# Patient Record
Sex: Male | Born: 1960 | Race: White | Hispanic: No | Marital: Married | Smoking: Former smoker
Health system: Southern US, Community
[De-identification: ages and names within clinical notes are randomized; demographics above are authoritative.]

## PROBLEM LIST (undated history)

## (undated) DIAGNOSIS — I10 Essential (primary) hypertension: Secondary | ICD-10-CM

## (undated) DIAGNOSIS — E785 Hyperlipidemia, unspecified: Secondary | ICD-10-CM

## (undated) DIAGNOSIS — E781 Pure hyperglyceridemia: Secondary | ICD-10-CM

## (undated) DIAGNOSIS — I2510 Atherosclerotic heart disease of native coronary artery without angina pectoris: Secondary | ICD-10-CM

## (undated) DIAGNOSIS — Z87442 Personal history of urinary calculi: Secondary | ICD-10-CM

## (undated) DIAGNOSIS — Z720 Tobacco use: Secondary | ICD-10-CM

## (undated) DIAGNOSIS — E119 Type 2 diabetes mellitus without complications: Secondary | ICD-10-CM

## (undated) DIAGNOSIS — F1210 Cannabis abuse, uncomplicated: Secondary | ICD-10-CM

## (undated) HISTORY — DX: Hyperlipidemia, unspecified: E78.5

## (undated) HISTORY — PX: ANGIOPLASTY: SHX39

## (undated) HISTORY — DX: Pure hyperglyceridemia: E78.1

## (undated) HISTORY — DX: Tobacco use: Z72.0

## (undated) HISTORY — DX: Atherosclerotic heart disease of native coronary artery without angina pectoris: I25.10

## (undated) HISTORY — PX: CORONARY ANGIOPLASTY: SHX604

---

## 2002-08-17 ENCOUNTER — Encounter: Payer: Self-pay | Admitting: Family Medicine

## 2002-08-17 ENCOUNTER — Ambulatory Visit (HOSPITAL_COMMUNITY): Admission: RE | Admit: 2002-08-17 | Discharge: 2002-08-17 | Payer: Self-pay | Admitting: Family Medicine

## 2003-02-03 ENCOUNTER — Encounter: Payer: Self-pay | Admitting: *Deleted

## 2003-02-03 ENCOUNTER — Emergency Department (HOSPITAL_COMMUNITY): Admission: EM | Admit: 2003-02-03 | Discharge: 2003-02-03 | Payer: Self-pay | Admitting: *Deleted

## 2003-02-05 ENCOUNTER — Ambulatory Visit (HOSPITAL_COMMUNITY): Admission: RE | Admit: 2003-02-05 | Discharge: 2003-02-05 | Payer: Self-pay | Admitting: Family Medicine

## 2003-02-05 ENCOUNTER — Encounter: Payer: Self-pay | Admitting: Family Medicine

## 2003-02-18 ENCOUNTER — Encounter (HOSPITAL_COMMUNITY): Admission: RE | Admit: 2003-02-18 | Discharge: 2003-03-20 | Payer: Self-pay | Admitting: Family Medicine

## 2003-03-22 ENCOUNTER — Encounter (HOSPITAL_COMMUNITY): Admission: RE | Admit: 2003-03-22 | Discharge: 2003-04-21 | Payer: Self-pay | Admitting: Family Medicine

## 2003-07-15 ENCOUNTER — Encounter
Admission: RE | Admit: 2003-07-15 | Discharge: 2003-10-13 | Payer: Self-pay | Admitting: Physical Medicine & Rehabilitation

## 2003-11-13 HISTORY — PX: LUMBAR SPINE SURGERY: SHX701

## 2003-11-13 HISTORY — PX: CORONARY STENT PLACEMENT: SHX1402

## 2004-03-15 ENCOUNTER — Inpatient Hospital Stay (HOSPITAL_COMMUNITY): Admission: RE | Admit: 2004-03-15 | Discharge: 2004-03-18 | Payer: Self-pay | Admitting: Neurosurgery

## 2004-04-05 ENCOUNTER — Encounter (HOSPITAL_COMMUNITY): Admission: RE | Admit: 2004-04-05 | Discharge: 2004-05-05 | Payer: Self-pay | Admitting: Neurosurgery

## 2004-05-02 ENCOUNTER — Encounter: Admission: RE | Admit: 2004-05-02 | Discharge: 2004-05-02 | Payer: Self-pay | Admitting: Neurosurgery

## 2004-05-08 ENCOUNTER — Encounter (HOSPITAL_COMMUNITY): Admission: RE | Admit: 2004-05-08 | Discharge: 2004-06-07 | Payer: Self-pay | Admitting: Neurosurgery

## 2004-06-06 ENCOUNTER — Emergency Department (HOSPITAL_COMMUNITY): Admission: EM | Admit: 2004-06-06 | Discharge: 2004-06-06 | Payer: Self-pay | Admitting: Emergency Medicine

## 2004-06-08 ENCOUNTER — Encounter (HOSPITAL_COMMUNITY): Admission: RE | Admit: 2004-06-08 | Discharge: 2004-07-08 | Payer: Self-pay | Admitting: Neurosurgery

## 2004-06-12 ENCOUNTER — Encounter: Payer: Self-pay | Admitting: Emergency Medicine

## 2004-06-12 ENCOUNTER — Inpatient Hospital Stay (HOSPITAL_COMMUNITY): Admission: AD | Admit: 2004-06-12 | Discharge: 2004-06-14 | Payer: Self-pay | Admitting: Cardiology

## 2004-06-29 ENCOUNTER — Encounter (HOSPITAL_COMMUNITY): Admission: RE | Admit: 2004-06-29 | Discharge: 2004-07-29 | Payer: Self-pay | Admitting: *Deleted

## 2004-07-18 ENCOUNTER — Ambulatory Visit (HOSPITAL_COMMUNITY): Admission: RE | Admit: 2004-07-18 | Discharge: 2004-07-18 | Payer: Self-pay | Admitting: *Deleted

## 2004-07-31 ENCOUNTER — Encounter (HOSPITAL_COMMUNITY): Admission: RE | Admit: 2004-07-31 | Discharge: 2004-08-11 | Payer: Self-pay | Admitting: *Deleted

## 2004-08-14 ENCOUNTER — Encounter (HOSPITAL_COMMUNITY): Admission: RE | Admit: 2004-08-14 | Discharge: 2004-09-13 | Payer: Self-pay | Admitting: *Deleted

## 2004-09-15 ENCOUNTER — Encounter (HOSPITAL_COMMUNITY): Admission: RE | Admit: 2004-09-15 | Discharge: 2004-10-15 | Payer: Self-pay | Admitting: *Deleted

## 2004-11-16 ENCOUNTER — Encounter: Admission: RE | Admit: 2004-11-16 | Discharge: 2004-11-16 | Payer: Self-pay | Admitting: Neurosurgery

## 2004-12-12 ENCOUNTER — Ambulatory Visit: Payer: Self-pay

## 2005-02-02 ENCOUNTER — Encounter
Admission: RE | Admit: 2005-02-02 | Discharge: 2005-05-03 | Payer: Self-pay | Admitting: Physical Medicine & Rehabilitation

## 2005-02-06 ENCOUNTER — Ambulatory Visit: Payer: Self-pay | Admitting: Physical Medicine & Rehabilitation

## 2005-02-27 ENCOUNTER — Encounter (HOSPITAL_COMMUNITY): Admission: RE | Admit: 2005-02-27 | Discharge: 2005-03-29 | Payer: Self-pay | Admitting: Neurosurgery

## 2005-03-08 ENCOUNTER — Ambulatory Visit: Payer: Self-pay | Admitting: Cardiology

## 2005-03-13 ENCOUNTER — Ambulatory Visit: Payer: Self-pay | Admitting: *Deleted

## 2005-03-30 ENCOUNTER — Encounter (HOSPITAL_COMMUNITY)
Admission: RE | Admit: 2005-03-30 | Discharge: 2005-04-29 | Payer: Self-pay | Admitting: Physical Medicine & Rehabilitation

## 2005-04-12 ENCOUNTER — Ambulatory Visit: Payer: Self-pay | Admitting: Physical Medicine & Rehabilitation

## 2005-05-03 ENCOUNTER — Ambulatory Visit (HOSPITAL_COMMUNITY): Admission: RE | Admit: 2005-05-03 | Discharge: 2005-05-03 | Payer: Self-pay | Admitting: Family Medicine

## 2005-05-21 ENCOUNTER — Ambulatory Visit: Payer: Self-pay | Admitting: Physical Medicine & Rehabilitation

## 2005-05-21 ENCOUNTER — Encounter
Admission: RE | Admit: 2005-05-21 | Discharge: 2005-08-19 | Payer: Self-pay | Admitting: Physical Medicine & Rehabilitation

## 2005-05-29 ENCOUNTER — Ambulatory Visit: Payer: Self-pay | Admitting: Cardiology

## 2005-08-31 ENCOUNTER — Ambulatory Visit: Payer: Self-pay | Admitting: *Deleted

## 2006-09-13 ENCOUNTER — Ambulatory Visit: Payer: Self-pay | Admitting: Cardiovascular Disease

## 2007-12-17 ENCOUNTER — Ambulatory Visit: Payer: Self-pay | Admitting: Cardiovascular Disease

## 2010-01-12 ENCOUNTER — Telehealth (INDEPENDENT_AMBULATORY_CARE_PROVIDER_SITE_OTHER): Payer: Self-pay | Admitting: *Deleted

## 2010-02-13 DIAGNOSIS — E781 Pure hyperglyceridemia: Secondary | ICD-10-CM | POA: Insufficient documentation

## 2010-02-13 DIAGNOSIS — F17200 Nicotine dependence, unspecified, uncomplicated: Secondary | ICD-10-CM | POA: Insufficient documentation

## 2010-02-13 DIAGNOSIS — E785 Hyperlipidemia, unspecified: Secondary | ICD-10-CM

## 2010-02-13 DIAGNOSIS — I219 Acute myocardial infarction, unspecified: Secondary | ICD-10-CM | POA: Insufficient documentation

## 2010-02-13 DIAGNOSIS — I2510 Atherosclerotic heart disease of native coronary artery without angina pectoris: Secondary | ICD-10-CM

## 2010-02-17 ENCOUNTER — Ambulatory Visit: Payer: Self-pay | Admitting: Cardiovascular Disease

## 2010-02-17 DIAGNOSIS — I10 Essential (primary) hypertension: Secondary | ICD-10-CM | POA: Insufficient documentation

## 2010-06-19 ENCOUNTER — Telehealth (INDEPENDENT_AMBULATORY_CARE_PROVIDER_SITE_OTHER): Payer: Self-pay

## 2010-10-19 ENCOUNTER — Encounter (INDEPENDENT_AMBULATORY_CARE_PROVIDER_SITE_OTHER): Payer: Self-pay | Admitting: *Deleted

## 2010-12-12 NOTE — Progress Notes (Signed)
Summary: refill  Medications Added SIMVASTATIN 40 MG TABS (SIMVASTATIN) Take one tablet by mouth daily at bedtime       Phone Note Call from Patient   Caller: Patient Reason for Call: Refill Medication Summary of Call: pt wants refill for Metoprolol 25mg  and Simvastatin 40mg  called to [facility] in [place]/pt past due for f/u/given appt. for 02/17/10/tg Initial call taken by: Raechel Ache [facility],  January 12, 2010 2:56 PM    New/Updated Medications: SIMVASTATIN 40 MG TABS (SIMVASTATIN) Take one tablet by mouth daily at bedtime Prescriptions: METOPROLOL TARTRATE 25 MG TABS (METOPROLOL TARTRATE) Take one tablet by mouth twice a day  #60 x 1   Entered by:   Teressa Lower RN   Authorized by:   Colon Branch, MD, [facility]   Signed by:   Teressa Lower RN on 01/12/2010   Method used:   Electronically to        [facility]  [address] (retail)       [address]       [place], [place]  [address]       Ph: [phone]       Fax: [phone]   RxID:   [id number] SIMVASTATIN 40 MG TABS (SIMVASTATIN) Take one tablet by mouth daily at bedtime  #30 x 1   Entered by:   Teressa Lower RN   Authorized by:   Colon Branch, MD, [facility]   Signed by:   Teressa Lower RN on 01/12/2010   Method used:   Electronically to        [facility]  [address] (retail)       [address]       [place], [place]  [address]       Ph: [phone]       Fax: [phone]   RxID:   [id number] METOPROLOL TARTRATE 25 MG TABS (METOPROLOL TARTRATE) Take one tablet by mouth twice a day  #60 x 1   Entered by:   Teressa Lower RN   Authorized by:   Colon Branch, MD, [facility]   Signed by:   Teressa Lower RN on 01/12/2010   Method used:   Print then Give to Patient   RxID:   [id number]

## 2010-12-12 NOTE — Letter (Signed)
Summary: Appointment - Reminder 2  [facility] [place]. [address] [place] [address]   Phone: [phone]  Fax: [phone]     October 19, 2010 MRN: [id number]   DURWOOD DITTUS [address] [place], [place]  [address]   Dear Mr. STROTHMAN,  Our records indicate that it is time to schedule a follow-up appointment.  Dr.   Eden Emms       recommended that you follow up with [place] in   08/2010 PAST DUE         . It is very important that we reach you to schedule this appointment. We look forward to participating in your health care needs. Please contact us at the number listed above at your earliest convenience to schedule your appointment.  If you are unable to make an appointment at this time, give [place] a call so we can update our records.     Sincerely,   Glass blower/designer

## 2010-12-12 NOTE — Progress Notes (Signed)
**Note De-Identified George [name] Obfuscation** Summary: refills-Simvastatin and Metoprolol   Phone Note Call from Patient   Reason for Call: Refill Medication Summary of Call: NEEDS REFILL ON MEDICATIONS: SIMVASTAIN 40MG ; METOPROLOT 50 MG X2 DAILY. HE FELLS LIKE [facility] IS NOT RESPONDING TO HIS REQUEST FOR THE REFILLS. HE IS OUT OF THE MEDICATION. YOU CAN CALL HIM BACK AT [phone] Initial call taken by: Alexis Goodell    Prescriptions: METOPROLOL TARTRATE 25 MG TABS (METOPROLOL TARTRATE) Take one tablet by mouth twice a day  #60 x 2   Entered by:   Larita Fife Jalayne Ganesh LPN   Authorized by:   Colon Branch, MD, [facility]   Signed by:   Larita Fife Lulla Linville LPN on 09/81/1914   Method used:   Electronically to        [facility]  [address] (retail)       [address]       [place], [place]  [address]       Ph: [phone]       Fax: [phone]   RxID:   [id number] SIMVASTATIN 40 MG TABS (SIMVASTATIN) Take one tablet by mouth daily at bedtime  #30 x 2   Entered by:   Larita Fife Lanika Colgate LPN   Authorized by:   Colon Branch, MD, [facility]   Signed by:   Larita Fife Tyrea Froberg LPN on 40/34/7425   Method used:   Electronically to        [facility]  [address] (retail)       [address]       [place], [place]  [address]       Ph: [phone]       Fax: [phone]   RxID:   [id number]

## 2010-12-12 NOTE — Assessment & Plan Note (Signed)
Summary: PAST DUE FOR F/U PER PT PHONE CALL/TG  Medications Added FISH OIL 500 MG CAPS (OMEGA-3 FATTY ACIDS) take 1 cap three times a day      Allergies Added: NKDA  Visit Type:  Follow-up Primary Provider:  Dr.Mcgough  CC:  sob with excertion.  History of Present Illness: He has had a history of coronary artery disease.  He has had stenting ofthe distal right coronary by Dr. Juanda Chance 2005.  He is disabled unemployed.  His back is still a problem and he has gained a lot of weight.  He has had to take nitro twice in 8 months.  He is paying for his meds at [facility].  He is caring for his 75 yo step son which fills up his day.  His wife works as a Child psychotherapist at [employer] and that is his sole income  Current Problems (verified): 1)  Cad  (ICD-414.00) 2)  Hypertriglyceridemia  (ICD-272.1) 3)  Dyslipidemia  (ICD-272.4) 4)  Tobacco Abuse  (ICD-305.1) 5)  AMI  (ICD-410.90) 6)  Hyperlipidemia  (ICD-272.4)  Current Medications (verified): 1)  Metoprolol Tartrate 25 Mg Tabs (Metoprolol Tartrate) .... Take One Tablet By Mouth Twice A Day 2)  Simvastatin 40 Mg Tabs (Simvastatin) .... Take One Tablet By Mouth Daily At Bedtime 3)  Aspir-Trin 325 Mg Tbec (Aspirin) .... Take 1 Tab Daily 4)  Prilosec 40 Mg Cpdr (Omeprazole) .... Take 1 Tab Daily 5)  Vicodin 5-500 Mg Tabs (Hydrocodone-Acetaminophen) .... Take As Directed For Pain 6)  Nitrostat 0.4 Mg Subl (Nitroglycerin) .... Take 1 Tab Daily 7)  Fish Oil 500 Mg Caps (Omega-3 Fatty Acids) .... Take 1 Cap Three Times A Day  Allergies (verified): No Known Drug Allergies  Past History:  Past Medical History: Last updated: 02/13/2010 Current Problems:  CAD (ICD-414.00) HYPERTRIGLYCERIDEMIA (ICD-272.1) DYSLIPIDEMIA (ICD-272.4) TOBACCO ABUSE (ICD-305.1) AMI (ICD-410.90) HYPERLIPIDEMIA (ICD-272.4)  Past Surgical History: Last updated: 02/13/2010 LUMBAR INJURY REQUIRING SURGERY MYOCARDIAL INFARCTION WITH TAXUS STENT TO THE RIGHT CORONARY  ARTERY CARDIAC CATH ANGIOPLASTY  Social History: Last updated: 02/17/2010 Tobacco Use - Yes.  Married  Disabled Sedentary Nonsmoker  Social History: Tobacco Use - Yes.  Married  Disabled Sedentary Nonsmoker  Review of Systems       Denies fever, malais, weight loss, blurry vision, decreased visual acuity, cough, sputum, SOB, hemoptysis, pleuritic pain, palpitaitons, heartburn, abdominal pain, melena, lower extremity edema, claudication, or rash.   Vital Signs:  Patient profile:   50 year old male Height:      71 inches Weight:      256 pounds BMI:     35.83 Pulse rate:   70 / minute BP sitting:   134 / 80  (right arm)  Vitals Entered By: Dreama Saa, CNA (February 17, 2010 10:25 AM)  Physical Exam  General:  Affect appropriate Healthy:  appears stated age HEENT: normal Neck supple with no adenopathy JVP normal no bruits no thyromegaly Lungs clear with no wheezing and good diaphragmatic motion Heart:  S1/S2 no murmur,rub, gallop or click PMI normal Abdomen: benighn, BS positve, no tenderness, no AAA no bruit.  No HSM or HJR Distal pulses intact with no bruits No edema Neuro non-focal Skin warm and dry    Impression & Recommendations:  Problem # 1:  CAD (ICD-414.00) Stable continue medical Rx His updated medication list for this problem includes:    Metoprolol Tartrate 25 Mg Tabs (Metoprolol tartrate) .[name]... Take one tablet by mouth twice a day    Aspir-trin 325  Mg Tbec (Aspirin) .[name]... Take 1 tab daily    Nitrostat 0.4 Mg Subl (Nitroglycerin) .[name]... Take 1 tab daily  Problem # 2:  DYSLIPIDEMIA (ICD-272.4) Lipid and liver per primary His updated medication list for this problem includes:    Simvastatin 40 Mg Tabs (Simvastatin) .[name]... Take one tablet by mouth daily at bedtime  Problem # 3:  ESSENTIAL HYPERTENSION, BENIGN (ICD-401.1) Well controlled His updated medication list for this problem includes:    Metoprolol Tartrate 25 Mg Tabs (Metoprolol  tartrate) .[name]... Take one tablet by mouth twice a day    Aspir-trin 325 Mg Tbec (Aspirin) .[name]... Take 1 tab daily  Patient Instructions: 1)  Your physician recommends that you schedule a follow-up appointment in: 6 months 2)  Your physician recommends that you continue on your current medications as directed. Please refer to the Current Medication list given to you today.   EKG Report  Procedure date:  02/17/2010

## 2011-01-25 ENCOUNTER — Telehealth: Payer: Self-pay

## 2011-01-25 ENCOUNTER — Encounter: Payer: Self-pay | Admitting: Cardiovascular Disease

## 2011-01-30 NOTE — Letter (Signed)
Summary: Generic Letter  [facility] [place]  [address], [place] [address]   Phone: [phone]  Fax: [phone]        January 25, 2011 MRN: [id number]    George [name] [address] [place], [place]  [address]    Dear Mr. GRANIER,    We received a telephone call from you concerning a refill on   Metoprolol. We have attempted to reach you multiple times, at both   phone numbers you provided with this office, without success. When you   receive this letter please contact us at [phone].         Sincerely,  Larita Fife Via LPN  This letter has been electronically signed by your physician.

## 2011-01-30 NOTE — Progress Notes (Signed)
Summary: RX REFILL   Phone Note Call from Patient Call back at [phone]   Caller: PT Reason for Call: Refill Medication Summary of Call: PT NEEDS METROPROLOL CALLED IN TO [facility] LAST RX HE HAD FILLED WAS 2010. Initial call taken by: Faythe Ghee,  January 25, 2011 8:47 AM  Follow-up for Phone Call        Attempted to reach pt. at both phone numbers provided but both have been disconnected. Pt. is overdue for f/u and needs to schedule. Also, it is unclear if pt. has been taking Metoprolol or if he has been out for a while and wants to resume. Front office advised that if pt. calls back to put call through to nurse. Will mail letter to pt's address asking him to contact this office. Follow-up by: Larita Fife Via LPN,  January 25, 2011 10:00 AM

## 2011-03-27 NOTE — Assessment & Plan Note (Signed)
[facility]                       [place] [facility] OFFICE NOTE   George [name], George [name]                         MRN:          [id number]  DATE:12/17/2007                            DOB:          06-27-1961    Mak returns today for follow-up.   I have seen him once before.  He is a previous patient of Dr. Dorethea Clan.   He has had a history of coronary artery disease.  He has had stenting of  the distal right coronary by Dr. Juanda Chance 2005.  He is disabled  unemployed.  He only takes his medicines when he can get free samples  primarily from Dr. Edison Simon office.  He is about to have been a change  in his insurance.  He may be able to get medicines through a free  clinic.  I told him we would switch over to generic Lopressor and  simvastatin.  He is currently on Toprol, Lipitor.   He is not having exertional chest pain, PND, orthopnea.  Activity  limited by chronic back pain.  Apparently he broke his back in a work  related incident and recovered a settlement from it.  The patient quit  smoking back in 2005.   Unfortunately again because of his financial situation, he does not want  to have a stress test.  He has also been fairly noncompliant with his  meds.   He has issues with erectile dysfunction with his beta blocker and really  does not want to take it.  In fact, I am not sure how, but he is taking  Cialis.  Apparently has a very young girlfriend and is more interested  in this than protecting his heart with a beta blocker.  We had a bit of  a discussion about this and I am not sure Jeremaine is going to change his  mind.  I told him I should not could certainly not prescribed in the  Cialis.   His review of systems otherwise is remarkable for chronic back pain.   His meds will be Lopressor 25 b.i.d. and pravastatin over simvastatin 40  a day an aspirin a day.   EXAM:  Is remarkable for a healthy-appearing middle-aged white male in  no distress.  He  has a cane.  Weight is 238, blood pressure 114/70, pulse 62 and regular, afebrile,  respiratory rate 14.  HEENT:  Unremarkable.  Carotids are without bruit.  No lymphadenopathy,  thyromegaly, JVP elevation.  LUNGS:  Clear diaphragmatic was no wheezing.  S1-S2 normal heart sounds.  PMI normal.  ABDOMEN:  Benign bowel sounds positive bruit.  No edema or nonfocal.  SKIN:  Warm and dry chronic back pain.   IMPRESSION:  1. Coronary disease, previous stenting the right coronary artery      continue aspirin and beta-blocker.  2. Hypercholesterolemia setting of coronary disease.  Continue      simvastatin lipid liver profile in 6 months.  3. Chronic back pain.  Follow-up with Dr. Regino Schultze.  I believe he takes      valium and Percocet.  4. History of reflux.  Continue Prilosec over-the-counter.  5. Erectile dysfunction.  I told him a better option would be to limit      beta-blocker use on the days he plans to have sexual intercourse.      Again, I am somewhat reluctant to believe that he will take his      beta blocker and I suspect he will continue to take his Cialis      despite his coronary disease.   We will see him back in 6 months.  I did congratulate him on continued  to abstain from smoking.     Noralyn Pick. Eden Emms, MD, [facility]  Electronically Signed    PCN/MedQ  DD: 12/17/2007  DT: 12/18/2007  Job #: [id number]

## 2011-03-30 NOTE — Assessment & Plan Note (Signed)
DATE OF SERVICE:  May 22, 2005.   MEDICAL RECORD NUMBER:  [id number].   [name] is a 50 year old male who I saw initially on July 16, 2003.  He had a work-related injury on March 10, 2003.  He had lumbar pain,  disk space narrowing and minimal bulging on MRI.  I set him up for bilateral  L4-5 facet injection x 2.  Then he followed up with Dr. Stevphen Rochester and had a  transforaminal epidural injection at S1.  He was then lost to followup after  he had a second surgical opinion and underwent L5-S1 fusion in May of 2005.  I did not see him back until February 06, 2005.  He also had an MI in August of  2005.  We restarted him in physical therapy.  Did not feel any further  injections would help.  He was taking some hydrocodone at the time.  We  changed him over the tramadol and Lyrica.  He had a positive UDS for  marijuana, but a second UDS was negative.  He had been getting physical  therapy at [facility], but he discontinued this after he states he  tore a back muscle about three weeks ago.  He brings in pictures which show  his torso and showed some ecchymosis in various stages.  He states he had a  CT scan ordered by his primary care physician.  In addition, his primary  care physician ordered Percocet and the patient understands that is a  violation of his controlled substance agreement.   He states his pain level is overall improving following that flare up.  His  pain diagram shows right lower extremity pain, as well as back pain  averaging 5-6/10.  The pain interference score is general activity 7,  relationships with other people 8 and enjoyment of life 8.  The pain  improves with rest.  He can ambulate for 15-30 minutes.  He is able to climb  steps and drive.   REVIEW OF SYSTEMS:  Positive for anxiety, depression, easy bleeding,  diarrhea and night sweats.   PHYSICAL EXAMINATION:  VITAL SIGNS:  Blood pressure 162/90.  He states that  his primary care physician is  taking care of that.  BACK:  No evidence of ecchymosis.  He has midline scar which is nontender.  He has pain with even very light palpation in his lower back, right side and  left side equal.  His range of motion is about 25% forward flexion,  extension, lateral rotation and bending.  EXTREMITIES:  His deep tendon reflexes are normal.  Strength is normal in  bilateral lower extremities.  Range of motion normal in bilateral  extremities.   He had a CT of the chest dated May 03, 2005, which was normal.   IMPRESSION:  1.  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  2.  Back strain.  He told me that his CT showed internal bleeding, but CT      dated May 03, 2005, ordered by Dr. Regino Schultze did not show any of this.      The patient showed me some ecchymosis which appeared to be above the      iliac crest.  It was a torso shot only.   RECOMMENDATIONS:  1.  I think he has met maximum medical improvement from a rehabilitation      standpoint.  I do not think that any further physical therapy would be  helpful for him.  Therefore, I think he is ready to be rated.  I have      previous conversation with Dr. Wynetta Emery and he has offered to do so and the      patient has an appointment in two days.  2.  Given violation of the narcotic substance agreement, I think he should      get his medication management from      his primary physician.  3.  I have recommended continued walking.  Would recommend FCE to further      define capabilities.       AEK/MedQ  D:  05/22/2005 10:01:31  T:  05/22/2005 11:32:25  Job #:  [id number]   cc:   Mickle Asper, Senior Case Manager  [employer]  [phone]

## 2011-03-30 NOTE — Assessment & Plan Note (Signed)
DATE OF SERVICE:  Mar 12, 2005.   MEDICAL RECORD NUMBER:  [id number].   A 50 year old male with a history of lumbar pain.  Had a work injury dated  January 26, 2003, moving a 1500 gallon tank.  He had undergone L5-S1 fusion in  May of 2005.  He has had neurosurgical followup and the fusion appears  solid.  In May of 2005, he had an MI.  Cardiac precautions are less than 120  beats per minute.   At last visit, checked urine drug screen, which was positive for marijuana.  He has submitted another sample today.  Discussed with the patient the need  for clean urine prior to initiating C2 or C3 substance.  Had had previous  moderate pain relief with Norco.  States that tramadol has been making him  sweat and has had some nausea.   He has started physical therapy.  His walking time is up to 15 minutes.  He  is able to climb steps and drive.  He uses a cane mainly on uneven surfaces.  He states his pain is about a 6/10 on average now.  Pain interference scores  are general activity 7, relationships with others 8 and enjoyment of life 9.  He complains of decreased libido on the Ultram.  He sleeps poorly.  His  other review of systems is trouble walking, spasms and depression, but no  suicidal ideation.   He is single.  He lives with his son and daughter-in-law.   PHYSICAL EXAMINATION:  VITAL SIGNS:  Blood pressure 142/76, pulse 62,  respirations 16 and O2 saturation 100% on room air.  GENERAL APPEARANCE:  No acute distress.  Mood and affect appropriate.  BACK:  No tenderness to palpation.  NEUROLOGIC:  He is able to ambulate with and without a cane.  He does favor  his right leg, but there is no evidence of toe drag or knee instability.   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  He  has had previous S1 transforaminals really without much effect and previous  medial branch blocks without much effect.  He is benefitting from PT and his  pain scores have come down  some.   PLAN:  1.  Would continue physical therapy x 1 more month.  2.  Initiate Lyrica 75 mg b.i.d. x 1 week and then increase to 150 mg b.i.d.      Looking over his medications, I do not see any contraindications.  3.  Discontinue tramadol.  4.  Consider reinitiation of hydrocodone versus a longer acting medication      should his urine drug screen be clean.  5.  If he is making progress in therapy at the next visit, would consider      initiating some light duty employment.      Would defer on rating to neurosurgery.  I think from a rehabilitation      medicine standpoint, anticipate MMI somewhere around eight weeks from      now.      AEK/MedQ  D:  03/12/2005 14:24:49  T:  03/12/2005 15:47:45  Job #:  [id number]   cc:   Donalee Citrin, M.D.  [address]  [place]  [place] [address]  Fax: [phone]   Ginger Akers  [address]  [place], [place] [address]

## 2011-03-30 NOTE — Discharge Summary (Signed)
NAME:  KONGMENG, SANTORO                            ACCOUNT NO.:  [id number]   MEDICAL RECORD NO.:  [id number]                   PATIENT TYPE:  INP   LOCATION:  2930                                 FACILITY:  [facility]   PHYSICIAN:  [place] Bing, M.D.               DATE OF BIRTH:  10/27/1961   DATE OF ADMISSION:  06/12/2004  DATE OF DISCHARGE:  06/14/2004                                 DISCHARGE SUMMARY   PROCEDURES:  1. Cardiac catheterization.  2. Coronary arteriogram.  3. Left ventriculogram.  4. Percutaneous transluminal coronary angioplasty and stent to 1 vessel.   HOSPITAL COURSE:  [name] is a 50 year old male with no known history of  coronary artery disease.  He went to [facility] [place] after having chest  pain on and off for 3 or 4 weeks.  His chest pain started on the day of  admission and went down both arms and up into his neck.  He had multiple  associated symptoms including nausea, vomiting and diaphoresis.  He went to  [facility], where his point-of-care markers were elevated and he  was transported to [facility] for further evaluation and treatment.   Mr. Flanigan ruled in for an MI and it was felt that cardiac catheterization  was indicated to further evaluate his coronary anatomy.  This was performed  on June 13, 2004.  The cardiac catheterization showed a 95% RCA as the  culprit vessel, which was treated with PTCA and a TAXUS stent, reducing the  stenosis to 0% with TIMI-3 flow.  He had other lesions of 40% in the RCA as  well as in a branch of the circumflex and a 50% and a 70% stenosis in the  LAD.  The circumflex also had a 90% distal stenosis.  He had inferior  hypokinesis with an EF of 60%.  He tolerated the procedure well.   The next day, his enzymes were continuing to trend down.  His cath site was  without hemorrhage or hematoma.  He had a lipid profile performed which  showed a total cholesterol of 282, triglycerides 692, HDL 26 and  the LDL was  not calculated.  He had been started on Lipitor 40 mg a day.  Mr. Reinhardt was  also enrolled in the TRITON Study and is receiving medication from the  [facility].  Pending evaluation by Dr. Arturo Morton. Stuckey, Mr.  Wale is to be seen by cardiac rehab.  He has chronic back problems and so  his exercise capacity is limited, but we will refer him to outpatient  cardiac rehab.  Pending evaluation by the physician, Mr. Wogan is  tentatively considered stable for discharge on June 14, 2004.   DISCHARGE CONDITION:  Improved.   DISCHARGE DIAGNOSES:  1. Acute inferior myocardial infarction, status post TAXUS stent to the     right coronary artery.  2. Hyperlipidemia  with hypertriglyceridemia and dyslipidemia with a low HDL.  3. History of tobacco use.  4. Family history of coronary artery disease.  5. History of lumbar injury with surgery and ongoing back pain.   DISCHARGE INSTRUCTIONS:  1. His activity level is to include no strenuous activity till cleared by     M.D.  2. He is to stick to a low-fat diet.  3. He is to call our office for problems with the cath site.  4. He is not to use tobacco.  5. He is to follow up with Jae Dire, P.A. for Dr. Vida Roller on     June 28, 2004 at 2 p.m. and he is to follow up with Dr. Kirk Ruths as needed.   DISCHARGE MEDICATIONS:  1. Aspirin 325 mg daily.  2. Toprol-XL 25 mg daily.  3. Lipitor 40 mg daily.  4. Prilosec OTC or Protonix 40 mg daily.  5. Vicodin and Valium, and nitroglycerin as needed.  6. TRITON Study medication as directed.      Theodore Demark, P.A. [facility]                  Globe Bing, M.D.    RB/MEDQ  D:  06/14/2004  T:  06/14/2004  Job:  [id number]   cc:   Kirk Ruths, M.D.  [address]  [place]  [place] [address]  Fax: [phone]   Vida Roller, M.D.  Fax: [phone]

## 2011-03-30 NOTE — Discharge Summary (Signed)
NAME:  George [name], SHOUGH NO.:  [id number]   MEDICAL RECORD NO.:  [id number]                   PATIENT TYPE:  INP   LOCATION:  3003                                 FACILITY:  [facility]   PHYSICIAN:  Donalee Citrin, M.D.                     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   PROCEDURE:  Decompressive laminectomy, posterior lumbar interbody fusion L5-  S1.   SURGEON:  Donalee Citrin, M.D.   Threasa HeadsYetta Barre.   HOSPITAL COURSE:  The patient was admitted __________ went to the operating  room and performed the above procedure.  Postoperatively the patient did  very well, went to the recovery room then on the floor.  On the floor, was  afebrile with stable vital signs.  Doing rather well, neurologically stable  with full strength in this lower extremities.  The patient progressively  mobilized over the next couple of days.  Pain was well controlled on p.o.  Percocet and Valium and OxyContin.  The patient ambulated with physical  therapy well and by day #3, was ambulating and voiding spontaneously,  tolerating a regular diet and able to be discharged home.   FOLLOW UP:  The patient is discharged with follow-up in two weeks.                                                Donalee Citrin, M.D.    GC/MEDQ  D:  04/21/2004  T:  04/21/2004  Job:  [id number]

## 2011-03-30 NOTE — Assessment & Plan Note (Signed)
[facility]                              CARDIOLOGY OFFICE NOTE   FENRIS, CAUBLE                         MRN:          [id number]  DATE:09/13/2006                            DOB:          [birth year]    Mr. Pella returns today for followup.  He has had previous stenting of his  distal right by Dr. Juanda Chance in 2005.  Unfortunately, he is disabled and  unemployed.  He does not take his medicines all the time.   He has been getting samples of Lipitor from Dr. Edison Simon office.   He has not been on Plavix due to the cost.  He is taking an aspirin a day.  He had an episode of chest pain October 11, which resolved spontaneously,  and was like his previous MI.   Unfortunately, since he does not have health insurance, he really does not  want to have a stress test.  Currently he looks good.   PHYSICAL EXAMINATION:  His blood pressure is 120/70, pulse is 70 and  regular.  LUNGS:  Clear.  Carotids are normal.  There is an S1, S2 with normal heart sounds.  ABDOMEN:  Benign.  Lower extremities with intact pulses, no edema.  NEUROLOGIC:  Nonfocal.  SKIN:  Warm and dry.  There is no lymphadenopathy.  HEENT:  Normal.   Of note, the patient has an uncanny likeness to Thayer Dallas.   MEDICATIONS:  Are supposed to be:  1. Lipitor 40 mg a day.  2. Toprol 25 mg a day.  3. Aspirin a day.   IMPRESSION:  Mr. Goldston has single-vessel coronary disease.  He had an  isolated episode of chest pain.  He is otherwise active.  I refilled his  prescriptions for sublingual nitroglycerin.  He knows to call us if he would  have to take more than one of these.   We will get him some samples of Lipitor and Crestor, or whatever we have in  the office.  He will also see Dr. Regino Schultze for this.  I gave him a  prescription for Plavix and told him even if he took it every third day,  this would help.  We will see him back in 6 months.   ______________________________  Noralyn Pick.  Eden Emms, MD, [facility]   PCN/MedQ  DD: 09/13/2006  DT: 09/13/2006  Job #: [id number]

## 2011-03-30 NOTE — Procedures (Signed)
NAME:  [name], [name] NO.:  [id number]   MEDICAL RECORD NO.:  [id number]                   PATIENT TYPE:  REC   LOCATION:  CREH                                 FACILITY:  [facility]   PHYSICIAN:  Vida Roller, M.D.                DATE OF BIRTH:  02/16/1961   DATE OF PROCEDURE:  DATE OF DISCHARGE:                                    STRESS TEST   HISTORY:  George [name] is a 50 year old gentleman with recent non-ST elevation  MI on June 13, 2004, treated with a Taxus stent to the RCA, residual  stenosis includes 30% proximal and 30% distal LAD, 90% distal posterolateral  branch of the circumflex, 40% proximal and 40% mid RCA.  He had an ejection  fraction of 60% at that time.  He continues to have chest discomfort.   BASELINE DATA:  EKG reveals sinus rhythm at 72 beats per minute with  nonspecific ST abnormalities.  Blood pressure is 138/80.   DESCRIPTION OF PROCEDURE:  Adenosine 56 mg was infused over four minute  protocol with Cardiolite injected at three minutes.  The patient reported  chest discomfort, headache and flushing which resolved in recovery. EKG  revealed no arrhythmias and no ischemic changes.  Test protocol was  complete.   Final images and results are pending M.D. review.     ________________________________________  ___________________________________________  Jae Dire, P.A. [facility]                      Vida Roller, M.D.   AB/MEDQ  D:  07/18/2004  T:  07/18/2004  Job:  [id number]

## 2011-03-30 NOTE — Assessment & Plan Note (Signed)
HISTORY OF PRESENT ILLNESS:  A 50 year old male with a work injury dated  back to January 26, 2003, moving 1500 gallon tanks which fell on top of him in  [place].  I initially saw him August 12, 2003.  The patient was lost  to my follow up.  Sometime he had medial branch blocks, and in October 2005  to February 06, 2005, with interval history of having undergone a lumbar  laminectomy, L5-S1, with posterior lumbar inner body fusion L5-S1, Allograft  wedges, pedicle screw, harvested Autograft.  He had an MI in August 2005,  approximately three months postop.  He was stented and placed in cardiac  rehabilitation.   Since I last saw him, he had another urine drug screen testing to follow up  on Mar 12, 2005, to follow up on a testing done February 07, 2005, and both of  these showed positive marijuana, also, positive benzodiazepine.   Reviewed PT notes making some moderate progress stands 30 minutes at a time  and walking a mile a day.  He states he has about three bad days a week and  3-4 good days.  His PT is at [facility].   Pain average is about 6/10.  Sleep is poor.  Medications given him about 50%  relief.  His pain diagram shows right lower extremity pain, as well as, low  back pain.   CURRENT PAIN MEDICATIONS:  1.  Ultram 50 mg two p.o. t.i.d.  He states that he only got mild relief      with this.  2.  Lyrica 150 p.o. b.i.d.   REVIEW OF SYSTEMS:  Neuropsych is positive for depression/anxiety, but no  suicidal thoughts.   PHYSICAL EXAMINATION:  VITAL SIGNS:  Blood pressure 130/92, pulse 69,  respirations 16, O2 saturation 100% on room air.  GENERAL APPEARANCE:  No acute distress.  Alert and oriented x3.  Gait is  antalgic.  Uses a cane, although, he can walk without a cane as well.  He  has good range of motion bilateral hips, knees and ankles.  Negative  straight leg raising.  Good deep tendon reflexes all four extremities.  Good  strength in bilateral lower extremities, as  well as, upper extremities.  BACK:  Range of motion extremely limited, about 25% forward flexion,  extension, lateral changes from bending, guarding and superior avoidance.   IMPRESSION:  1.  Lumbar pain, lumbar post laminectomy syndrome approximately one year      post operative.  2.  History of CAD status post stenting.   PLAN:  I think one more month of physical therapy at 2-3 times a week is  appropriate, and at that point, he should be at MMI barring no unforeseen  complications.  At that point, I would recommend FCE, and he will probably  require some vocational rehab.  I will defer an MI rating to Dr. Donalee Citrin  in this case.  Would Continue current medications of Ultram 100 mg t.i.d., Lyrica 150  b.i.d., Flexeril 5 t.i.d.  If UDS negative for  marijuana metabolites, consider a nighttime dosage of a narcotic analgesic  to help with sleep.  I will see him back in one month.      AEK/MedQ  D:  04/12/2005 16:57:56  T:  04/13/2005 06:18:25  Job #:  [id number]   cc:   Donalee Citrin, M.D.  [address]  [place]  [place] [address]  Fax: [phone]   Allayne Butcher, Case Manager  Fax [phone]   Kirk Ruths, M.D.  [address]  [place]  [place] [address]  Fax: [phone]

## 2011-03-30 NOTE — Op Note (Signed)
NAMEDUILIO, HERITAGE NO.:  [id number]   MEDICAL RECORD NO.:  [id number]                   PATIENT TYPE:  INP   LOCATION:  2869                                 FACILITY:  [facility]   PHYSICIAN:  Donalee Citrin, M.D.                     DATE OF BIRTH:  [birth year]   DATE OF PROCEDURE:  03/15/2004  DATE OF DISCHARGE:                                 OPERATIVE REPORT   PREOPERATIVE DIAGNOSIS:  Severe degenerative disk disease, L5-S1 with  bilateral S1 radiculopathies, spondylolysis L5-S1 on the right and  diskogenic mechanical instability.   POSTOPERATIVE DIAGNOSIS:  Severe degenerative disk disease, L5-S1 with  bilateral S1 radiculopathies, spondylolysis L5-S1 on the right and  diskogenic mechanical instability.   OPERATION PERFORMED:  Decompressive laminectomy, L5-S1; posterior lumbar  interbody fusion, L5-S1 with  10 x 26 mm tangent allograft wedges; pedicle  screw fixation, L5-S1 using Legacy M10 pedicle screw system; posterior  lumbar interbody fusion L5-S1 using locally harvested autograft.  Placement  of medium Hemovac drain.   SURGEON:  Donalee Citrin, M.D.   ASSISTANT:  Tia Alert, MD   ANESTHESIA:  General endotracheal.   INDICATIONS FOR PROCEDURE:  Patient is a very pleasant 50 year old gentleman  who had sustained a work related injury and suffered severe back and  bilateral leg pain.  It has gotten progressively worse over the last several  months, refractory to all forms of conservative treatment.  Patient reports  predominantly right greater than left leg pain but pain was much worse in  his back, worse when going from lying to sitting and sitting to standing  position.  Preoperative imaging showed annular tears and deterioration of  the disk space and central disk bulge irritating both S1 nerve roots as well  as spondylolysis.  The patient had undergone preoperative MRIs as well as  diskography which did show a concordant response at the L5-S1  disk space.  Due to patient's failure of conservative treatment and his preoperative  imaging and clinical history and exam, patient was recommended decompressive  stabilization procedure.  I extensively went over the risks and benefits of  surgery with him and he understands and agrees to proceed forward.   DESCRIPTION OF PROCEDURE:  The patient was brought to the operating room  where he was induced under general anesthesia, he was positioned prone on  the Wilson frame.  Back was prepped and draped in the usual sterile fashion.  After infiltrating with 10 mL lidocaine with epinephrine, Bovie  electrocautery was used to __________  subperiosteal dissection carried out  to lamina of L5 and S1.  The transverse processes of L5 and S1 were exposed,  confirmed by fluoroscopy and then using high speed drill, the medial aspect  of facet complexes were drilled down.  Then using Leksell rongeur the entire  spinous process and lamina and medial facet  complexes of L5 and S1 were  removed.  There was noted to be a pars defect on the right at L5-S1 with a  lot of granulation and inflammatory tissue around it and the facets were  noted to be markedly diastased at this site as well.  After the lamina was  removed and the L5 and S1 nerve roots were identified on each side, each  nerve root was rapidly decompressed out its neural foramen.  Then attention  was taken to pedicle screw placement.  Using high speed drill pilot holes  drilled at L5 on the left, cannulated with the awl, probed from within the  pedicle as well as within the canal.  Tapped with 5.5 tap and 6 x 40 pedicle  screw inserted at L5 on the left and 6 x 35 inserted at S1 on the left in  similar fashion.  Same procedure repeated on the right at L5 and S1.  All  pedicle screws had excellent purchase.  They were all probed from within the  pedicle and noted to be competent without lateral breech and from within the  canal confirming no  medial breech.  After all four pedicles were placed,  D'Errico nerve root retractor was used to reflect the right S1 nerve root  medially, epidural vein was coagulated, annulotomy was made, pituitary  rongeurs were used to clean out the disk space.  Then size 10 distractor was  inserted.  This had good apposition of the end plates and felt to be the  proper size for the graft.  Then distractor left in place.  The left S1  nerve root was inspected medially.  An annulotomy was made.  Disk space was  adequately cleaned out.  Several large fragments of disk were removed from  the __________  compartment initially on the left side as well as they had  been on the right.  Then, using a size 10 cutter and chisel, the end plates  were scraped and prepared to receive bone graft, fluoroscopy confirming each  step and trajectory each step along the way.  Then a 10 x 26 mm tangent  allograft was inserted on the left side 2 to 3 mm deep to the posterior  vertebral body line.  Then on the right side again, downgoing Epstein curet  was used to scrape the central disk away from the thecal sac decompressing  the central canal.  Then pituitary rongeurs were used to clean up the disk  space.  A size 10 cutter and chisel were used to prepare the end plates.  Locally harvested allograft was packed against the left-sided allograft and  the right-sided allograft was inserted in a similar fashion.  Again  fluoroscopy confirmed good position of the screws and bone graft.  Then  aggressive irrigation and decortication was carried out in the lateral  transverse processes and lateral gutters.  At L5-S1 the remainder of the  locally harvested autograft was packed in the lateral gutters and along the  TPs. Then a 40 mm rod was sized, selected and inserted.  Top-tightening nuts  were tightened down at S1.  L5 pedicle screw was compressed against S1.  One  of the new cross links was inserted at this level as well.  Gelfoam  was overlaid atop the dura.  The neural foramina were re-explored with a hockey  stick and coronary dilator and noted to have no further stenosis and the  canal was noted to be widely patent.  Then a  medium Hemovac drain was  placed.  The muscle and fascia were reapproximated with 0 interrupted  Vicryl, subcutaneous tissue closed with 2-0 interrupted Vicryl, skin closed  with a running 4-0 subcuticular.  Benzoin and Steri-Strips applied.  The  patient was then transferred to the recovery room in stable condition.  At  the end of the case, sponge and needle counts were correct.                                               Donalee Citrin, M.D.    GC/MEDQ  D:  03/15/2004  T:  03/16/2004  Job:  [id number]

## 2011-03-30 NOTE — Assessment & Plan Note (Signed)
MEDICAL RECORD NUMBER:  [id number].   HISTORY:  A 50 year old male with history of lumbar pain. He had a work  injury date January 26, 2003 moving a 1,500 gallon tank which fell on top of  him in [place]. Please refer to my initial evaluation July 16, 2003.  His interval history is significant for a surgical second opinion and  subsequent L5-S1 fusion in May of 2005. He has per his report not received  any physical therapy since the fusion and has been told that his fusion  appears solid. He has had EMG per Dr. Murray Hodgkins to further evaluate right lower  extremity pain and numbness which was reportedly normal. His surgery date  was Mar 15, 2004 with decompression laminectomy L5-S1, posterior lumbar  interbody fusion L5-S1, allograft wedges, pedicle screw, harvested  autograft.   Other subsequent medical history August of 2005 had a MI. He was stented,  placed in cardiac rehab.   His current pain is 8/10, averaging 6/10. Pain interference score:  General  activity 8, relationships with others 9, general life 9. Relief from  medications 5/10.   He can walk 5 minutes at a time, intermittently uses a cane, able to climb  steps and continues to drive. He drives 25 miles to here. He states he needs  assistance for shopping and household duties and some weakness and numbness  in the lower extremities, depression anxiety but no suicidal thoughts. No  bowel or bladder incontinence.   SOCIAL HISTORY:  He has been divorced from his wife since I last saw him in  October of 2004.   PHYSICAL EXAMINATION:  Blood pressure 162/89, pulse 60, respirations 16, O2  saturation 98% on room air.   GENERAL:  No acute distress. Mood and affect appropriate. He does have some  exagerrated pain behaviors with seated quad testing. Also with range of  motion testing of lumbar spine which is approximately 25% of normal range.  His range of motion of lower extremities is normal in the hips, knees, and  ankles. He  has full strength bilateral upper and lower extremities, very  guarded in his motions. His sensation is reduced right S1 dermatome. He had  normal deep tendon reflexes, bilateral lower extremity. Remainder of  neurovascular exam is normal except for guarded gait, but no evidence of toe  drag or knee instability.   IMPRESSION:  Lumbar pain, post laminectomy syndrome.   PLAN:  1.  I feel that he can undergo some physical therapy at this point but would      like to get some cardiac precautions from his cardiologist.  2.  I do not think any further injection therapy would be particularly      beneficial in this case.  3.  The patient is a fairly modest dose of hydrocodone three to four tablets      a day. He has been taking off his Valium and placed on Lexapro which I      think is a good development as well.  4.  I do not think any further significant major medical adjustments need to      be done. If he does not tolerate the Lexapro, could trial Cymbalta but      will leave this up to Dr. Regino Schultze. Other medications that might be      helpful would be Lyrica.  5.  I will see him back in one month to see how he progresses.  6.  Once PT progress has maximized  then will likely need FCE and Voc rehab      referral.      AEK/MedQ  D:  02/06/2005 11:58:35  T:  02/06/2005 13:08:45  Job #:  [id number]   cc:   Kirk Ruths, M.D.  [address]  [place]  [place] [address]  Fax: [phone]   Vida Roller, M.D.  Fax: [phone]

## 2011-03-30 NOTE — Op Note (Signed)
NAME:  [name], George NO.:  [id number]   MEDICAL RECORD NO.:  [id number]                   PATIENT TYPE:  REC   LOCATION:  [facility]PC                                  FACILITY:  [facility]   PHYSICIAN:  Celene Kras, MD                     DATE OF BIRTH:  01-May-1961   DATE OF PROCEDURE:  DATE OF DISCHARGE:                                 OPERATIVE REPORT   George [name] is a very pleasant patient, referred to [place] by Dr. Wynn Banker.  He  is a pleasant individual complaining of right leg pain, that resulted from  moving a 1500 gallon tank, that fell back on him.  This is not a Workman's  Comp injury at this time, but he plans on filing.  He has had decline in  functional indices and quality of life indices.  He has seen Dr. Newell Coral,  who does not believe that he is a surgical candidate at this time.  MRI  reveals posterior protrusion at 5-1.  He is trialed on multiple conservative  management techniques, and has broken through.  He was trialed at medial  branch injection which afforded two days of relief.  He is describing  symptoms consistent with 5-1 and S1 distribution.  He relates his pain is an  8/10 on a subjective scale, dull, achy, and throbbing, with no temporal  relation to the day.  He is improved with rest and some medications, but  frequently breaks through.   MEDICATIONS:  Paxil, Ultram, Xanax, Flexeril.   ALLERGIES:  No known drug allergies.   He states he does not wish to harm himself or others.   REVIEW OF SYSTEMS:  14-point review of systems and health and history form  reviewed.   PAST MEDICAL HISTORY:  Remarkable for hypertension.  He is not followed  regularly by primary care. He is currently not working.   SOCIAL HISTORY:  He is a smoker, I cautioned.  He is married.  He has small  children.   FAMILY HISTORY:  Remarkable for hypertension and heart disease.   REVIEW OF SYSTEMS:   PAST SURGICAL HISTORY:  Otherwise noncontributory to pain  problem.   PHYSICAL EXAMINATION:  GENERAL:  He is a pleasant male sitting comfortably  in bed.  Gait, affect, and appearance is normal.  Oriented x3.  HEENT:  Unremarkable.  CHEST:  Clear to auscultation and percussion.  HEART:  Regular rate and rhythm without murmurs, rubs, or gallops.  ABDOMEN:  Soft and nontender, benign.  No hepatosplenomegaly.  Diffuse  paralumbar myofascial discomfort.  Pain over PSIS.  Notable pain on  extension, with Gaenslen's and Patrick's equivocal.  NEUROLOGY:  No overt neurologic deficit or motor sensory reflex.   IMPRESSION:  Degenerative joint disease of the lumbar spine.    PLAN:  1. He has demonstrated a positive provocative block to  facet medial branch     injection, consider radiofrequency neuroablation.  He did not notice     complete resolution of pain and describes an S1 and L5 nerve root     irritability.  2. I think it is reasonable to go ahead and inject the fifth nerve, and the     S1 nerve root as diagnostic and therapeutic.  3. Appropriate discharge instructions include risk of fall and LSF within     the context of activities of daily living.  Further course of care will     be determined by his presentation on return.  4. Lifestyle enhancements were reviewed, cigarette cessation for best     outcome.  Instructed to maintain contact with Erick Colace, M.D.   He is consented for today's procedure.   PROCEDURE:  Transforaminal epidural injection, S1 nerve root, 5 nerve root,  right side, independent needle access points.   DESCRIPTION OF PROCEDURE:  The patient is taken to the fluoroscopy suite and  placed in prone position.  The back was prepped and draped in the usual  fashion.  Using a 22 gauge blunted spinal needle, I advanced to the  transforaminal epidural space of S1, and 5, and confirmed placement in  multiple fluoroscopic positions.  I used Isovue 200 and withdraw needle  technique.  I injected 2 mL of lidocaine 1% and  PF at each level and 20 mg  of Aristocort at each level.   He tolerated his procedure well, no complications from our procedure.  Discharge instructions given.  Appropriate recovery.  See him in follow-up.  Improved at discharge.                                               Celene Kras, MD    HH/MEDQ  D:  09/14/2003  T:  09/14/2003  Job:  [id number]

## 2011-03-30 NOTE — Cardiovascular Report (Signed)
NAME:  George [name], George [name]                            ACCOUNT NO.:  [id number]   MEDICAL RECORD NO.:  [id number]                   PATIENT TYPE:  INP   LOCATION:  2930                                 FACILITY:  [facility]   PHYSICIAN:  Charlies Constable, M.D. [facility]              DATE OF BIRTH:  1960-12-25   DATE OF PROCEDURE:  06/13/2004  DATE OF DISCHARGE:                              CARDIAC CATHETERIZATION   CLINICAL HISTORY:  George [name] is 50 years old and has no prior history of  known heart disease. He has been having chest pain for two to three weeks  and had severe pain yesterday and went to [facility] emergency room and was  transferred here when his markers returned positive. He was seen by Dr. Daleen Squibb  and scheduled for catheterization this morning.   PROCEDURE:  The procedure was performed via right femoral artery with  arterial sheath and 6-French sheath coronary catheters. A femoral arterial  punch was performed and Omnipaque contrast was used. After completion of  diagnostic study, made decision to proceed with intervention on the right  coronary artery.   The patient was enrolled in the Triton trial which randomized either Plavix  or Triton study drug. He was on an Integrilin drip and was given additional  heparin upon ACT greater than 200 seconds. We used a JR4 6-French Guidant  catheter with side holes and an [employer] soft wire. We crossed the lesion in  the distal right coronary artery without difficulty. We predilated with a  2.5 x 12 mm Quantum Maverick performing one inflation to 10 atmospheres for  30 seconds. We then deployed a 2.5 x 60 mm Taxus stent, positioning the  distal edge just before the bifurcation into the posterior descending and  distal AV right coronary artery. We deployed this with one inflation of 10  atmospheres for 30 seconds. We only went to 10 atmospheres because we felt  the stent was borderline over size for the distal reference lumen. We then  post dilated with  a 2.5 x 12 mm Quantum Maverick performing one inflation up  to 18 atmospheres for 30 seconds avoiding the distal edge. We then performed  a second post dilatation with a 3.0 x 8 mm Quantum Maverick, performing two  inflations up to 14 atmospheres for 30 seconds. Repeat diagnostics were then  performed through the Guidant catheter. The patient tolerated the procedure  well and left the laboratory in satisfactory condition.   RESULTS:  The aortic pressure was 122/78 with a mean of 96. Left ventricular  pressure was 122/16.   The left main coronary artery, __________ free of significant disease.   The left anterior descending artery gave rise to two diagonal branches and a  large septal perforator. There was 50% proximal stenosis and 70% stenosis in  a distal vessel.   The circumflex artery gave rise to a small marginal branch, an  atrial  branch, second marginal branch, and two posterolateral branches. There was  90 and 40% stenoses in the two small posterolateral branches.   The right coronary artery is a moderately large vessel that gave rise to a  conus branch, two right ventricular branches, a posterior descending, and  two posterolateral branches. There was 40% proximal and 40% mid stenosis in  the right coronary artery. There was 95% distal stenosis just before the  posterior descending branch. There was 40% narrowing distal to the posterior  descending branch.   The left ventriculogram performed in the RAO projection showed hyperkinesis  in the inferobasal wall. The overall wall motion was good with an estimated  ejection fraction of 60%.   Following stenting of the lesion in the distal right coronary, stenosis  improved from 95% to 0%.   CONCLUSION:  1. Coronary artery disease status post recent non-ST elevation myocardial     infarction with 50% proximal and 70% distal stenosis in the right     coronary artery, 90% stenosis in a distal posterolateral branch of the      circumflex artery, 40% proximal, 40% mid, and 95% distal stenosis in the     right coronary artery with mild inferior wall inferobasal wall     hypokinesis.  2. Successful deployment of a Taxus drug-eluting stent in the distal right     coronary artery with improvement of __________ narrowing from 95% to 0%.   DISPOSITION:  The patient __________ for further observation. We will target  discharge tomorrow. The patient will need to remain on Triton study drug for  at least one year.                                               Charlies Constable, M.D. [facility]    BB/MEDQ  D:  06/13/2004  T:  06/13/2004  Job:  [id number]   cc:   Kirk Ruths, M.D.  [address]  [place]  [place] [address]  Fax: [phone]   [facility]  [place]   Vida Roller, M.D.  Fax: [phone]

## 2011-04-23 ENCOUNTER — Other Ambulatory Visit: Payer: Self-pay | Admitting: Cardiovascular Disease

## 2011-11-12 ENCOUNTER — Encounter: Payer: Self-pay | Admitting: Cardiovascular Disease

## 2012-12-29 ENCOUNTER — Encounter (HOSPITAL_COMMUNITY): Payer: Self-pay | Admitting: *Deleted

## 2012-12-29 ENCOUNTER — Emergency Department (HOSPITAL_COMMUNITY): Payer: Medicaid Other

## 2012-12-29 ENCOUNTER — Inpatient Hospital Stay (HOSPITAL_COMMUNITY)
Admission: EM | Admit: 2012-12-29 | Discharge: 2013-01-05 | DRG: 234 | Disposition: A | Discharge status: Home / Self Care | Payer: Medicaid Other | Attending: Thoracic Surgery (Cardiothoracic Vascular Surgery) | Admitting: Thoracic Surgery (Cardiothoracic Vascular Surgery)

## 2012-12-29 DIAGNOSIS — Z79899 Other long term (current) drug therapy: Secondary | ICD-10-CM

## 2012-12-29 DIAGNOSIS — I10 Essential (primary) hypertension: Secondary | ICD-10-CM | POA: Diagnosis present

## 2012-12-29 DIAGNOSIS — Z87891 Personal history of nicotine dependence: Secondary | ICD-10-CM

## 2012-12-29 DIAGNOSIS — E785 Hyperlipidemia, unspecified: Secondary | ICD-10-CM

## 2012-12-29 DIAGNOSIS — Z91199 Patient's noncompliance with other medical treatment and regimen due to unspecified reason: Secondary | ICD-10-CM

## 2012-12-29 DIAGNOSIS — I2510 Atherosclerotic heart disease of native coronary artery without angina pectoris: Principal | ICD-10-CM | POA: Diagnosis present

## 2012-12-29 DIAGNOSIS — Z7982 Long term (current) use of aspirin: Secondary | ICD-10-CM

## 2012-12-29 DIAGNOSIS — Z9861 Coronary angioplasty status: Secondary | ICD-10-CM

## 2012-12-29 DIAGNOSIS — D696 Thrombocytopenia, unspecified: Secondary | ICD-10-CM | POA: Diagnosis not present

## 2012-12-29 DIAGNOSIS — K5900 Constipation, unspecified: Secondary | ICD-10-CM | POA: Diagnosis not present

## 2012-12-29 DIAGNOSIS — R079 Chest pain, unspecified: Secondary | ICD-10-CM

## 2012-12-29 DIAGNOSIS — D62 Acute posthemorrhagic anemia: Secondary | ICD-10-CM | POA: Diagnosis not present

## 2012-12-29 DIAGNOSIS — Z8249 Family history of ischemic heart disease and other diseases of the circulatory system: Secondary | ICD-10-CM

## 2012-12-29 DIAGNOSIS — I480 Paroxysmal atrial fibrillation: Secondary | ICD-10-CM

## 2012-12-29 DIAGNOSIS — I4891 Unspecified atrial fibrillation: Secondary | ICD-10-CM | POA: Diagnosis not present

## 2012-12-29 DIAGNOSIS — E119 Type 2 diabetes mellitus without complications: Secondary | ICD-10-CM

## 2012-12-29 DIAGNOSIS — R635 Abnormal weight gain: Secondary | ICD-10-CM | POA: Diagnosis present

## 2012-12-29 DIAGNOSIS — Z9119 Patient's noncompliance with other medical treatment and regimen: Secondary | ICD-10-CM

## 2012-12-29 DIAGNOSIS — I219 Acute myocardial infarction, unspecified: Secondary | ICD-10-CM | POA: Diagnosis present

## 2012-12-29 DIAGNOSIS — E8779 Other fluid overload: Secondary | ICD-10-CM | POA: Diagnosis not present

## 2012-12-29 DIAGNOSIS — I200 Unstable angina: Secondary | ICD-10-CM | POA: Diagnosis present

## 2012-12-29 DIAGNOSIS — I252 Old myocardial infarction: Secondary | ICD-10-CM

## 2012-12-29 DIAGNOSIS — Z951 Presence of aortocoronary bypass graft: Secondary | ICD-10-CM

## 2012-12-29 DIAGNOSIS — F1210 Cannabis abuse, uncomplicated: Secondary | ICD-10-CM | POA: Diagnosis present

## 2012-12-29 LAB — COMPREHENSIVE METABOLIC PANEL
Alkaline Phosphatase: 101 U/L (ref 39–117)
BUN: 12 mg/dL (ref 6–23)
CO2: 22 mEq/L (ref 19–32)
Calcium: 9.4 mg/dL (ref 8.4–10.5)
Chloride: 97 mEq/L (ref 96–112)
Glucose, Bld: 498 mg/dL — ABNORMAL HIGH (ref 70–99)
Potassium: 4.4 mEq/L (ref 3.5–5.1)
Total Protein: 7.6 g/dL (ref 6.0–8.3)

## 2012-12-29 LAB — CBC WITH DIFFERENTIAL/PLATELET
Eosinophils Relative: 4 % (ref 0–5)
Hemoglobin: 16.2 g/dL (ref 13.0–17.0)
Lymphocytes Relative: 42 % (ref 12–46)
MCHC: 35.5 g/dL (ref 30.0–36.0)
Monocytes Absolute: 0.5 10*3/uL (ref 0.1–1.0)
Monocytes Relative: 6 % (ref 3–12)
Neutro Abs: 4.1 10*3/uL (ref 1.7–7.7)
RBC: 5.08 MIL/uL (ref 4.22–5.81)

## 2012-12-29 LAB — GLUCOSE, CAPILLARY: Glucose-Capillary: 288 mg/dL — ABNORMAL HIGH (ref 70–99)

## 2012-12-29 LAB — PROTIME-INR
INR: 1.14 (ref 0.00–1.49)
Prothrombin Time: 14.4 seconds (ref 11.6–15.2)

## 2012-12-29 LAB — TROPONIN I: Troponin I: <0.3 ng/mL (ref <0.30)

## 2012-12-29 MED ORDER — ONDANSETRON HCL 4 MG/2ML IJ SOLN: 4.0000 mg | Freq: Four times a day (QID) | INTRAMUSCULAR | Status: DC | PRN

## 2012-12-29 MED ORDER — INSULIN ASPART 100 UNIT/ML ~~LOC~~ SOLN
0.0000 [IU] | Freq: Three times a day (TID) | SUBCUTANEOUS | Status: DC
  Administered 2012-12-30: 5 [IU] via SUBCUTANEOUS
  Administered 2012-12-30: 11 [IU] via SUBCUTANEOUS

## 2012-12-29 MED ORDER — ENOXAPARIN SODIUM 40 MG/0.4ML ~~LOC~~ SOLN
40.0000 mg | SUBCUTANEOUS | Status: DC
  Filled 2012-12-29: qty 0.4

## 2012-12-29 MED ORDER — ASPIRIN 325 MG PO TABS
325.0000 mg | ORAL_TABLET | Freq: Every day | ORAL | Status: DC
  Filled 2012-12-29: qty 1

## 2012-12-29 MED ORDER — HEPARIN BOLUS VIA INFUSION
4000.0000 [IU] | Freq: Once | INTRAVENOUS | Status: AC
  Administered 2012-12-29: 4000 [IU] via INTRAVENOUS
  Filled 2012-12-29: qty 4000

## 2012-12-29 MED ORDER — MORPHINE SULFATE 4 MG/ML IJ SOLN
INTRAMUSCULAR | Status: AC
  Administered 2012-12-29: 4 mg via INTRAVENOUS
  Filled 2012-12-29: qty 1

## 2012-12-29 MED ORDER — INSULIN ASPART 100 UNIT/ML ~~LOC~~ SOLN
0.0000 [IU] | Freq: Every day | SUBCUTANEOUS | Status: DC
  Administered 2012-12-29: 4 [IU] via SUBCUTANEOUS
  Administered 2012-12-30: 2 [IU] via SUBCUTANEOUS

## 2012-12-29 MED ORDER — MORPHINE SULFATE 4 MG/ML IJ SOLN
4.0000 mg | Freq: Once | INTRAMUSCULAR | Status: AC
  Administered 2012-12-29: 4 mg via INTRAVENOUS
  Filled 2012-12-29: qty 1

## 2012-12-29 MED ORDER — SODIUM CHLORIDE 0.9 % IJ SOLN: 3.0000 mL | INTRAMUSCULAR | Status: DC | PRN

## 2012-12-29 MED ORDER — NITROGLYCERIN IN D5W 200-5 MCG/ML-% IV SOLN
2.0000 ug/min | INTRAVENOUS | Status: DC
  Administered 2012-12-29: 5 ug/min via INTRAVENOUS
  Filled 2012-12-29: qty 250

## 2012-12-29 MED ORDER — LISINOPRIL 20 MG PO TABS
20.0000 mg | ORAL_TABLET | Freq: Every day | ORAL | Status: DC
  Administered 2012-12-30: 20 mg via ORAL
  Filled 2012-12-29 (×2): qty 1

## 2012-12-29 MED ORDER — SODIUM CHLORIDE 0.9 % IJ SOLN
3.0000 mL | Freq: Two times a day (BID) | INTRAMUSCULAR | Status: DC
  Administered 2012-12-30: 3 mL via INTRAVENOUS

## 2012-12-29 MED ORDER — INSULIN ASPART 100 UNIT/ML ~~LOC~~ SOLN
4.0000 [IU] | Freq: Three times a day (TID) | SUBCUTANEOUS | Status: DC
  Administered 2012-12-30 (×2): 4 [IU] via SUBCUTANEOUS

## 2012-12-29 MED ORDER — DIAZEPAM 5 MG PO TABS
10.0000 mg | ORAL_TABLET | Freq: Four times a day (QID) | ORAL | Status: DC | PRN
  Administered 2012-12-31: 10 mg via ORAL
  Filled 2012-12-29: qty 2

## 2012-12-29 MED ORDER — ACETAMINOPHEN 325 MG PO TABS: 650.0000 mg | ORAL_TABLET | ORAL | Status: DC | PRN

## 2012-12-29 MED ORDER — OXYCODONE-ACETAMINOPHEN 5-325 MG PO TABS
1.0000 | ORAL_TABLET | ORAL | Status: DC | PRN
  Administered 2012-12-29: 1 via ORAL
  Filled 2012-12-29: qty 1

## 2012-12-29 MED ORDER — MORPHINE SULFATE 4 MG/ML IJ SOLN
4.0000 mg | Freq: Once | INTRAMUSCULAR | Status: AC
  Administered 2012-12-29: 4 mg via INTRAVENOUS

## 2012-12-29 MED ORDER — OMEGA-3-ACID ETHYL ESTERS 1 G PO CAPS
1.0000 g | ORAL_CAPSULE | Freq: Two times a day (BID) | ORAL | Status: DC
  Administered 2012-12-29 – 2013-01-04 (×11): 1 g via ORAL
  Filled 2012-12-29 (×15): qty 1

## 2012-12-29 MED ORDER — ASPIRIN 325 MG PO TABS: 325.0000 mg | ORAL_TABLET | Freq: Every day | ORAL | Status: DC

## 2012-12-29 MED ORDER — SODIUM CHLORIDE 0.9 % IV SOLN
250.0000 mL | INTRAVENOUS | Status: DC | PRN
  Administered 2012-12-31: 08:00:00 via INTRAVENOUS

## 2012-12-29 MED ORDER — INSULIN ASPART 100 UNIT/ML ~~LOC~~ SOLN
15.0000 [IU] | Freq: Once | SUBCUTANEOUS | Status: AC
  Administered 2012-12-29: 15 [IU] via SUBCUTANEOUS
  Filled 2012-12-29: qty 1

## 2012-12-29 MED ORDER — NITROGLYCERIN 0.4 MG SL SUBL: 0.4000 mg | SUBLINGUAL_TABLET | SUBLINGUAL | Status: DC | PRN

## 2012-12-29 MED ORDER — OXYCODONE-ACETAMINOPHEN 10-325 MG PO TABS: 1.0000 | ORAL_TABLET | ORAL | Status: DC | PRN

## 2012-12-29 MED ORDER — MORPHINE SULFATE 4 MG/ML IJ SOLN
2.0000 mg | INTRAMUSCULAR | Status: DC | PRN
  Administered 2012-12-30: 2 mg via INTRAVENOUS

## 2012-12-29 MED ORDER — OXYCODONE HCL 5 MG PO TABS: 5.0000 mg | ORAL_TABLET | ORAL | Status: DC | PRN

## 2012-12-29 MED ORDER — PANTOPRAZOLE SODIUM 40 MG PO TBEC
40.0000 mg | DELAYED_RELEASE_TABLET | Freq: Every day | ORAL | Status: DC
  Administered 2012-12-30: 40 mg via ORAL
  Filled 2012-12-29: qty 1

## 2012-12-29 MED ORDER — HEPARIN (PORCINE) IN NACL 100-0.45 UNIT/ML-% IJ SOLN
1600.0000 [IU]/h | INTRAMUSCULAR | Status: DC
  Administered 2012-12-29: 1400 [IU]/h via INTRAVENOUS
  Administered 2012-12-30: 1600 [IU]/h via INTRAVENOUS
  Filled 2012-12-29 (×2): qty 250

## 2012-12-29 MED ORDER — NITROGLYCERIN 2 % TD OINT
1.0000 [in_us] | TOPICAL_OINTMENT | Freq: Once | TRANSDERMAL | Status: AC
  Administered 2012-12-29: 1 [in_us] via TOPICAL
  Filled 2012-12-29: qty 1

## 2012-12-29 MED ORDER — ASPIRIN 81 MG PO CHEW
324.0000 mg | CHEWABLE_TABLET | ORAL | Status: AC
  Administered 2012-12-30: 324 mg via ORAL
  Filled 2012-12-29: qty 4

## 2012-12-29 MED ORDER — METOPROLOL TARTRATE 25 MG PO TABS
25.0000 mg | ORAL_TABLET | Freq: Two times a day (BID) | ORAL | Status: DC
  Administered 2012-12-29 – 2012-12-30 (×3): 25 mg via ORAL
  Filled 2012-12-29 (×5): qty 1

## 2012-12-29 MED ORDER — SIMVASTATIN 40 MG PO TABS
40.0000 mg | ORAL_TABLET | Freq: Every day | ORAL | Status: DC
  Administered 2012-12-29 – 2013-01-02 (×4): 40 mg via ORAL
  Filled 2012-12-29 (×6): qty 1

## 2012-12-29 NOTE — ED Notes (Signed)
Report called to Tammy, Rn on unit 3W.

## 2012-12-29 NOTE — ED Notes (Signed)
Dr. Estell Harpin notified of completion of CMET

## 2012-12-29 NOTE — ED Notes (Signed)
Lab called and advised that pt's [facility]met would have [facility] to [facility] "run" due to high lipid content. Dr. Estell Harpin notified,

## 2012-12-29 NOTE — ED Notes (Signed)
Pt reports taking nitro SL x 4 PTA with some relief.

## 2012-12-29 NOTE — Progress Notes (Signed)
ANTICOAGULATION CONSULT NOTE - Initial Consult  Pharmacy Consult for Heparin Indication: chest pain/ACS  No Known Allergies  Patient Measurements: Height: 5\' 10"  (177.8 cm) Weight: 250 lb (113.399 kg) IBW/kg (Calculated) : 73 Heparin Dosing Weight: 97.9 kg  Vital Signs: Temp: 98.5 F (36.9 C) (02/17 1651) Temp src: Oral (02/17 1651) BP: 136/75 mmHg (02/17 1651) Pulse Rate: 71 (02/17 1458)  Labs:  Recent Labs  12/29/12 1030 12/29/12 1445  HGB 16.2  --   HCT 45.6  --   PLT 198  --   CREATININE 0.89  --   TROPONINI <0.30 <0.30    Estimated Creatinine Clearance: 125.3 ml/min (by C-G formula based on Cr of 0.89).   Medical History: Past Medical History  Diagnosis Date  . Hyperlipidemia   . Coronary artery disease   . Hypertriglyceridemia   . Dyslipidemia   . Tobacco abuse   . AMI (acute myocardial infarction) 2005    Medications:  Prescriptions prior to admission  Medication Sig Dispense Refill  . aspirin 325 MG tablet Take 325 mg by mouth daily.        . diazepam (VALIUM) 10 MG tablet Take 10 mg by mouth every 6 (six) hours as needed for anxiety.      [name] lisinopril (PRINIVIL,ZESTRIL) 20 MG tablet Take 20 mg by mouth daily.      . metoprolol tartrate (LOPRESSOR) 25 MG tablet Take 25 mg by mouth 2 (two) times daily.      . nitroGLYCERIN (NITROSTAT) 0.4 MG SL tablet Place 0.4 mg under the tongue daily.        . Omega-3 Fatty Acids (FISH OIL) 500 MG CAPS Take 1 capsule by mouth 3 (three) times daily.        [name] omeprazole (PRILOSEC) 40 MG capsule Take 40 mg by mouth daily.        [name] oxyCODONE-acetaminophen (PERCOCET) 10-325 MG per tablet Take 1 tablet by mouth every 4 (four) hours as needed for pain.      . simvastatin (ZOCOR) 40 MG tablet Take 40 mg by mouth at bedtime.          Assessment: 52 y/o male who [facility] to [facility] with chest pain associated with SOB, lightheadedness and diaphoresis with some relief from nitroglycerin. Pharmacy consulted to begin  heparin drip. Heparin was not started at [facility] and Lovenox that was ordered is not charted as given. CBC is normal at baseline, trop neg x2.  Goal of Therapy:  Heparin level 0.3-0.7 units/ml Monitor platelets by anticoagulation protocol: Yes   Plan:  -Heparin 4000 units IV bolus then infuse at 1400 units/hr -Heparin level 6 hours after started -Daily heparin level and CBC while on heparin -Monitor for signs/symptoms of bleeding  [facility], [place].D., BCPS Clinical Pharmacist Pager: [phone] 12/29/2012 5:35 PM

## 2012-12-29 NOTE — Progress Notes (Signed)
Pt arrived from [facility] c/o 3/10 chest pain, starting in the middle and radiating to his L neck; Solon Palm paged and made aware; VVS, EKG obtained, pt on 2L-[place], 1 SL NTG given with some relief, pt refusing anymore NTG, will continue to monitor

## 2012-12-29 NOTE — H&P (Signed)
History and Physical   Patient ID: George [name] MRN: [id number], DOB/AGE: August 09, 1961 52 y.o. Date of Encounter: 12/29/2012  Primary Physician: Kirk Ruths, MD Primary Cardiologist: PN  Chief Complaint: Chest pain  HPI:  Mr. Mentzer is a 52 year old male with a history of coronary artery disease. He is followed by Dr. Eden Emms, last seen in 2011. No recent stress testing or other cardiac evaluation. He had chest pain and went to the [facility] emergency room. He was transferred to [facility], and admitted for further evaluation and treatment. He has not taken good care of himself.  Gained about 50 lbs.  Recently prescribed DM meds by Dr Regino Schultze but has not taken them because they hurt his stomach. SSCP like his previous angina.  Left sided and worse with exertion the last few weeks.  No dyspnea Some polyuria.      Past Medical History  Diagnosis Date  . Hyperlipidemia   . Coronary artery disease   . Hypertriglyceridemia   . Dyslipidemia   . Tobacco abuse   . AMI (acute myocardial infarction) 2005     Surgical History:  Past Surgical History  Procedure Laterality Date  . Lumbar spine surgery  2005  . Coronary stent placement  2005    LAD50/70, CFX OK, PL1 90, PL2 40, RCA 95->0 with 2.5 x 60 mm Taxus stent, EF 60  . Angioplasty    . Coronary angioplasty       I have reviewed the patient's current medications. Prior to Admission medications   Medication Sig Start Date End Date Taking? Authorizing Provider  aspirin 325 MG tablet Take 325 mg by mouth daily.     Yes Historical Provider, MD  diazepam (VALIUM) 10 MG tablet Take 10 mg by mouth every 6 (six) hours as needed for anxiety.   Yes Historical Provider, MD  lisinopril (PRINIVIL,ZESTRIL) 20 MG tablet Take 20 mg by mouth daily.   Yes Historical Provider, MD  metoprolol tartrate (LOPRESSOR) 25 MG tablet TAKE ONE TABLET BY MOUTH TWICE DAILY 04/23/11  Yes Wendall Stade, MD  nitroGLYCERIN (NITROSTAT) 0.4 MG SL tablet Place 0.4 mg  under the tongue daily.     Yes Historical Provider, MD  Omega-3 Fatty Acids (FISH OIL) 500 MG CAPS Take 1 capsule by mouth 3 (three) times daily.     Yes Historical Provider, MD  omeprazole (PRILOSEC) 40 MG capsule Take 40 mg by mouth daily.     Yes Historical Provider, MD  oxyCODONE-acetaminophen (PERCOCET) 10-325 MG per tablet Take 1 tablet by mouth every 4 (four) hours as needed for pain.   Yes Historical Provider, MD  simvastatin (ZOCOR) 40 MG tablet Take 40 mg by mouth at bedtime.     Yes Historical Provider, MD    Scheduled Meds: Continuous Infusions: PRN Meds:.  Allergies: No Known Allergies  History   Social History  . Marital Status: Legally Separated    Spouse Name: N/A    Number of Children: N/A  . Years of Education: N/A   Occupational History  . Disabled    Social History Main Topics  . Smoking status: Never Smoker   . Smokeless tobacco: Not on file  . Alcohol Use: No  . Drug Use: Yes    Special: Marijuana  . Sexually Active: Not on file   Other Topics Concern  . Not on file   Social History Narrative   Married   Sedentary    Review of Systems:   Full 14-point review of systems  otherwise negative except as noted above.  Physical Exam: Blood pressure 130/76, pulse 74, temperature 97.8 F (36.6 C), temperature source Oral, resp. rate 18, height 5\' 10"  (1.778 m), weight 250 lb (113.399 kg), SpO2 96.00%. General: Well developed, well nourished, male in no acute distress. Head: Normocephalic, atraumatic, sclera non-icteric, no xanthomas, nares are without discharge. Dentition:  Neck: carotid bruits. JVD not elevated. No thyromegally Lungs: Good expansion bilaterally. without wheezes or rhonchi.  Heart: IRRegular rate and rhythm with S1 S2.  No S3 or S4.  No murmur, no rubs, or gallops appreciated. Abdomen: Soft, non-tender, non-distended with normoactive bowel sounds. No hepatomegaly. No rebound/guarding. No obvious abdominal masses. Msk:  Strength and tone  appear normal for age. No joint deformities or effusions, no spine or costo-vertebral angle tenderness. Extremities: No clubbing or cyanosis. No edema.  Distal pedal pulses are 2+ in 4 extrem Neuro: Alert and oriented X 3. Moves all extremities spontaneously. No focal deficits noted. Psych:  Responds to questions appropriately with a normal affect. Skin: No rashes or lesions noted  Labs:   Lab Results  Component Value Date   WBC 8.6 12/29/2012   HGB 16.2 12/29/2012   HCT 45.6 12/29/2012   MCV 89.8 12/29/2012   PLT 198 12/29/2012   No results found for this basename: INR,  in the last 72 hours   Recent Labs Lab 12/29/12 1030  NA 135  K 4.4  CL 97  CO2 22  BUN 12  CREATININE 0.89  CALCIUM 9.4  PROT 7.6  BILITOT 0.2*  ALKPHOS 101  ALT 67*  AST 32  GLUCOSE 498*    Recent Labs  12/29/12 1030  TROPONINI <0.30   Radiology/Studies:  Dg Chest Portable 1 View 12/29/2012  *RADIOLOGY REPORT*  Clinical Data: Chest pain  PORTABLE CHEST - 1 VIEW  Comparison: 06/12/2004  Findings: Artifact overlies chest.  Heart size is normal. Mediastinal shadows are normal.  The vascularity is normal.  Lungs are clear.  No effusions.  No significant bony finding.  IMPRESSION: No active disease   Original Report Authenticated By: Paulina Fusi, M.D.      Cardiac Cath: 06/13/2004 The left anterior descending artery gave rise to two diagonal branches and a large septal perforator. There was 50% proximal stenosis and 70% stenosis in a distal vessel.  The circumflex artery gave rise to a small marginal branch, an atrial branch, second marginal branch, and two posterolateral branches. There was 90 and 40% stenoses in the two small posterolateral branches.  The right coronary artery is a moderately large vessel that gave rise to a  conus branch, two right ventricular branches, a posterior descending, and two posterolateral branches. There was 40% proximal and 40% mid stenosis in the right coronary artery. There  was 95% distal stenosis just before the posterior descending branch. There was 40% narrowing distal to the posterior descending branch.  The left ventriculogram performed in the RAO projection showed hyperkinesis in the inferobasal wall. The overall wall motion was good with an estimated  ejection fraction of 60%.  Following stenting of the lesion in the distal right coronary, stenosis improved from 95% to 0%.  CONCLUSION:  1. Coronary artery disease status post recent non-ST elevation myocardial infarction with 50% proximal and 70% distal stenosis in the right coronary artery, 90% stenosis in a distal posterolateral branch of the circumflex artery, 40% proximal, 40% mid, and 95% distal stenosis in the  right coronary artery with mild inferior wall inferobasal wall  hypokinesis.  2.  Successful deployment of a Taxus drug-eluting stent in the distal right coronary artery with improvement of  narrowing from 95% to 0%.  Echo: None  ECG: 29-Dec-2012 10:29:22  Normal sinus rhythm Septal infarct , age undetermined Abnormal ECG When compared with ECG of 29-Dec-2012 10:06, Septal infarct is now Present ST no longer depressed in Lateral leads T wave inversion no longer evident in Anterolateral leads Vent. rate 69 BPM PR interval 166 ms QRS duration 104 ms QT/QTc 408/437 ms P-R-T axes 54 71 53   ASSESSMENT AND PLAN:  Chest Pain:  Known CAD with poorly controlled DM.  Discussed options with patient  Favor cath to define anatomy given known disease.  Heparin only if recurrent pain.  Enzymes negative and ECG with no acute changes. DM:  Discussed issues with lack of compliance Will call Dr Edison Simon office in [place] to see what he was supposed to be on.  IM consult for management in hospital and SS insulin.  No glucophage given need for cath and I suspect this is what upset his stomach. Chol:  Check fasting lipids   Signed, Charlton Haws 5:00 PM

## 2012-12-29 NOTE — ED Provider Notes (Signed)
History     This chart was scribed for George Lennert, MD, MD by Smitty Pluck, ED Scribe. The patient was seen in room [facility]18/[facility] and the patient's care was started at 10:24 AM.   CSN: [id number]  Arrival date & time 12/29/12  1005      Chief Complaint  Patient presents with  . Chest Pain   Patient is a 52 y.o. male presenting with chest pain. The history is provided by the patient. No language interpreter was used.  Chest Pain Pain location:  Substernal area Pain quality: sharp   Pain radiates to:  L arm and R arm Pain radiates to the back: no   Pain severity:  Moderate Onset quality:  Sudden Timing:  Intermittent Progression:  Worsening Chronicity:  Recurrent Relieved by:  Nitroglycerin Worsened by:  Exertion Associated symptoms: diaphoresis and shortness of breath   Associated symptoms: no abdominal pain, no back pain, no cough, no fatigue and no headache    George [name] is a 52 y.o. male with hx of CAD, AMI and hypertriglyceridemia who presents to the Emergency Department complaining of intermittent, sharp, substernal chest pain radiating to bilateral arms that has been ongoing for the past year. Pt reports that he had pain today 4 hours ago and it awoke him out of his sleep. He states the current episode of chest pain has been constant. He mentions he had diaphoresis and SOB. Pt reports that the pain has worsened within the past week. He states the pain is aggravated by exertion. Pt has hx of catheretization by Dr. Dickie La (Aug 2005) and stress test in 2012. Pt took 3 nitroglycerin today within the past 2 hours with minor relief.    Cardiologist Dr. Dessa Phi     Past Medical History  Diagnosis Date  . Hyperlipidemia   . Coronary artery disease   . Hypertriglyceridemia   . Dyslipidemia   . Tobacco abuse   . AMI (acute myocardial infarction)     Past Surgical History  Procedure Laterality Date  . Lumbar spine surgery    . Coronary stent placement      RCA  .  Angioplasty    . Coronary angioplasty      No family history on file.  History  Substance Use Topics  . Smoking status: Never Smoker   . Smokeless tobacco: Not on file  . Alcohol Use: No      Review of Systems  Constitutional: Positive for diaphoresis. Negative for fatigue.  HENT: Negative for congestion, sinus pressure and ear discharge.   Eyes: Negative for discharge.  Respiratory: Positive for shortness of breath. Negative for cough.   Cardiovascular: Positive for chest pain.  Gastrointestinal: Negative for abdominal pain and diarrhea.  Genitourinary: Negative for frequency and hematuria.  Musculoskeletal: Negative for back pain.  Skin: Negative for rash.  Neurological: Negative for seizures and headaches.  Psychiatric/Behavioral: Negative for hallucinations.  All other systems reviewed and are negative.    Allergies  Review of patient's allergies indicates no known allergies.  Home Medications   Current Outpatient Rx  Name  Route  Sig  Dispense  Refill  . aspirin 325 MG tablet   Oral   Take 325 mg by mouth daily.           . metoprolol tartrate (LOPRESSOR) 25 MG tablet      TAKE ONE TABLET BY MOUTH TWICE DAILY   30 tablet   12   . nitroGLYCERIN (NITROSTAT) 0.4 MG SL tablet  Sublingual   Place 0.4 mg under the tongue daily.           . Omega-3 Fatty Acids (FISH OIL) 500 MG CAPS   Oral   Take 1 capsule by mouth 3 (three) times daily.           [name] omeprazole (PRILOSEC) 40 MG capsule   Oral   Take 40 mg by mouth daily.           . simvastatin (ZOCOR) 40 MG tablet   Oral   Take 40 mg by mouth at bedtime.             BP 172/98  Temp(Src) 97.8 F (36.6 C) (Oral)  Resp 20  Ht 5\' 10"  (1.778 m)  Wt 250 lb (113.399 kg)  BMI 35.87 kg/m2  SpO2 95%  Physical Exam  Nursing note and vitals reviewed. Constitutional: He is oriented to person, place, and time. He appears well-developed.  HENT:  Head: Normocephalic and atraumatic.  Eyes:  Conjunctivae and EOM are normal. No scleral icterus.  Neck: Neck supple. No thyromegaly present.  Cardiovascular: Normal rate and regular rhythm.  Exam reveals no gallop and no friction rub.   No murmur heard. Pulmonary/Chest: No stridor. He has no wheezes. He has no rales. He exhibits no tenderness.  Abdominal: He exhibits no distension. There is no tenderness. There is no rebound.  Musculoskeletal: Normal range of motion. He exhibits no edema.  Lymphadenopathy:    He has no cervical adenopathy.  Neurological: He is oriented to person, place, and time. Coordination normal.  Skin: No rash noted. No erythema.  Psychiatric: He has a normal mood and affect. His behavior is normal.    ED Course  Procedures (including critical care time) DIAGNOSTIC STUDIES: Oxygen Saturation is 95% on room air, adequate by my interpretation.    COORDINATION OF CARE: 10:28 AM Discussed ED treatment with pt and pt agrees.  10:31 AM Ordered:  Medications  morphine 4 MG/ML injection 4 mg (not administered)  nitroGLYCERIN (NITROGLYN) 2 % ointment 1 inch (not administered)       Labs Reviewed  COMPREHENSIVE METABOLIC PANEL - Abnormal; Notable for the following:    Glucose, Bld 498 (*)    ALT 67 (*)    Total Bilirubin 0.2 (*)    All other components within normal limits  CBC WITH DIFFERENTIAL  TROPONIN I   Dg Chest Portable 1 View  12/29/2012  *RADIOLOGY REPORT*  Clinical Data: Chest pain  PORTABLE CHEST - 1 VIEW  Comparison: 06/12/2004  Findings: Artifact overlies chest.  Heart size is normal. Mediastinal shadows are normal.  The vascularity is normal.  Lungs are clear.  No effusions.  No significant bony finding.  IMPRESSION: No active disease   Original Report Authenticated By: Paulina Fusi, M.D.      No diagnosis found.  CRITICAL CARE Performed by: Devynn Hessler L   Total critical care time:40  Critical care time was exclusive of separately billable procedures and treating other  patients.  Critical care was necessary to treat or prevent imminent or life-threatening deterioration.  Critical care was time spent personally by me on the following activities: development of treatment plan with patient and/or surrogate as well as nursing, discussions with consultants, evaluation of patient's response to treatment, examination of patient, obtaining history from patient or surrogate, ordering and performing treatments and interventions, ordering and review of laboratory studies, ordering and review of radiographic studies, pulse oximetry and re-evaluation of patient's condition.  Date: 12/29/2012  Rate:70  Rhythm: normal sinus rhythm  QRS Axis: normal  Intervals: normal  ST/T Wave abnormalities: ST depressions laterally  Conduction Disutrbances:none  Narrative Interpretation:   Old EKG Reviewed: changes noted   Date: 12/29/2012  Rate: 69  Rhythm: normal sinus rhythm  QRS Axis: normal  Intervals: normal  ST/T Wave abnormalities: normal  Conduction Disutrbances:none  Narrative Interpretation:   Old EKG Reviewed: unchanged    MDM   [facility]    The chart was scribed for me under my direct supervision.  I personally performed the history, physical, and medical decision making and all procedures in the evaluation of this patient.George Lennert, MD 12/29/12 [phone]

## 2012-12-29 NOTE — ED Notes (Signed)
Central cp radiating to both arms that started this morning with sob, dizziness/lightheadedness, and diaphoresis.

## 2012-12-30 ENCOUNTER — Inpatient Hospital Stay (HOSPITAL_COMMUNITY): Payer: Medicaid Other

## 2012-12-30 ENCOUNTER — Encounter (HOSPITAL_COMMUNITY): Payer: Self-pay | Admitting: Anesthesiology

## 2012-12-30 ENCOUNTER — Encounter (HOSPITAL_COMMUNITY)
Admission: EM | Disposition: A | Discharge status: Home / Self Care | Payer: Self-pay | Source: Home / Self Care | Attending: Thoracic Surgery (Cardiothoracic Vascular Surgery)

## 2012-12-30 ENCOUNTER — Ambulatory Visit (HOSPITAL_COMMUNITY): Admit: 2012-12-30 | Payer: Self-pay | Admitting: Cardiovascular Disease

## 2012-12-30 ENCOUNTER — Other Ambulatory Visit: Payer: Self-pay | Admitting: *Deleted

## 2012-12-30 DIAGNOSIS — E785 Hyperlipidemia, unspecified: Secondary | ICD-10-CM

## 2012-12-30 DIAGNOSIS — I2510 Atherosclerotic heart disease of native coronary artery without angina pectoris: Secondary | ICD-10-CM

## 2012-12-30 DIAGNOSIS — E119 Type 2 diabetes mellitus without complications: Secondary | ICD-10-CM

## 2012-12-30 DIAGNOSIS — Z01810 Encounter for preprocedural cardiovascular examination: Secondary | ICD-10-CM

## 2012-12-30 HISTORY — PX: LEFT HEART CATHETERIZATION WITH CORONARY ANGIOGRAM: SHX5451

## 2012-12-30 LAB — URINALYSIS, ROUTINE W REFLEX MICROSCOPIC
Hgb urine dipstick: NEGATIVE
Leukocytes, UA: NEGATIVE
Protein, ur: NEGATIVE mg/dL
Specific Gravity, Urine: 1.034 — ABNORMAL HIGH (ref 1.005–1.030)
Urobilinogen, UA: 1 mg/dL (ref 0.0–1.0)

## 2012-12-30 LAB — GLUCOSE, CAPILLARY: Glucose-Capillary: 309 mg/dL — ABNORMAL HIGH (ref 70–99)

## 2012-12-30 LAB — CBC
MCH: 32.2 pg (ref 26.0–34.0)
MCH: 31.4 pg (ref 26.0–34.0)
MCHC: 36.1 g/dL — ABNORMAL HIGH (ref 30.0–36.0)
MCV: 90.1 fL (ref 78.0–100.0)
Platelets: 157 10*3/uL (ref 150–400)
Platelets: 168 10*3/uL (ref 150–400)
RDW: 13 % (ref 11.5–15.5)
RDW: 12.9 % (ref 11.5–15.5)

## 2012-12-30 LAB — HEPARIN LEVEL (UNFRACTIONATED): Heparin Unfractionated: 0.26 IU/mL — ABNORMAL LOW (ref 0.30–0.70)

## 2012-12-30 LAB — LIPID PANEL
HDL: 34 mg/dL — ABNORMAL LOW (ref >39)
Total CHOL/HDL Ratio: 5.5 RATIO
VLDL: UNDETERMINED mg/dL (ref 0–40)

## 2012-12-30 LAB — ABO/RH: ABO/RH(D): A POS

## 2012-12-30 LAB — BASIC METABOLIC PANEL
Calcium: 9.1 mg/dL (ref 8.4–10.5)
Creatinine, Ser: 0.88 mg/dL (ref 0.50–1.35)
GFR calc Af Amer: >90 mL/min (ref >90)
GFR calc non Af Amer: >90 mL/min (ref >90)

## 2012-12-30 LAB — URINE MICROSCOPIC-ADD ON

## 2012-12-30 LAB — HEMOGLOBIN A1C: Hgb A1c MFr Bld: 12 % — ABNORMAL HIGH (ref <5.7)

## 2012-12-30 LAB — TYPE AND SCREEN

## 2012-12-30 LAB — TROPONIN I: Troponin I: <0.3 ng/mL (ref <0.30)

## 2012-12-30 SURGERY — LEFT HEART CATHETERIZATION WITH CORONARY ANGIOGRAM
Anesthesia: LOCAL

## 2012-12-30 MED ORDER — VANCOMYCIN HCL 10 G IV SOLR
1500.0000 mg | INTRAVENOUS | Status: AC
  Administered 2012-12-31: 1500 mg via INTRAVENOUS
  Filled 2012-12-30: qty 1500

## 2012-12-30 MED ORDER — MORPHINE SULFATE 2 MG/ML IJ SOLN
INTRAMUSCULAR | Status: AC
  Filled 2012-12-30: qty 1

## 2012-12-30 MED ORDER — VERAPAMIL HCL 2.5 MG/ML IV SOLN
INTRAVENOUS | Status: AC
  Filled 2012-12-30: qty 2

## 2012-12-30 MED ORDER — BISACODYL 5 MG PO TBEC
5.0000 mg | DELAYED_RELEASE_TABLET | Freq: Once | ORAL | Status: AC
  Administered 2012-12-30: 5 mg via ORAL
  Filled 2012-12-30: qty 1

## 2012-12-30 MED ORDER — DEXMEDETOMIDINE HCL IN NACL 400 MCG/100ML IV SOLN
0.1000 ug/kg/h | INTRAVENOUS | Status: AC
  Administered 2012-12-31: 0.3 ug/kg/h via INTRAVENOUS
  Filled 2012-12-30: qty 100

## 2012-12-30 MED ORDER — ALPRAZOLAM 0.25 MG PO TABS
0.2500 mg | ORAL_TABLET | ORAL | Status: DC | PRN
  Administered 2013-01-01: 0.5 mg via ORAL
  Filled 2012-12-30: qty 2

## 2012-12-30 MED ORDER — DIAZEPAM 5 MG PO TABS: 5.0000 mg | ORAL_TABLET | Freq: Once | ORAL | Status: AC

## 2012-12-30 MED ORDER — HEPARIN SODIUM (PORCINE) 1000 UNIT/ML IJ SOLN
INTRAMUSCULAR | Status: AC
  Filled 2012-12-30: qty 1

## 2012-12-30 MED ORDER — SODIUM CHLORIDE 0.9 % IV SOLN
INTRAVENOUS | Status: DC
  Administered 2012-12-31: via INTRAVENOUS

## 2012-12-30 MED ORDER — SODIUM CHLORIDE 0.9 % IV SOLN
INTRAVENOUS | Status: DC
  Filled 2012-12-30: qty 1

## 2012-12-30 MED ORDER — LIVING WELL WITH DIABETES BOOK
Freq: Once | Status: DC
  Filled 2012-12-30 (×2): qty 1

## 2012-12-30 MED ORDER — TEMAZEPAM 15 MG PO CAPS
15.0000 mg | ORAL_CAPSULE | Freq: Once | ORAL | Status: AC | PRN
  Administered 2012-12-30: 15 mg via ORAL
  Filled 2012-12-30: qty 1

## 2012-12-30 MED ORDER — DEXTROSE 5 % IV SOLN
1.5000 g | INTRAVENOUS | Status: AC
  Administered 2012-12-31: .75 g via INTRAVENOUS
  Administered 2012-12-31: 1.5 g via INTRAVENOUS
  Filled 2012-12-30: qty 1.5

## 2012-12-30 MED ORDER — VERAPAMIL HCL 2.5 MG/ML IV SOLN
INTRAVENOUS | Status: AC
  Administered 2012-12-31: 10:00:00
  Filled 2012-12-30: qty 2.5

## 2012-12-30 MED ORDER — MIDAZOLAM HCL 2 MG/2ML IJ SOLN
INTRAMUSCULAR | Status: AC
  Filled 2012-12-30: qty 2

## 2012-12-30 MED ORDER — MAGNESIUM SULFATE 50 % IJ SOLN
40.0000 meq | INTRAMUSCULAR | Status: DC
  Filled 2012-12-30: qty 10

## 2012-12-30 MED ORDER — EPINEPHRINE HCL 1 MG/ML IJ SOLN
0.5000 ug/min | INTRAVENOUS | Status: DC
  Filled 2012-12-30: qty 4

## 2012-12-30 MED ORDER — LIDOCAINE HCL (PF) 1 % IJ SOLN
INTRAMUSCULAR | Status: AC
  Filled 2012-12-30: qty 30

## 2012-12-30 MED ORDER — METOPROLOL TARTRATE 12.5 MG HALF TABLET
12.5000 mg | ORAL_TABLET | Freq: Once | ORAL | Status: AC
  Administered 2012-12-31: 12.5 mg via ORAL
  Filled 2012-12-30: qty 1

## 2012-12-30 MED ORDER — HEPARIN (PORCINE) IN NACL 2-0.9 UNIT/ML-% IJ SOLN
INTRAMUSCULAR | Status: AC
  Filled 2012-12-30: qty 1000

## 2012-12-30 MED ORDER — CHLORHEXIDINE GLUCONATE 4 % EX LIQD
60.0000 mL | Freq: Once | CUTANEOUS | Status: AC
  Administered 2012-12-31: 4 via TOPICAL
  Filled 2012-12-30: qty 15

## 2012-12-30 MED ORDER — FENTANYL CITRATE 0.05 MG/ML IJ SOLN
INTRAMUSCULAR | Status: AC
  Filled 2012-12-30: qty 2

## 2012-12-30 MED ORDER — POTASSIUM CHLORIDE 2 MEQ/ML IV SOLN
80.0000 meq | INTRAVENOUS | Status: DC
  Filled 2012-12-30: qty 40

## 2012-12-30 MED ORDER — PHENYLEPHRINE HCL 10 MG/ML IJ SOLN
30.0000 ug/min | INTRAVENOUS | Status: AC
  Administered 2012-12-31: 25 ug/min via INTRAVENOUS
  Filled 2012-12-30: qty 2

## 2012-12-30 MED ORDER — NITROGLYCERIN IN D5W 200-5 MCG/ML-% IV SOLN
2.0000 ug/min | INTRAVENOUS | Status: DC
  Filled 2012-12-30 (×3): qty 250

## 2012-12-30 MED ORDER — NITROGLYCERIN 1 MG/10 ML FOR IR/CATH LAB
INTRA_ARTERIAL | Status: AC
  Filled 2012-12-30: qty 10

## 2012-12-30 MED ORDER — SODIUM CHLORIDE 0.9 % IV SOLN
INTRAVENOUS | Status: AC
  Administered 2012-12-31: 69.8 mL/h via INTRAVENOUS
  Filled 2012-12-30: qty 40

## 2012-12-30 MED ORDER — DEXTROSE 5 % IV SOLN
750.0000 mg | INTRAVENOUS | Status: DC
  Filled 2012-12-30: qty 750

## 2012-12-30 MED ORDER — MORPHINE SULFATE 2 MG/ML IJ SOLN
INTRAMUSCULAR | Status: AC
  Administered 2012-12-30: 2 mg via INTRAVENOUS
  Filled 2012-12-30: qty 1

## 2012-12-30 MED ORDER — HEPARIN (PORCINE) IN NACL 100-0.45 UNIT/ML-% IJ SOLN
1850.0000 [IU]/h | INTRAMUSCULAR | Status: DC
  Administered 2012-12-30: 1600 [IU]/h via INTRAVENOUS
  Administered 2012-12-30: 1850 [IU]/h via INTRAVENOUS
  Filled 2012-12-30 (×3): qty 250

## 2012-12-30 MED ORDER — DOPAMINE-DEXTROSE 3.2-5 MG/ML-% IV SOLN
2.0000 ug/kg/min | INTRAVENOUS | Status: DC
  Filled 2012-12-30: qty 250

## 2012-12-30 NOTE — Progress Notes (Signed)
ANTICOAGULATION CONSULT NOTE - Follow Up Consult  Pharmacy Consult for heparin Indication: chest pain/ACS  Labs:  Recent Labs  12/29/12 1030 12/29/12 1445 12/29/12 1735 12/29/12 1810 12/30/12 0031  HGB 16.2  --   --   --  15.0  HCT 45.6  --   --   --  41.6  PLT 198  --   --   --  157  LABPROT  --   --   --  14.4  --   INR  --   --   --  1.14  --   HEPARINUNFRC  --   --   --   --  0.26*  CREATININE 0.89  --   --   --   --   TROPONINI <0.30 <0.30 <0.30  --   --     Assessment: 51yo male subtherapeutic on heparin with initial dosing for CP; troponin remains negative.  Goal of Therapy:  Heparin level 0.3-0.7 units/ml   Plan:  Will increase heparin gtt by 2 units/kg/hr to 1600 units/hr and check level in 6hr.  Colleen Can PharmD BCPS 12/30/2012,1:45 AM

## 2012-12-30 NOTE — Progress Notes (Signed)
Inpatient Diabetes Program Recommendations  AACE/ADA: New Consensus Statement on Inpatient Glycemic Control (2013)  Target Ranges:  Prepandial:   less than 140 mg/dL      Peak postprandial:   less than 180 mg/dL (1-2 hours)      Critically ill patients:  140 - 180 mg/dL    Results for George [name], George [name] (MRN [id number]) as of 12/30/2012 16:29  Ref. Range 12/30/2012 08:36 12/30/2012 11:51  Glucose-Capillary Latest Range: 70-99 mg/dL 147 (H) 829 (H)   Results for [name], [name] (MRN [id number]) as of 12/30/2012 16:29  Ref. Range 12/30/2012 05:00  Hemoglobin A1C Latest Range: <5.7 % 12.0 (H)   Spoke with patient about his A1c of 12%.  Patient told me he sees Dr. Karleen Hampshire in [place] for primary care.  Patient told me that Dr. Regino Schultze diagnosed him with diabetes over a year ago but that he has chosen to ignore the diagnosis until now.  Was given a Rx for Metformin about 1 year ago by his PCP.  Took the Metformin for 4 days and then stopped b/c it gave him diarrhea even though his PCP explained to him that GI upset is common and usually subsides after a few weeks with Metformin.  Patient became very tearful during our conversation and told me that he is willing to take his diabetes seriously now.  Patient told me he wants to live a healthier life so he can be around for his wife and his 51 year old step-son.  Explained to patient what an A1c is and what it measures.  Explained patient's A1c results with him.  Also explained to patient that good CBG control is imperative to prevent further complications.  Explained to patient that he will also need good CBG control to protect his bypasses after CABG surgery.  Will order RD consult for diet education.  RNs to provide further diabetes education at bedside with patient.  Patient can follow-up with RD at [facility] for free after d/c for further diabetes instruction.  Based on his A1c of 12%, it may benefit patient to be d/c'd home on insulin.  Will  continue to follow and assist as needed.  Ambrose Finland RN, MSN, CDE Diabetes Coordinator Inpatient Diabetes Program [phone]

## 2012-12-30 NOTE — Progress Notes (Signed)
PFT done at beside, simple spirometry is all pt could perform at this time, No before and after done, pt had some chest pain prior to test, chest pain became worse, Test terminated. RN aware. Will cont to monitor

## 2012-12-30 NOTE — Consult Note (Signed)
Reason for Consult:3 vessel CAD with unstable angina  Referring Physician: Dr. Nishan  George [name] is an 51 y.o. male.  HPI: 51 yo WM with history of CAD dating back to 2005 when he had an MI. He did have a stent placed at that time. He has not been reliable in terms of follow up. He was diagnosed with diabetes about a year ago but has been non-compliant with medications. He does have a history of HTN and dyslipidemia. He is a former smoker, having quit in 2005 after his MI. He also has a very strong family history. He says he has been having CP for the past year. It has become more frequent and severe over the past month. On the morning of admission he was working on his taxes when he developed severe left sided CP. He went to the [place] ED and was transferred to [facility] for further management. He r/o for MI. Today he underwent cardiac catheterization where he was found to have severe 3 vessel CAD. LV EF was 55-65%. He currently is pain free.  Past Medical History   Diagnosis  Date   .  Hyperlipidemia    .  Coronary artery disease    .  Hypertriglyceridemia    .  Dyslipidemia    .  Tobacco abuse    .  AMI (acute myocardial infarction)  2005   .  Diabetes mellitus without complication     Past Surgical History   Procedure  Laterality  Date   .  Lumbar spine surgery   2005   .  Coronary stent placement   2005     LAD50/70, CFX OK, PL1 90, PL2 40, RCA 95->0 with 2.5 x 60 mm Taxus stent, EF 60   .  Angioplasty     .  Coronary angioplasty      History reviewed. No pertinent family history.  Social History: reports that he has never smoked. He does not have any smokeless tobacco history on file. He reports that he uses illicit drugs (Marijuana). He reports that he does not drink alcohol.  Allergies: No Known Allergies  Medications:  Prior to Admission:  Prescriptions prior to admission   Medication  Sig  Dispense  Refill   .  aspirin 325 MG tablet  Take 325 mg by mouth daily.     .   diazepam (VALIUM) 10 MG tablet  Take 10 mg by mouth every 6 (six) hours as needed for anxiety.     .  lisinopril (PRINIVIL,ZESTRIL) 20 MG tablet  Take 20 mg by mouth daily.     .  metoprolol tartrate (LOPRESSOR) 25 MG tablet  Take 25 mg by mouth 2 (two) times daily.     .  nitroGLYCERIN (NITROSTAT) 0.4 MG SL tablet  Place 0.4 mg under the tongue daily.     .  Omega-3 Fatty Acids (FISH OIL) 500 MG CAPS  Take 1 capsule by mouth 3 (three) times daily.     .  omeprazole (PRILOSEC) 40 MG capsule  Take 40 mg by mouth daily.     .  oxyCODONE-acetaminophen (PERCOCET) 10-325 MG per tablet  Take 1 tablet by mouth every 4 (four) hours as needed for pain.     .  simvastatin (ZOCOR) 40 MG tablet  Take 40 mg by mouth at bedtime.      Results for orders placed during the hospital encounter of 12/29/12 (from the past 48 hour(s))   CBC WITH DIFFERENTIAL Status:   None    Collection Time    12/29/12 10:30 AM   Result  Value  Range    WBC  8.6  4.0 - 10.5 K/uL    RBC  5.08  4.22 - 5.81 MIL/uL    Hemoglobin  16.2  13.0 - 17.0 g/dL    HCT  45.6  39.0 - 52.0 %    MCV  89.8  78.0 - 100.0 fL    MCH  31.9  26.0 - 34.0 pg    MCHC  35.5  30.0 - 36.0 g/dL    RDW  12.8  11.5 - 15.5 %    Platelets  198  150 - 400 K/uL    Neutrophils Relative  48  43 - 77 %    Neutro Abs  4.1  1.7 - 7.7 K/uL    Lymphocytes Relative  42  12 - 46 %    Lymphs Abs  3.6  0.7 - 4.0 K/uL    Monocytes Relative  6  3 - 12 %    Monocytes Absolute  0.5  0.1 - 1.0 K/uL    Eosinophils Relative  4  0 - 5 %    Eosinophils Absolute  0.4  0.0 - 0.7 K/uL    Basophils Relative  1  0 - 1 %    Basophils Absolute  0.0  0.0 - 0.1 K/uL   COMPREHENSIVE METABOLIC PANEL Status: Abnormal    Collection Time    12/29/12 10:30 AM   Result  Value  Range    Sodium  135  135 - 145 mEq/L    Comment:  POST-ULTRACENTRIFUGATION    Potassium  4.4  3.5 - 5.1 mEq/L    Chloride  97  96 - 112 mEq/L    CO2  22  19 - 32 mEq/L    Glucose, Bld  498 (*)  70 - 99 mg/dL     BUN  12  6 - 23 mg/dL    Creatinine, Ser  0.89  0.50 - 1.35 mg/dL    Calcium  9.4  8.4 - 10.5 mg/dL    Total Protein  7.6  6.0 - 8.3 g/dL    Albumin  4.0  3.5 - 5.2 g/dL    AST  32  0 - 37 U/L    ALT  67 (*)  0 - 53 U/L    Alkaline Phosphatase  101  39 - 117 U/L    Total Bilirubin  0.2 (*)  0.3 - 1.2 mg/dL    GFR calc non Af Amer  >90  >90 mL/min    GFR calc Af Amer  >90  >90 mL/min    Comment:      The eGFR has been calculated     using the CKD EPI equation.     This calculation has not been     validated in all clinical     situations.     eGFR's persistently     <90 mL/min signify     possible Chronic Kidney Disease.   TROPONIN I Status: None    Collection Time    12/29/12 10:30 AM   Result  Value  Range    Troponin I  <0.30  <0.30 ng/mL    Comment:      Due to the release kinetics of cTnI,     a negative result within the first hours     of the onset of symptoms does not rule out       myocardial infarction with certainty.     If myocardial infarction is still suspected,     repeat the test at appropriate intervals.   TROPONIN I Status: None    Collection Time    12/29/12 2:45 PM   Result  Value  Range    Troponin I  <0.30  <0.30 ng/mL    Comment:      Due to the release kinetics of cTnI,     a negative result within the first hours     of the onset of symptoms does not rule out     myocardial infarction with certainty.     If myocardial infarction is still suspected,     repeat the test at appropriate intervals.   GLUCOSE, CAPILLARY Status: Abnormal    Collection Time    12/29/12 4:56 PM   Result  Value  Range    Glucose-Capillary  288 (*)  70 - 99 mg/dL   TROPONIN I Status: None    Collection Time    12/29/12 5:35 PM   Result  Value  Range    Troponin I  <0.30  <0.30 ng/mL    Comment:      Due to the release kinetics of cTnI,     a negative result within the first hours     of the onset of symptoms does not rule out     myocardial infarction with  certainty.     If myocardial infarction is still suspected,     repeat the test at appropriate intervals.   PROTIME-INR Status: None    Collection Time    12/29/12 6:10 PM   Result  Value  Range    Prothrombin Time  14.4  11.6 - 15.2 seconds    INR  1.14  0.00 - 1.49   GLUCOSE, CAPILLARY Status: Abnormal    Collection Time    12/29/12 9:53 PM   Result  Value  Range    Glucose-Capillary  325 (*)  70 - 99 mg/dL    Comment 1  Notify RN    TROPONIN I Status: None    Collection Time    12/30/12 12:31 AM   Result  Value  Range    Troponin I  <0.30  <0.30 ng/mL    Comment:      Due to the release kinetics of cTnI,     a negative result within the first hours     of the onset of symptoms does not rule out     myocardial infarction with certainty.     If myocardial infarction is still suspected,     repeat the test at appropriate intervals.   HEPARIN LEVEL (UNFRACTIONATED) Status: Abnormal    Collection Time    12/30/12 12:31 AM   Result  Value  Range    Heparin Unfractionated  0.26 (*)  0.30 - 0.70 IU/mL    Comment:      IF HEPARIN RESULTS ARE BELOW     EXPECTED VALUES, AND PATIENT     DOSAGE HAS BEEN CONFIRMED,     SUGGEST FOLLOW UP TESTING     OF ANTITHROMBIN III LEVELS.   CBC Status: Abnormal    Collection Time    12/30/12 12:31 AM   Result  Value  Range    WBC  7.5  4.0 - 10.5 K/uL    RBC  4.66  4.22 - 5.81 MIL/uL    Hemoglobin  15.0  13.0 - 17.0 g/dL      HCT  41.6  39.0 - 52.0 %    MCV  89.3  78.0 - 100.0 fL    MCH  32.2  26.0 - 34.0 pg    MCHC  36.1 (*)  30.0 - 36.0 g/dL    RDW  13.0  11.5 - 15.5 %    Platelets  157  150 - 400 K/uL   HEMOGLOBIN A1C Status: Abnormal    Collection Time    12/30/12 5:00 AM   Result  Value  Range    Hemoglobin A1C  12.0 (*)  <5.7 %    Comment:  (NOTE)         According to the ADA Clinical Practice Recommendations for 2011, when     HbA1c is used as a screening test:     >=6.5% Diagnostic of Diabetes Mellitus     (if abnormal  result is confirmed)     5.7-6.4% Increased risk of developing Diabetes Mellitus     References:Diagnosis and Classification of Diabetes Mellitus,Diabetes     Care,2011,34(Suppl 1):S62-S69 and Standards of Medical Care in     Diabetes - 2011,Diabetes Care,2011,34 (Suppl 1):S11-S61.    Mean Plasma Glucose  298 (*)  <117 mg/dL   TROPONIN I Status: None    Collection Time    12/30/12 5:00 AM   Result  Value  Range    Troponin I  <0.30  <0.30 ng/mL    Comment:      Due to the release kinetics of cTnI,     a negative result within the first hours     of the onset of symptoms does not rule out     myocardial infarction with certainty.     If myocardial infarction is still suspected,     repeat the test at appropriate intervals.   CBC Status: None    Collection Time    12/30/12 5:00 AM   Result  Value  Range    WBC  8.1  4.0 - 10.5 K/uL    RBC  4.75  4.22 - 5.81 MIL/uL    Hemoglobin  14.9  13.0 - 17.0 g/dL    HCT  42.8  39.0 - 52.0 %    MCV  90.1  78.0 - 100.0 fL    MCH  31.4  26.0 - 34.0 pg    MCHC  34.8  30.0 - 36.0 g/dL    RDW  12.9  11.5 - 15.5 %    Platelets  168  150 - 400 K/uL   BASIC METABOLIC PANEL Status: Abnormal    Collection Time    12/30/12 5:00 AM   Result  Value  Range    Sodium  137  135 - 145 mEq/L    Potassium  4.1  3.5 - 5.1 mEq/L    Chloride  97  96 - 112 mEq/L    CO2  28  19 - 32 mEq/L    Glucose, Bld  227 (*)  70 - 99 mg/dL    BUN  12  6 - 23 mg/dL    Creatinine, Ser  0.88  0.50 - 1.35 mg/dL    Calcium  9.1  8.4 - 10.5 mg/dL    GFR calc non Af Amer  >90  >90 mL/min    GFR calc Af Amer  >90  >90 mL/min    Comment:      The eGFR has been calculated     using the CKD EPI equation.       This calculation has not been     validated in all clinical     situations.     eGFR's persistently     <90 mL/min signify     possible Chronic Kidney Disease.   LIPID PANEL Status: Abnormal    Collection Time    12/30/12 5:00 AM   Result  Value  Range     Cholesterol  186  0 - 200 mg/dL    Triglycerides  601 (*)  <150 mg/dL    HDL  34 (*)  >39 mg/dL    Total CHOL/HDL Ratio  5.5     VLDL  UNABLE TO CALCULATE IF TRIGLYCERIDE OVER 400 mg/dL  0 - 40 mg/dL    LDL Cholesterol  UNABLE TO CALCULATE IF TRIGLYCERIDE OVER 400 mg/dL  0 - 99 mg/dL    Comment:      Total Cholesterol/HDL:CHD Risk     Coronary Heart Disease Risk Table     Men Women     1/2 Average Risk 3.4 3.3     Average Risk 5.0 4.4     2 X Average Risk 9.6 7.1     3 X Average Risk 23.4 11.0         Use the calculated Patient Ratio     above and the CHD Risk Table     to determine the patient's CHD Risk.         ATP III CLASSIFICATION (LDL):     <100 mg/dL Optimal     100-129 mg/dL Near or Above     Optimal     130-159 mg/dL Borderline     160-189 mg/dL High     >190 mg/dL Very High   GLUCOSE, CAPILLARY Status: Abnormal    Collection Time    12/30/12 8:36 AM   Result  Value  Range    Glucose-Capillary  244 (*)  70 - 99 mg/dL   MRSA PCR SCREENING Status: None    Collection Time    12/30/12 10:03 AM   Result  Value  Range    MRSA by PCR  NEGATIVE  NEGATIVE    Comment:      The GeneXpert MRSA Assay (FDA     approved for NASAL specimens     only), is one component of a     comprehensive MRSA colonization     surveillance program. It is not     intended to diagnose MRSA     infection nor to guide or     monitor treatment for     MRSA infections.   GLUCOSE, CAPILLARY Status: Abnormal    Collection Time    12/30/12 11:51 AM   Result  Value  Range    Glucose-Capillary  234 (*)  70 - 99 mg/dL   GLUCOSE, CAPILLARY Status: Abnormal    Collection Time    12/30/12 4:37 PM   Result  Value  Range    Glucose-Capillary  309 (*)  70 - 99 mg/dL    Dg Chest Portable 1 View  12/29/2012 *RADIOLOGY REPORT* Clinical Data: Chest pain PORTABLE CHEST - 1 VIEW Comparison: 06/12/2004 Findings: Artifact overlies chest. Heart size is normal. Mediastinal shadows are normal. The vascularity  is normal. Lungs are clear. No effusions. No significant bony finding. IMPRESSION: No active disease Original Report Authenticated By: Mark Shogry, M.D.   Review of Systems  Constitutional: Positive for malaise/fatigue.  Respiratory: Negative for shortness of breath and wheezing.  Cardiovascular: Positive for chest pain.    Genitourinary: Positive for frequency.  Musculoskeletal: Positive for myalgias and back pain.  Walks with cane, chronic right leg weakness  Neurological: Positive for focal weakness (right leg).  All other systems reviewed and are negative.   Blood pressure 129/70, pulse 101, temperature 98.4 F (36.9 C), temperature source Oral, resp. rate 23, height 5' 10" (1.778 m), weight 245 lb 12.8 oz (111.494 kg), SpO2 99.00%.  Physical Exam  Vitals reviewed.  Constitutional: He is oriented to person, place, and time. He appears well-developed and well-nourished. No distress.  HENT:  Head: Normocephalic and atraumatic.  Eyes: EOM are normal. Pupils are equal, round, and reactive to light.  Neck: Neck supple. No thyromegaly present.  No carotid bruits  Cardiovascular: Normal rate, regular rhythm, normal heart sounds and intact distal pulses. Exam reveals no gallop and no friction rub.  No murmur heard. Normal Allen's test on left  Respiratory: Effort normal and breath sounds normal. He has no wheezes. He has no rales.  GI: Soft. There is no tenderness.  Musculoskeletal: He exhibits no edema.  Lymphadenopathy:  He has no cervical adenopathy.  Neurological: He is alert and oriented to person, place, and time. No cranial nerve deficit.  Skin: Skin is warm and dry.   CARDIAC CATHETERIZATION  Procedural Findings:  Hemodynamics:  AO 130 78  LV 130 8  Coronary angiography:  Coronary dominance: right  Left mainstem: 30%  Left anterior descending (LAD): 70% multiple discrete proximal and mid vessel  D1: 40% ostial  D2: small vessel  D3: small vessel  Left circumflex (LCx):  95% proximal disease  OM1: 40%  OM2: /AV groove 90%  Right coronary artery (RCA): Long area of 99% mid vessel disease with TIMI 2 flow  Left ventriculography: Left ventricular systolic function is normal, LVEF is estimated at 55-65%, there is no significant mitral regurgitation  Final Conclusions: Severe 3VD  Recommendations: CABG Dr Annalina Needles called transfer to unit start heparin    Assessment/Plan:  51 yo WM with multiple CRF and a history of CAD dating back to age 42, who presents with an unstable coronary syndrome. At cath he has severe 3 vessel CAD not amenable to PCI. CABG is indicated for survival benefit as well as relief of symptoms.  I have discussed with him the general nature of the procedure, need for general anesthesia, and incisions to be used. We discussed the expected hospital stay, overall recovery and short and long term outcomes. He understands the risks include, but are not limited to, death, stroke, MI, DVT/PE, bleeding, possible need for transfusion, infections, cardiac arrhythmias, and other organ system dysfunction including respiratory, renal, or GI complications. We discussed the possible use of the left radial artery and the potential for ischemic or other complications from harvest of that vessel. He understands and accepts these risks and agrees to proceed.  For OR in AM 2/19  Tamie Minteer C  12/30/2012, 5:15 PM       and accepts these risks and agrees to proceed.  For OR in AM 2/19  George [name] C 12/30/2012, 5:15 PM

## 2012-12-30 NOTE — Interval H&P Note (Signed)
History and Physical Interval Note:  12/30/2012 7:32 AM  George [name]  has presented today for surgery, with the diagnosis of Chest pain  The various methods of treatment have been discussed with the patient and family. After consideration of risks, benefits and other options for treatment, the patient has consented to  Procedure(s): LEFT HEART CATHETERIZATION WITH CORONARY ANGIOGRAM (N/A) as a surgical intervention .  The patient's history has been reviewed, patient examined, no change in status, stable for surgery.  I have reviewed the patient's chart and labs.  Questions were answered to the patient's satisfaction.     Charlton Haws

## 2012-12-30 NOTE — Progress Notes (Signed)
ANTICOAGULATION CONSULT NOTE - Follow-up Consult  Pharmacy Consult for Heparin Indication: chest pain/ACS  No Known Allergies  Patient Measurements: Height: 5\' 10"  (177.8 cm) Weight: 245 lb 12.8 oz (111.494 kg) IBW/kg (Calculated) : 73 Heparin Dosing Weight: 97.9 kg  Vital Signs: Temp: 98.1 F (36.7 C) (02/18 1957) Temp src: Oral (02/18 1957) BP: 129/70 mmHg (02/18 1630) Pulse Rate: 101 (02/18 1630)  Labs:  Recent Labs  12/29/12 1030  12/29/12 1735 12/29/12 1810 12/30/12 0031 12/30/12 0500 12/30/12 1936  HGB 16.2  --   --   --  15.0 14.9  --   HCT 45.6  --   --   --  41.6 42.8  --   PLT 198  --   --   --  157 168  --   APTT  --   --   --   --   --   --  62*  LABPROT  --   --   --  14.4  --   --   --   INR  --   --   --  1.14  --   --   --   HEPARINUNFRC  --   --   --   --  0.26*  --  0.22*  CREATININE 0.89  --   --   --   --  0.88  --   TROPONINI <0.30  < > <0.30  --  <0.30 <0.30  --   < > = values in this interval not displayed.  Estimated Creatinine Clearance: 125.6 ml/min (by C-G formula based on Cr of 0.88).   Medical History: Past Medical History  Diagnosis Date  . Hyperlipidemia   . Coronary artery disease   . Hypertriglyceridemia   . Dyslipidemia   . Tobacco abuse   . AMI (acute myocardial infarction) 2005  . Diabetes mellitus without complication     Medications:  Prescriptions prior to admission  Medication Sig Dispense Refill  . aspirin 325 MG tablet Take 325 mg by mouth daily.        . diazepam (VALIUM) 10 MG tablet Take 10 mg by mouth every 6 (six) hours as needed for anxiety.      [name] lisinopril (PRINIVIL,ZESTRIL) 20 MG tablet Take 20 mg by mouth daily.      . metoprolol tartrate (LOPRESSOR) 25 MG tablet Take 25 mg by mouth 2 (two) times daily.      . nitroGLYCERIN (NITROSTAT) 0.4 MG SL tablet Place 0.4 mg under the tongue daily.        . Omega-3 Fatty Acids (FISH OIL) 500 MG CAPS Take 1 capsule by mouth 3 (three) times daily.        [name]  omeprazole (PRILOSEC) 40 MG capsule Take 40 mg by mouth daily.        [name] oxyCODONE-acetaminophen (PERCOCET) 10-325 MG per tablet Take 1 tablet by mouth every 4 (four) hours as needed for pain.      . simvastatin (ZOCOR) 40 MG tablet Take 40 mg by mouth at bedtime.          Assessment: 52 y/o male who presented to [facility] with chest pain associated with SOB, lightheadedness and diaphoresis with some relief from nitroglycerin. Taken to cath lab this AM, and found with 3V CAD.  Plans for CABG tomorrow.   Pharmacy asked to resume IV heparin 2 hrs after radial band removed.  Per RN, band removed ~ 10:35 AM.  Heparin resumed at ~1430 and  HL drawn ~1hr early is subtherapeutic at 0.22.   Goal of Therapy:  Heparin level 0.3-0.7 units/ml Monitor platelets by anticoagulation protocol: Yes   Plan:  1. Increase heparin to 1850 units/hr  2. Check heparin level 6 hrs after rate inc, timed collect   Thank you,  Brett Fairy, PharmD, BCPS 12/30/2012 8:51 PM

## 2012-12-30 NOTE — Care Management Note (Addendum)
    Page 1 of 1   01/02/2013     3:11:47 PM   CARE MANAGEMENT NOTE 01/02/2013  Patient:  George [name], George [name]   Account Number:  [id number]  Date Initiated:  12/30/2012  Documentation initiated by:  Junius Creamer  Subjective/Objective Assessment:   adm w ch pain, pos cath, for cabg eval     Action/Plan:   pcp dr Karleen Hampshire   DC Planning Services  CM consult      Per UR Regulation:  Reviewed for med. necessity/level of care/duration of stay  Comments:  01/02/13 1439 Hideo Googe RN BSN MSN CCM Per spouse, financial counselor visited and obtained information she needed, also assisted in rescheduling pt's appt with [facility] DSS.  01/01/13 1215 Verdis Prime RN BSN MSN CCM Met with pt and spouse @ bedside.  Pt has no insurance but has been tentatively approved for SSI, meeting to finalize process was scheduled for today.  Spouse, Lawanna Kobus [phone]) has letter from [facility] DSS caseworker - letter copied and financial counselor notified.  If pt approved for SSI, Medicaid will also be approved.  2/18 1026a debbie dowell rn,bsn

## 2012-12-30 NOTE — Progress Notes (Signed)
VASCULAR LAB PRELIMINARY  PRELIMINARY  PRELIMINARY  PRELIMINARY  Pre-op Cardiac Surgery  Carotid Findings:  Bilateral:  No evidence of hemodynamically significant internal carotid artery stenosis.   Vertebral artery flow is antegrade.      Upper Extremity Right Left  Brachial Pressures    Radial Waveforms    Ulnar Waveforms    Palmar Arch (Allen's Test)     Findings:      Lower  Extremity Right Left  Dorsalis Pedis    Anterior Tibial    Posterior Tibial    Ankle/Brachial Indices      Findings:     George [name], RVT 12/30/2012, 2:34 PM

## 2012-12-30 NOTE — Progress Notes (Signed)
ANTICOAGULATION CONSULT NOTE - Follow-up Consult  Pharmacy Consult for Heparin Indication: chest pain/ACS  No Known Allergies  Patient Measurements: Height: 5\' 10"  (177.8 cm) Weight: 245 lb 12.8 oz (111.494 kg) IBW/kg (Calculated) : 73 Heparin Dosing Weight: 97.9 kg  Vital Signs: Temp: 97.8 F (36.6 C) (02/18 0553) Temp src: Oral (02/18 0553) BP: 106/68 mmHg (02/18 0553) Pulse Rate: 75 (02/18 0723)  Labs:  Recent Labs  12/29/12 1030  12/29/12 1735 12/29/12 1810 12/30/12 0031 12/30/12 0500  HGB 16.2  --   --   --  15.0 14.9  HCT 45.6  --   --   --  41.6 42.8  PLT 198  --   --   --  157 168  LABPROT  --   --   --  14.4  --   --   INR  --   --   --  1.14  --   --   HEPARINUNFRC  --   --   --   --  0.26*  --   CREATININE 0.89  --   --   --   --  0.88  TROPONINI <0.30  < > <0.30  --  <0.30 <0.30  < > = values in this interval not displayed.  Estimated Creatinine Clearance: 125.6 ml/min (by C-G formula based on Cr of 0.88).   Medical History: Past Medical History  Diagnosis Date  . Hyperlipidemia   . Coronary artery disease   . Hypertriglyceridemia   . Dyslipidemia   . Tobacco abuse   . AMI (acute myocardial infarction) 2005  . Diabetes mellitus without complication     Medications:  Prescriptions prior to admission  Medication Sig Dispense Refill  . aspirin 325 MG tablet Take 325 mg by mouth daily.        . diazepam (VALIUM) 10 MG tablet Take 10 mg by mouth every 6 (six) hours as needed for anxiety.      [name] lisinopril (PRINIVIL,ZESTRIL) 20 MG tablet Take 20 mg by mouth daily.      . metoprolol tartrate (LOPRESSOR) 25 MG tablet Take 25 mg by mouth 2 (two) times daily.      . nitroGLYCERIN (NITROSTAT) 0.4 MG SL tablet Place 0.4 mg under the tongue daily.        . Omega-3 Fatty Acids (FISH OIL) 500 MG CAPS Take 1 capsule by mouth 3 (three) times daily.        [name] omeprazole (PRILOSEC) 40 MG capsule Take 40 mg by mouth daily.        [name] oxyCODONE-acetaminophen  (PERCOCET) 10-325 MG per tablet Take 1 tablet by mouth every 4 (four) hours as needed for pain.      . simvastatin (ZOCOR) 40 MG tablet Take 40 mg by mouth at bedtime.          Assessment: 52 y/o male who presented to [facility] with chest pain associated with SOB, lightheadedness and diaphoresis with some relief from nitroglycerin. Taken to cath lab this AM, and found with 3V CAD.  Awaiting CABG consult.  Pharmacy asked to resume IV heparin 2 hrs after radial band removed.  Per RN, band removed ~ 10:35 AM.  CBC/Pltc stable.  Goal of Therapy:  Heparin level 0.3-0.7 units/ml Monitor platelets by anticoagulation protocol: Yes   Plan:  1. Resume heparin at 1600 units/hr at 1245 PM. 2. Check heparin level 6 hrs after gtt resumes. 3. Daily heparin level and CBC. 4. F/U plans for CABG.  Shanda Bumps  Michelle Piper, BCPS  Clinical Pharmacist Pager [phone]  12/30/2012 10:53 AM

## 2012-12-30 NOTE — Progress Notes (Signed)
Morphine 2mg  IV given @ 0940 was incorrectly linked to wrong IV site. Drug was administered through NSL in left forearm after assessing patency. NSL was flushed post administration.

## 2012-12-30 NOTE — CV Procedure (Signed)
   Cardiac Catheterization Procedure Note  Name: BLAYDEN CONWELL MRN: [id number] DOB: [birth year]  Procedure: Left Heart Cath, Selective Coronary Angiography, LV angiography  Indication: Angina   Procedural Details: The right wrist was prepped, draped, and anesthetized with 1% lidocaine. Using the modified Seldinger technique, a 5 French sheath was introduced into the right radial artery. 3 mg of verapamil was administered through the sheath, weight-based unfractionated heparin was administered intravenously. Standard Judkins catheters were used for selective coronary angiography and left ventriculography. Catheter exchanges were performed over an exchange length guidewire. There were no immediate procedural complications. A TR band was used for radial hemostasis at the completion of the procedure.  The patient was transferred to the post catheterization recovery area for further monitoring.  Procedural Findings: Hemodynamics: AO 130 78 LV 130 8  Coronary angiography: Coronary dominance: right  Left mainstem: 30%  Left anterior descending (LAD): 70% multiple discrete proximal and mid vessel  D1: 40% ostial   D2: small vessel  D3: small vessel  Left circumflex (LCx): 95% proximal disease  OM1: 40%  OM2: /AV groove 90%  Right coronary artery (RCA):  Long area of 99% mid vessel disease with TIMI 2 flow  Left ventriculography: Left ventricular systolic function is normal, LVEF is estimated at 55-65%, there is no significant mitral regurgitation   Final Conclusions:  Severe 3VD  Recommendations: CABG  Dr Dorris Fetch called transfer to unit start heparin   Charlton Haws 12/30/2012, 8:17 AM

## 2012-12-30 NOTE — Progress Notes (Addendum)
VASCULAR LAB PRELIMINARY  PRELIMINARY  PRELIMINARY  PRELIMINARY  Pre-op Cardiac Surgery  Carotid Findings:  Bilateral:  No evidence of hemodynamically significant internal carotid artery stenosis.   Vertebral artery flow is antegrade.      Upper Extremity Right Left  Brachial Pressures 115 Triphasic 115 Triphasic  Radial Waveforms Triphasic Triphasic  Ulnar Waveforms Triphasic Triphasic  Palmar Arch (Allen's Test) Normal Normal   Findings:  Doppler waveforms remained normal with both radial and ulnar compressions    Lower  Extremity Right Left  Dorsalis Pedis 128 Biphasic 96 Biphasic      Posterior Tibial 140 Biphasic 131 Biphasic  Ankle/Brachial Indices 1.22 1.14    Findings:  ABIs and Doppler waveforms are within normal limits bilaterally at rest.   Glenette Bookwalter, RVS 12/30/2012, 5:30 PM

## 2012-12-30 NOTE — Progress Notes (Signed)
Patient ID: George [name], male   DOB: Nov 04, 1961, 52 y.o.   MRN: [id number]    Subjective:  Denies SSCP, palpitations or Dyspnea   Objective:  Filed Vitals:   12/29/12 1654 12/29/12 2156 12/30/12 0553 12/30/12 0723  BP:  115/70 106/68   Pulse:  79 66 75  Temp:  98.1 F (36.7 C) 97.8 F (36.6 C)   TempSrc:  Oral Oral   Resp:  18 18   Height: 5\' 10"  (1.778 m)     Weight: 250 lb (113.399 kg)  245 lb 12.8 oz (111.494 kg)   SpO2:  98% 98%     Intake/Output from previous day:  Intake/Output Summary (Last 24 hours) at 12/30/12 0947 Last data filed at 12/30/12 0830  Gross per 24 hour  Intake    240 ml  Output    800 ml  Net   -560 ml    Physical Exam: Affect appropriate Healthy:  appears stated age HEENT: normal Neck supple with no adenopathy JVP normal no bruits no thyromegaly Lungs clear with no wheezing and good diaphragmatic motion Heart:  S1/S2 no murmur, no rub, gallop or click PMI normal Abdomen: benighn, BS positve, no tenderness, no AAA no bruit.  No HSM or HJR Distal pulses intact with no bruits No edema Neuro non-focal Skin warm and dry No muscular weakness   Lab Results: Basic Metabolic Panel:  Recent Labs  78/29/56 1030 12/30/12 0500  NA 135 137  K 4.4 4.1  CL 97 97  CO2 22 28  GLUCOSE 498* 227*  BUN 12 12  CREATININE 0.89 0.88  CALCIUM 9.4 9.1   Liver Function Tests:  Recent Labs  12/29/12 1030  AST 32  ALT 67*  ALKPHOS 101  BILITOT 0.2*  PROT 7.6  ALBUMIN 4.0   CBC:  Recent Labs  12/29/12 1030 12/30/12 0031 12/30/12 0500  WBC 8.6 7.5 8.1  NEUTROABS 4.1  --   --   HGB 16.2 15.0 14.9  HCT 45.6 41.6 42.8  MCV 89.8 89.3 90.1  PLT 198 157 168   Cardiac Enzymes:  Recent Labs  12/29/12 1735 12/30/12 0031 12/30/12 0500  TROPONINI <0.30 <0.30 <0.30   Fasting Lipid Panel:  Recent Labs  12/30/12 0500  CHOL 186  HDL 34*  LDLCALC UNABLE TO CALCULATE IF TRIGLYCERIDE OVER 400 mg/dL  TRIG 213*  CHOLHDL 5.5    Imaging: Dg Chest Portable 1 View  12/29/2012  *RADIOLOGY REPORT*  Clinical Data: Chest pain  PORTABLE CHEST - 1 VIEW  Comparison: 06/12/2004  Findings: Artifact overlies chest.  Heart size is normal. Mediastinal shadows are normal.  The vascularity is normal.  Lungs are clear.  No effusions.  No significant bony finding.  IMPRESSION: No active disease   Original Report Authenticated By: Paulina Fusi, M.D.     Cardiac Studies:  ECG:  NSR normal no acute ischemic changes   Telemetry: NSR no VT 12/30/2012   Echo:   Medications:   . [facility] HOLD] aspirin  325 mg Oral Daily  . [MAR HOLD] insulin aspart  0-15 Units Subcutaneous TID WC  . [MAR HOLD] insulin aspart  0-5 Units Subcutaneous QHS  . [MAR HOLD] insulin aspart  4 Units Subcutaneous TID WC  . [MAR HOLD] lisinopril  20 mg Oral Daily  . [facility] HOLD] living well with diabetes book   Does not apply Once  . [facility] HOLD] metoprolol tartrate  25 mg Oral BID  . [MAR HOLD] omega-3 acid ethyl esters  1  g Oral BID  . [MAR HOLD] pantoprazole  40 mg Oral Daily  . [facility] HOLD] simvastatin  40 mg Oral QHS  . [MAR HOLD] sodium chloride  3 mL Intravenous Q12H     . heparin 1,600 Units/hr (12/30/12 0216)  . [facility] HOLD] nitroGLYCERIN 10 mcg/min (12/30/12 1610)    Assessment/Plan:  DM:  HbA1c pending Dietary consult.  SS insulin in hospital Consider glucotrol/glucophage before discharge and f/u [facility] in [place] as primary Chol:  Low dose statin for CAD and trilipix for trygycerides  Dietary consult CAD:  Dr Dorris Fetch called for CABG  Charlton Haws 12/30/2012, 9:47 AM

## 2012-12-31 ENCOUNTER — Inpatient Hospital Stay (HOSPITAL_COMMUNITY): Payer: Medicaid Other

## 2012-12-31 ENCOUNTER — Encounter (HOSPITAL_COMMUNITY): Payer: Self-pay | Admitting: Anesthesiology

## 2012-12-31 ENCOUNTER — Inpatient Hospital Stay (HOSPITAL_COMMUNITY): Payer: Medicaid Other | Admitting: Anesthesiology

## 2012-12-31 ENCOUNTER — Encounter (HOSPITAL_COMMUNITY)
Admission: EM | Disposition: A | Discharge status: Home / Self Care | Payer: Self-pay | Source: Home / Self Care | Attending: Thoracic Surgery (Cardiothoracic Vascular Surgery)

## 2012-12-31 DIAGNOSIS — I2510 Atherosclerotic heart disease of native coronary artery without angina pectoris: Secondary | ICD-10-CM

## 2012-12-31 HISTORY — PX: CORONARY ARTERY BYPASS GRAFT: SHX141

## 2012-12-31 HISTORY — PX: RADIAL ARTERY HARVEST: SHX5067

## 2012-12-31 LAB — POCT I-STAT 3, ART BLOOD GAS (G3+)
Acid-base deficit: 2 mmol/L (ref 0.0–2.0)
Acid-base deficit: 4 mmol/L — ABNORMAL HIGH (ref 0.0–2.0)
Bicarbonate: 23.8 mEq/L (ref 20.0–24.0)
O2 Saturation: 100 %
O2 Saturation: 93 %
Patient temperature: 37.1
Patient temperature: 37.2
TCO2: 24 mmol/L (ref 0–100)
TCO2: 22 mmol/L (ref 0–100)
pCO2 arterial: 43.2 mmHg (ref 35.0–45.0)
pCO2 arterial: 40.3 mmHg (ref 35.0–45.0)
pCO2 arterial: 38.9 mmHg (ref 35.0–45.0)
pH, Arterial: 7.319 — ABNORMAL LOW (ref 7.350–7.450)
pH, Arterial: 7.336 — ABNORMAL LOW (ref 7.350–7.450)
pO2, Arterial: 179 mmHg — ABNORMAL HIGH (ref 80.0–100.0)

## 2012-12-31 LAB — CBC
HCT: 40.4 % (ref 39.0–52.0)
HCT: 38.2 % — ABNORMAL LOW (ref 39.0–52.0)
MCHC: 34.6 g/dL (ref 30.0–36.0)
MCV: 89.9 fL (ref 78.0–100.0)
Platelets: 153 10*3/uL (ref 150–400)
RBC: 4.57 MIL/uL (ref 4.22–5.81)
RBC: 4.25 MIL/uL (ref 4.22–5.81)
RDW: 12.9 % (ref 11.5–15.5)
RDW: 12.9 % (ref 11.5–15.5)
RDW: 13.1 % (ref 11.5–15.5)
WBC: 9.7 10*3/uL (ref 4.0–10.5)
WBC: 18.1 10*3/uL — ABNORMAL HIGH (ref 4.0–10.5)

## 2012-12-31 LAB — POCT I-STAT 4, (NA,K, GLUC, HGB,HCT)
Glucose, Bld: 252 mg/dL — ABNORMAL HIGH (ref 70–99)
HCT: 30 % — ABNORMAL LOW (ref 39.0–52.0)
HCT: 28 % — ABNORMAL LOW (ref 39.0–52.0)
Hemoglobin: 12.2 g/dL — ABNORMAL LOW (ref 13.0–17.0)
Hemoglobin: 9.5 g/dL — ABNORMAL LOW (ref 13.0–17.0)
Hemoglobin: 12.2 g/dL — ABNORMAL LOW (ref 13.0–17.0)
Potassium: 4.4 mEq/L (ref 3.5–5.1)
Potassium: 4.5 mEq/L (ref 3.5–5.1)
Potassium: 4.9 mEq/L (ref 3.5–5.1)
Potassium: 4.3 mEq/L (ref 3.5–5.1)
Potassium: 3.8 mEq/L (ref 3.5–5.1)
Sodium: 136 mEq/L (ref 135–145)
Sodium: 134 mEq/L — ABNORMAL LOW (ref 135–145)
Sodium: 134 mEq/L — ABNORMAL LOW (ref 135–145)

## 2012-12-31 LAB — COMPREHENSIVE METABOLIC PANEL
ALT: 65 U/L — ABNORMAL HIGH (ref 0–53)
Alkaline Phosphatase: 83 U/L (ref 39–117)
BUN: 10 mg/dL (ref 6–23)
CO2: 24 mEq/L (ref 19–32)
Chloride: 102 mEq/L (ref 96–112)
GFR calc Af Amer: >90 mL/min (ref >90)
GFR calc non Af Amer: >90 mL/min (ref >90)
Glucose, Bld: 217 mg/dL — ABNORMAL HIGH (ref 70–99)
Potassium: 3.8 mEq/L (ref 3.5–5.1)
Sodium: 137 mEq/L (ref 135–145)
Total Bilirubin: 0.6 mg/dL (ref 0.3–1.2)

## 2012-12-31 LAB — BLOOD GAS, ARTERIAL
Acid-Base Excess: 0.2 mmol/L (ref 0.0–2.0)
FIO2: 0.21 %
O2 Saturation: 95.8 %
Patient temperature: 98.6
TCO2: 25 mmol/L (ref 0–100)
pCO2 arterial: 35.9 mmHg (ref 35.0–45.0)

## 2012-12-31 LAB — POCT I-STAT, CHEM 8
Calcium, Ion: 1.19 mmol/L (ref 1.12–1.23)
Chloride: 108 mEq/L (ref 96–112)
Glucose, Bld: 188 mg/dL — ABNORMAL HIGH (ref 70–99)
HCT: 38 % — ABNORMAL LOW (ref 39.0–52.0)
TCO2: 23 mmol/L (ref 0–100)

## 2012-12-31 LAB — PROTIME-INR
INR: 1.33 (ref 0.00–1.49)
Prothrombin Time: 16.2 seconds — ABNORMAL HIGH (ref 11.6–15.2)

## 2012-12-31 LAB — CREATININE, SERUM
Creatinine, Ser: 0.85 mg/dL (ref 0.50–1.35)
GFR calc Af Amer: >90 mL/min (ref >90)
GFR calc non Af Amer: >90 mL/min (ref >90)

## 2012-12-31 LAB — GLUCOSE, CAPILLARY: Glucose-Capillary: 160 mg/dL — ABNORMAL HIGH (ref 70–99)

## 2012-12-31 LAB — PLATELET COUNT: Platelets: 125 10*3/uL — ABNORMAL LOW (ref 150–400)

## 2012-12-31 LAB — SURGICAL PCR SCREEN: Staphylococcus aureus: NEGATIVE

## 2012-12-31 SURGERY — CORONARY ARTERY BYPASS GRAFTING (CABG)
Anesthesia: General | Site: Chest | Wound class: Clean

## 2012-12-31 MED ORDER — SODIUM CHLORIDE 0.9 % IJ SOLN
3.0000 mL | Freq: Two times a day (BID) | INTRAMUSCULAR | Status: DC
  Administered 2013-01-01: 22:00:00 via INTRAVENOUS
  Administered 2013-01-01 – 2013-01-02 (×2): 3 mL via INTRAVENOUS

## 2012-12-31 MED ORDER — SODIUM CHLORIDE 0.9 % IV SOLN
100.0000 [IU] | INTRAVENOUS | Status: DC | PRN
  Administered 2012-12-31: 5.3 [IU]/h via INTRAVENOUS

## 2012-12-31 MED ORDER — OXYCODONE HCL 5 MG PO TABS
5.0000 mg | ORAL_TABLET | ORAL | Status: DC | PRN
  Administered 2012-12-31 – 2013-01-01 (×6): 10 mg via ORAL
  Administered 2013-01-01: 5 mg via ORAL
  Administered 2013-01-02 – 2013-01-03 (×7): 10 mg via ORAL
  Administered 2013-01-03: 5 mg via ORAL
  Administered 2013-01-03 (×2): 10 mg via ORAL
  Administered 2013-01-03: 5 mg via ORAL
  Administered 2013-01-04 – 2013-01-05 (×6): 10 mg via ORAL
  Filled 2012-12-31: qty 2
  Filled 2012-12-31 (×2): qty 1
  Filled 2012-12-31 (×8): qty 2
  Filled 2012-12-31: qty 1
  Filled 2012-12-31 (×12): qty 2

## 2012-12-31 MED ORDER — NITROGLYCERIN IN D5W 200-5 MCG/ML-% IV SOLN: 0.0000 ug/min | INTRAVENOUS | Status: DC

## 2012-12-31 MED ORDER — PANTOPRAZOLE SODIUM 40 MG PO TBEC: 40.0000 mg | DELAYED_RELEASE_TABLET | Freq: Every day | ORAL | Status: DC

## 2012-12-31 MED ORDER — LIDOCAINE HCL (CARDIAC) 20 MG/ML IV SOLN
INTRAVENOUS | Status: DC | PRN
  Administered 2012-12-31: 100 mg via INTRAVENOUS

## 2012-12-31 MED ORDER — INSULIN REGULAR BOLUS VIA INFUSION
0.0000 [IU] | Freq: Three times a day (TID) | INTRAVENOUS | Status: DC
  Filled 2012-12-31: qty 10

## 2012-12-31 MED ORDER — FENTANYL CITRATE 0.05 MG/ML IJ SOLN
INTRAMUSCULAR | Status: DC | PRN
  Administered 2012-12-31 (×3): 250 ug via INTRAVENOUS
  Administered 2012-12-31 (×2): 100 ug via INTRAVENOUS
  Administered 2012-12-31: 50 ug via INTRAVENOUS
  Administered 2012-12-31: 100 ug via INTRAVENOUS
  Administered 2012-12-31: 500 ug via INTRAVENOUS
  Administered 2012-12-31 (×2): 100 ug via INTRAVENOUS
  Administered 2012-12-31: 250 ug via INTRAVENOUS
  Administered 2012-12-31: 100 ug via INTRAVENOUS
  Administered 2012-12-31: 200 ug via INTRAVENOUS
  Administered 2012-12-31: 150 ug via INTRAVENOUS

## 2012-12-31 MED ORDER — ONDANSETRON HCL 4 MG/2ML IJ SOLN: 4.0000 mg | Freq: Four times a day (QID) | INTRAMUSCULAR | Status: DC | PRN

## 2012-12-31 MED ORDER — SODIUM CHLORIDE 0.9 % IV SOLN: 250.0000 mL | INTRAVENOUS | Status: DC | PRN

## 2012-12-31 MED ORDER — VANCOMYCIN HCL IN DEXTROSE 1-5 GM/200ML-% IV SOLN
1000.0000 mg | Freq: Once | INTRAVENOUS | Status: AC
  Administered 2012-12-31: 1000 mg via INTRAVENOUS
  Filled 2012-12-31: qty 200

## 2012-12-31 MED ORDER — PROPOFOL 10 MG/ML IV BOLUS
INTRAVENOUS | Status: DC | PRN
  Administered 2012-12-31: 60 mg via INTRAVENOUS

## 2012-12-31 MED ORDER — ALBUMIN HUMAN 5 % IV SOLN
INTRAVENOUS | Status: DC | PRN
  Administered 2012-12-31: 13:00:00 via INTRAVENOUS

## 2012-12-31 MED ORDER — SODIUM CHLORIDE 0.9 % IV SOLN
5.0000 g/h | Freq: Once | INTRAVENOUS | Status: DC
  Filled 2012-12-31: qty 20

## 2012-12-31 MED ORDER — PHENYLEPHRINE HCL 10 MG/ML IJ SOLN
0.0000 ug/min | INTRAMUSCULAR | Status: DC
  Administered 2012-12-31: 30 ug/min via INTRAVENOUS
  Filled 2012-12-31 (×2): qty 2

## 2012-12-31 MED ORDER — VECURONIUM BROMIDE 10 MG IV SOLR
INTRAVENOUS | Status: DC | PRN
  Administered 2012-12-31: 4 mg via INTRAVENOUS
  Administered 2012-12-31: 6 mg via INTRAVENOUS
  Administered 2012-12-31: 4 mg via INTRAVENOUS
  Administered 2012-12-31: 6 mg via INTRAVENOUS

## 2012-12-31 MED ORDER — METOPROLOL TARTRATE 25 MG/10 ML ORAL SUSPENSION
12.5000 mg | Freq: Two times a day (BID) | ORAL | Status: DC
  Filled 2012-12-31 (×7): qty 5

## 2012-12-31 MED ORDER — METOPROLOL TARTRATE 1 MG/ML IV SOLN
2.5000 mg | INTRAVENOUS | Status: DC | PRN
  Administered 2013-01-01: 5 mg via INTRAVENOUS
  Filled 2012-12-31: qty 5

## 2012-12-31 MED ORDER — ALBUMIN HUMAN 5 % IV SOLN
250.0000 mL | INTRAVENOUS | Status: AC | PRN
  Administered 2012-12-31 (×2): 250 mL via INTRAVENOUS

## 2012-12-31 MED ORDER — METOPROLOL TARTRATE 12.5 MG HALF TABLET
12.5000 mg | ORAL_TABLET | Freq: Two times a day (BID) | ORAL | Status: DC
  Administered 2013-01-01 – 2013-01-02 (×4): 12.5 mg via ORAL
  Filled 2012-12-31 (×7): qty 1

## 2012-12-31 MED ORDER — MAGNESIUM SULFATE 40 MG/ML IJ SOLN
4.0000 g | Freq: Once | INTRAMUSCULAR | Status: AC
  Administered 2012-12-31: 4 g via INTRAVENOUS
  Filled 2012-12-31: qty 100

## 2012-12-31 MED ORDER — PROPOFOL 10 MG/ML IV BOLUS: INTRAVENOUS | Status: DC | PRN

## 2012-12-31 MED ORDER — HEMOSTATIC AGENTS (NO CHARGE) OPTIME
TOPICAL | Status: DC | PRN
  Administered 2012-12-31: 1 via TOPICAL

## 2012-12-31 MED ORDER — PROTAMINE SULFATE 10 MG/ML IV SOLN
INTRAVENOUS | Status: DC | PRN
  Administered 2012-12-31: 31 mg via INTRAVENOUS

## 2012-12-31 MED ORDER — BISACODYL 10 MG RE SUPP: 10.0000 mg | Freq: Every day | RECTAL | Status: DC

## 2012-12-31 MED ORDER — MIDAZOLAM HCL 2 MG/2ML IJ SOLN: 2.0000 mg | INTRAMUSCULAR | Status: DC | PRN

## 2012-12-31 MED ORDER — DEXMEDETOMIDINE HCL IN NACL 400 MCG/100ML IV SOLN
0.4000 ug/kg/h | INTRAVENOUS | Status: DC
  Filled 2012-12-31: qty 100

## 2012-12-31 MED ORDER — SODIUM CHLORIDE 0.9 % IV SOLN
INTRAVENOUS | Status: DC
  Administered 2012-12-31: 9.6 [IU]/h via INTRAVENOUS
  Filled 2012-12-31 (×3): qty 1

## 2012-12-31 MED ORDER — ACETAMINOPHEN 160 MG/5ML PO SOLN
975.0000 mg | Freq: Four times a day (QID) | ORAL | Status: DC
  Filled 2012-12-31: qty 40.6

## 2012-12-31 MED ORDER — ARTIFICIAL TEARS OP OINT
TOPICAL_OINTMENT | OPHTHALMIC | Status: DC | PRN
  Administered 2012-12-31: 1 via OPHTHALMIC

## 2012-12-31 MED ORDER — LACTATED RINGERS IV SOLN
INTRAVENOUS | Status: DC | PRN
  Administered 2012-12-31: 08:00:00 via INTRAVENOUS

## 2012-12-31 MED ORDER — LACTATED RINGERS IV SOLN
INTRAVENOUS | Status: DC | PRN
  Administered 2012-12-31 (×2): via INTRAVENOUS

## 2012-12-31 MED ORDER — ACETAMINOPHEN 10 MG/ML IV SOLN
1000.0000 mg | Freq: Once | INTRAVENOUS | Status: AC
  Administered 2012-12-31: 1000 mg via INTRAVENOUS
  Filled 2012-12-31: qty 100

## 2012-12-31 MED ORDER — ROCURONIUM BROMIDE 100 MG/10ML IV SOLN
INTRAVENOUS | Status: DC | PRN
  Administered 2012-12-31 (×2): 50 mg via INTRAVENOUS

## 2012-12-31 MED ORDER — LACTATED RINGERS IV SOLN: INTRAVENOUS | Status: DC

## 2012-12-31 MED ORDER — ASPIRIN EC 325 MG PO TBEC
325.0000 mg | DELAYED_RELEASE_TABLET | Freq: Every day | ORAL | Status: DC
  Administered 2013-01-01 – 2013-01-04 (×4): 325 mg via ORAL
  Filled 2012-12-31 (×5): qty 1

## 2012-12-31 MED ORDER — DEXMEDETOMIDINE HCL IN NACL 200 MCG/50ML IV SOLN: 0.1000 ug/kg/h | INTRAVENOUS | Status: DC

## 2012-12-31 MED ORDER — DEXTROSE 5 % IV SOLN
1.5000 g | Freq: Two times a day (BID) | INTRAVENOUS | Status: DC
  Administered 2012-12-31 – 2013-01-01 (×3): 1.5 g via INTRAVENOUS
  Filled 2012-12-31 (×4): qty 1.5

## 2012-12-31 MED ORDER — MORPHINE SULFATE 2 MG/ML IJ SOLN
2.0000 mg | INTRAMUSCULAR | Status: DC | PRN
  Administered 2012-12-31: 2 mg via INTRAVENOUS
  Administered 2012-12-31: 4 mg via INTRAVENOUS
  Administered 2012-12-31 – 2013-01-01 (×5): 2 mg via INTRAVENOUS
  Administered 2013-01-01: 4 mg via INTRAVENOUS
  Administered 2013-01-01 (×3): 2 mg via INTRAVENOUS
  Administered 2013-01-01: 4 mg via INTRAVENOUS
  Administered 2013-01-02: 2 mg via INTRAVENOUS
  Filled 2012-12-31 (×2): qty 2
  Filled 2012-12-31 (×10): qty 1
  Filled 2012-12-31: qty 2
  Filled 2012-12-31: qty 1

## 2012-12-31 MED ORDER — SODIUM CHLORIDE 0.9 % IV SOLN: INTRAVENOUS | Status: DC

## 2012-12-31 MED ORDER — METOCLOPRAMIDE HCL 5 MG/ML IJ SOLN
10.0000 mg | Freq: Four times a day (QID) | INTRAMUSCULAR | Status: AC
  Administered 2012-12-31 – 2013-01-01 (×3): 10 mg via INTRAVENOUS
  Filled 2012-12-31 (×4): qty 2

## 2012-12-31 MED ORDER — BISACODYL 5 MG PO TBEC
10.0000 mg | DELAYED_RELEASE_TABLET | Freq: Every day | ORAL | Status: DC
  Administered 2013-01-03 – 2013-01-04 (×2): 10 mg via ORAL
  Filled 2012-12-31 (×3): qty 2

## 2012-12-31 MED ORDER — HEPARIN SODIUM (PORCINE) 1000 UNIT/ML IJ SOLN
INTRAMUSCULAR | Status: DC | PRN
  Administered 2012-12-31: 2000 [IU] via INTRAVENOUS
  Administered 2012-12-31: 27000 [IU] via INTRAVENOUS

## 2012-12-31 MED ORDER — ACETAMINOPHEN 500 MG PO TABS
1000.0000 mg | ORAL_TABLET | Freq: Four times a day (QID) | ORAL | Status: DC
  Administered 2013-01-01 – 2013-01-05 (×13): 1000 mg via ORAL
  Filled 2012-12-31 (×21): qty 2

## 2012-12-31 MED ORDER — LABETALOL HCL 5 MG/ML IV SOLN
INTRAVENOUS | Status: DC | PRN
  Administered 2012-12-31: 7.5 mg via INTRAVENOUS
  Administered 2012-12-31: 5 mg via INTRAVENOUS

## 2012-12-31 MED ORDER — MORPHINE SULFATE 2 MG/ML IJ SOLN
1.0000 mg | INTRAMUSCULAR | Status: AC | PRN
  Administered 2012-12-31 (×3): 2 mg via INTRAVENOUS
  Filled 2012-12-31: qty 2

## 2012-12-31 MED ORDER — LACTATED RINGERS IV SOLN: 500.0000 mL | Freq: Once | INTRAVENOUS | Status: AC | PRN

## 2012-12-31 MED ORDER — ASPIRIN 81 MG PO CHEW
324.0000 mg | CHEWABLE_TABLET | Freq: Every day | ORAL | Status: DC
  Filled 2012-12-31: qty 1

## 2012-12-31 MED ORDER — FAMOTIDINE IN NACL 20-0.9 MG/50ML-% IV SOLN
20.0000 mg | Freq: Two times a day (BID) | INTRAVENOUS | Status: AC
  Administered 2012-12-31: 20 mg via INTRAVENOUS

## 2012-12-31 MED ORDER — SODIUM CHLORIDE 0.9 % IJ SOLN: 3.0000 mL | INTRAMUSCULAR | Status: DC | PRN

## 2012-12-31 MED ORDER — POTASSIUM CHLORIDE 10 MEQ/50ML IV SOLN
10.0000 meq | INTRAVENOUS | Status: AC
  Administered 2012-12-31 (×3): 10 meq via INTRAVENOUS

## 2012-12-31 MED ORDER — KETOROLAC TROMETHAMINE 15 MG/ML IJ SOLN
15.0000 mg | Freq: Four times a day (QID) | INTRAMUSCULAR | Status: DC
  Administered 2012-12-31 – 2013-01-02 (×6): 15 mg via INTRAVENOUS
  Filled 2012-12-31 (×9): qty 1

## 2012-12-31 MED ORDER — SODIUM CHLORIDE 0.9 % IJ SOLN
OROMUCOSAL | Status: DC | PRN
  Administered 2012-12-31 (×3): via TOPICAL

## 2012-12-31 MED ORDER — MIDAZOLAM HCL 5 MG/5ML IJ SOLN
INTRAMUSCULAR | Status: DC | PRN
  Administered 2012-12-31: 5 mg via INTRAVENOUS
  Administered 2012-12-31: 4 mg via INTRAVENOUS
  Administered 2012-12-31: 5 mg via INTRAVENOUS
  Administered 2012-12-31 (×3): 2 mg via INTRAVENOUS

## 2012-12-31 MED ORDER — SODIUM CHLORIDE 0.45 % IV SOLN: INTRAVENOUS | Status: DC

## 2012-12-31 MED ORDER — DOCUSATE SODIUM 100 MG PO CAPS
200.0000 mg | ORAL_CAPSULE | Freq: Every day | ORAL | Status: DC
  Administered 2013-01-01 – 2013-01-04 (×3): 200 mg via ORAL
  Filled 2012-12-31 (×6): qty 2

## 2012-12-31 SURGICAL SUPPLY — 93 items
APPLIER CLIP 9.375 SM OPEN (CLIP)
ATTRACTOMAT 16X20 MAGNETIC DRP (DRAPES) ×4 IMPLANT
BAG DECANTER FOR FLEXI CONT (MISCELLANEOUS) ×4 IMPLANT
BANDAGE ELASTIC 4 VELCRO ST LF (GAUZE/BANDAGES/DRESSINGS) ×4 IMPLANT
BANDAGE ELASTIC 6 VELCRO ST LF (GAUZE/BANDAGES/DRESSINGS) ×4 IMPLANT
BANDAGE GAUZE ELAST BULKY 4 IN (GAUZE/BANDAGES/DRESSINGS) ×4 IMPLANT
BASKET HEART (ORDER IN 25'S) (MISCELLANEOUS) ×1
BASKET HEART (ORDER IN 25S) (MISCELLANEOUS) ×3 IMPLANT
BLADE STERNUM SYSTEM 6 (BLADE) ×4 IMPLANT
BLADE SURG 15 STRL LF DISP TIS (BLADE) ×3 IMPLANT
BLADE SURG 15 STRL SS (BLADE) ×1
CANISTER SUCTION 2500CC (MISCELLANEOUS) ×4 IMPLANT
CATH CPB KIT HENDRICKSON (MISCELLANEOUS) ×4 IMPLANT
CATH ROBINSON RED A/P 18FR (CATHETERS) ×4 IMPLANT
CATH THORACIC 36FR (CATHETERS) ×4 IMPLANT
CATH THORACIC 36FR RT ANG (CATHETERS) ×4 IMPLANT
CLIP APPLIE 9.375 SM OPEN (CLIP) IMPLANT
CLIP TI MEDIUM 24 (CLIP) IMPLANT
CLIP TI WIDE RED SMALL 24 (CLIP) ×12 IMPLANT
CLOTH BEACON ORANGE TIMEOUT ST (SAFETY) ×4 IMPLANT
COVER MAYO STAND STRL (DRAPES) IMPLANT
COVER SURGICAL LIGHT HANDLE (MISCELLANEOUS) ×4 IMPLANT
COVER TABLE BACK 60X90 (DRAPES) ×8 IMPLANT
CRADLE DONUT ADULT HEAD (MISCELLANEOUS) ×4 IMPLANT
DRAPE CARDIOVASCULAR INCISE (DRAPES) ×1
DRAPE EXTREMITY T 121X128X90 (DRAPE) IMPLANT
DRAPE PROXIMA HALF (DRAPES) IMPLANT
DRAPE SLUSH/WARMER DISC (DRAPES) ×4 IMPLANT
DRAPE SRG 135X102X78XABS (DRAPES) ×3 IMPLANT
DRSG COVADERM 4X14 (GAUZE/BANDAGES/DRESSINGS) ×4 IMPLANT
ELECT REM PT RETURN 9FT ADLT (ELECTROSURGICAL) ×8
ELECTRODE REM PT RTRN 9FT ADLT (ELECTROSURGICAL) ×6 IMPLANT
GEL ULTRASOUND 20GR AQUASONIC (MISCELLANEOUS) IMPLANT
GLOVE BIO SURGEON STRL SZ 6 (GLOVE) ×16 IMPLANT
GLOVE BIOGEL PI IND STRL 6 (GLOVE) ×18 IMPLANT
GLOVE BIOGEL PI IND STRL 7.0 (GLOVE) ×27 IMPLANT
GLOVE BIOGEL PI INDICATOR 6 (GLOVE) ×6
GLOVE BIOGEL PI INDICATOR 7.0 (GLOVE) ×9
GLOVE EUDERMIC 7 POWDERFREE (GLOVE) ×12 IMPLANT
GOWN PREVENTION PLUS XLARGE (GOWN DISPOSABLE) ×8 IMPLANT
GOWN STRL NON-REIN LRG LVL3 (GOWN DISPOSABLE) ×24 IMPLANT
HARMONIC SHEARS 14CM COAG (MISCELLANEOUS) ×8 IMPLANT
HEMOSTAT POWDER SURGIFOAM 1G (HEMOSTASIS) ×12 IMPLANT
HEMOSTAT SURGICEL 2X14 (HEMOSTASIS) ×4 IMPLANT
INSERT FOGARTY XLG (MISCELLANEOUS) IMPLANT
IV CATH 22GX1 FEP (IV SOLUTION) ×4 IMPLANT
KIT BASIN OR (CUSTOM PROCEDURE TRAY) ×4 IMPLANT
KIT ROOM TURNOVER OR (KITS) ×8 IMPLANT
KIT SUCTION CATH 14FR (SUCTIONS) ×8 IMPLANT
KIT VASOVIEW W/TROCAR VH 2000 (KITS) ×4 IMPLANT
MARKER GRAFT CORONARY BYPASS (MISCELLANEOUS) ×12 IMPLANT
NS IRRIG 1000ML POUR BTL (IV SOLUTION) ×20 IMPLANT
PACK OPEN HEART (CUSTOM PROCEDURE TRAY) ×4 IMPLANT
PAD ARMBOARD 7.5X6 YLW CONV (MISCELLANEOUS) ×16 IMPLANT
PENCIL BUTTON HOLSTER BLD 10FT (ELECTRODE) ×4 IMPLANT
PUNCH AORTIC ROTATE 4.0MM (MISCELLANEOUS) IMPLANT
PUNCH AORTIC ROTATE 4.5MM 8IN (MISCELLANEOUS) IMPLANT
PUNCH AORTIC ROTATE 5MM 8IN (MISCELLANEOUS) IMPLANT
SPONGE GAUZE 4X4 12PLY (GAUZE/BANDAGES/DRESSINGS) ×4 IMPLANT
SUT BONE WAX W31G (SUTURE) ×4 IMPLANT
SUT MNCRL AB 4-0 PS2 18 (SUTURE) ×8 IMPLANT
SUT PROLENE 3 0 SH DA (SUTURE) ×4 IMPLANT
SUT PROLENE 4 0 RB 1 (SUTURE)
SUT PROLENE 4 0 SH DA (SUTURE) IMPLANT
SUT PROLENE 4-0 RB1 .5 CRCL 36 (SUTURE) IMPLANT
SUT PROLENE 6 0 C 1 30 (SUTURE) ×16 IMPLANT
SUT PROLENE 7 0 BV 1 (SUTURE) ×4 IMPLANT
SUT PROLENE 7 0 BV1 MDA (SUTURE) ×8 IMPLANT
SUT PROLENE 8 0 BV175 6 (SUTURE) ×20 IMPLANT
SUT SILK  1 MH (SUTURE)
SUT SILK 1 MH (SUTURE) IMPLANT
SUT STEEL 6MS V (SUTURE) ×4 IMPLANT
SUT STEEL STERNAL CCS#1 18IN (SUTURE) IMPLANT
SUT STEEL SZ 6 DBL 3X14 BALL (SUTURE) ×4 IMPLANT
SUT VIC AB 1 CTX 36 (SUTURE) ×2
SUT VIC AB 1 CTX36XBRD ANBCTR (SUTURE) ×6 IMPLANT
SUT VIC AB 2-0 CT1 27 (SUTURE) ×2
SUT VIC AB 2-0 CT1 TAPERPNT 27 (SUTURE) ×6 IMPLANT
SUT VIC AB 2-0 CTX 27 (SUTURE) IMPLANT
SUT VIC AB 3-0 SH 27 (SUTURE)
SUT VIC AB 3-0 SH 27X BRD (SUTURE) IMPLANT
SUT VIC AB 3-0 X1 27 (SUTURE) IMPLANT
SUT VICRYL 4-0 PS2 18IN ABS (SUTURE) IMPLANT
SUTURE E-PAK OPEN HEART (SUTURE) ×4 IMPLANT
SYR 50ML SLIP (SYRINGE) IMPLANT
SYSTEM SAHARA CHEST DRAIN ATS (WOUND CARE) ×4 IMPLANT
TOWEL OR 17X24 6PK STRL BLUE (TOWEL DISPOSABLE) ×8 IMPLANT
TOWEL OR 17X26 10 PK STRL BLUE (TOWEL DISPOSABLE) ×8 IMPLANT
TRAY FOLEY IC TEMP SENS 14FR (CATHETERS) ×4 IMPLANT
TUBE FEEDING 8FR 16IN STR KANG (MISCELLANEOUS) ×4 IMPLANT
TUBING INSUFFLATION 10FT LAP (TUBING) ×4 IMPLANT
UNDERPAD 30X30 INCONTINENT (UNDERPADS AND DIAPERS) ×4 IMPLANT
WATER STERILE IRR 1000ML POUR (IV SOLUTION) ×4 IMPLANT

## 2012-12-31 NOTE — Anesthesia Postprocedure Evaluation (Signed)
  Anesthesia Post-op Note  [name] [name]  Procedure(s) Performed: Procedure(s) with comments: CORONARY ARTERY BYPASS GRAFTING (CABG) (N/A) - times four using left internal mammary artery, left radial artery, endoscopically harvested right saphenous vein RADIAL ARTERY HARVEST (Left)  Patient Location: SICU  Anesthesia Type:General  Level of Consciousness: Patient remains intubated per anesthesia plan  Airway and Oxygen Therapy: Patient remains intubated per anesthesia plan and Patient placed on Ventilator (see vital sign flow sheet for setting)  Post-op Pain: none  Post-op Assessment: Post-op Vital signs reviewed, Patient's Cardiovascular Status Stable, Respiratory Function Stable, Patent Airway, No signs of Nausea or vomiting and Pain level controlled  Post-op Vital Signs: stable  Complications: No apparent anesthesia complications

## 2012-12-31 NOTE — Transfer of Care (Signed)
Immediate Anesthesia Transfer of Care Note  [name] [name]  Procedure(s) Performed: Procedure(s) with comments: CORONARY ARTERY BYPASS GRAFTING (CABG) (N/A) - times four using left internal mammary artery, left radial artery, endoscopically harvested right saphenous vein RADIAL ARTERY HARVEST (Left)  Patient Location: PACU and SICU  Anesthesia Type:General  Level of Consciousness: sedated and Patient remains intubated per anesthesia plan  Airway & Oxygen Therapy: Patient placed on Ventilator (see vital sign flow sheet for setting)  Post-op Assessment: Post -op Vital signs reviewed and stable  Post vital signs: Reviewed and stable  Complications: No apparent anesthesia complications

## 2012-12-31 NOTE — Procedures (Signed)
Extubation Procedure Note  Patient Details:   Name: George [name] DOB: [birth year] MRN: [id number]   Airway Documentation:     Evaluation  O2 sats: stable throughout Complications: No apparent complications Patient did tolerate procedure well. Bilateral Breath Sounds: Clear;Diminished   Yes NIF 20 FVC 700  [name] 12/31/2012, 6:01 PM

## 2012-12-31 NOTE — Anesthesia Procedure Notes (Signed)
Procedure Name: Intubation Date/Time: 12/31/2012 8:50 AM Performed by: Sherie Don Pre-anesthesia Checklist: Patient identified, Emergency Drugs available, Suction available, Patient being monitored and Timeout performed Patient Re-evaluated:Patient Re-evaluated prior to inductionOxygen Delivery Method: Circle system utilized Preoxygenation: Pre-oxygenation with 100% oxygen Intubation Type: IV induction Ventilation: Mask ventilation without difficulty and Oral airway inserted - appropriate to patient size Laryngoscope Size: Mac and 3 (Glidescope) Grade View: Grade II Tube type: Oral Tube size: 8.0 mm Number of attempts: 1 Airway Equipment and Method: Stylet and Video-laryngoscopy Placement Confirmation: ETT inserted through vocal cords under direct vision,  positive ETCO2 and breath sounds checked- equal and bilateral Secured at: 22 cm Tube secured with: Tape Dental Injury: Teeth and Oropharynx as per pre-operative assessment

## 2012-12-31 NOTE — Progress Notes (Signed)
ANTICOAGULATION CONSULT NOTE - Follow Up Consult  Pharmacy Consult for heparin Indication: chest pain/ACS   Labs:  Recent Labs  12/29/12 1030  12/29/12 1735 12/29/12 1810 12/30/12 0031 12/30/12 0500 12/30/12 1936 12/31/12 0440  HGB 16.2  --   --   --  15.0 14.9  --  14.0  HCT 45.6  --   --   --  41.6 42.8  --  40.4  PLT 198  --   --   --  157 168  --  154  APTT  --   --   --   --   --   --  62*  --   LABPROT  --   --   --  14.4  --   --   --   --   INR  --   --   --  1.14  --   --   --   --   HEPARINUNFRC  --   --   --   --  0.26*  --  0.22* 0.49  CREATININE 0.89  --   --   --   --  0.88  --   --   TROPONINI <0.30  < > <0.30  --  <0.30 <0.30  --   --   < > = values in this interval not displayed.   Assessment/Plan:  52yo male now therapeutic on heparin after rate increases.  Will continue gtt at current rate and confirm stable with additional level.  Colleen Can PharmD BCPS 12/31/2012,5:26 AM

## 2012-12-31 NOTE — Progress Notes (Signed)
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

## 2012-12-31 NOTE — Anesthesia Preprocedure Evaluation (Addendum)
Anesthesia Evaluation  Patient identified by MRN, date of birth, ID band Patient awake    Reviewed: Allergy & Precautions, H&P , NPO status , Patient's Chart, lab work & pertinent test results, reviewed documented beta blocker date and time   Airway Mallampati: II TM Distance: >3 FB Neck ROM: Full    Dental  (+) Dental Advisory Given, Poor Dentition and Missing   Pulmonary          Cardiovascular hypertension, Pt. on home beta blockers + CAD and + Past MI Rhythm:Regular Rate:Normal     Neuro/Psych PSYCHIATRIC DISORDERS Anxiety    GI/Hepatic   Endo/Other  diabetes, Type 1, Insulin Dependent  Renal/GU      Musculoskeletal   Abdominal (+) + obese,   Peds  Hematology   Anesthesia Other Findings   Reproductive/Obstetrics                         Anesthesia Physical Anesthesia Plan  ASA: III  Anesthesia Plan: General   Post-op Pain Management:    Induction: Intravenous  Airway Management Planned: Oral ETT  Additional Equipment: Arterial line and PA Cath  Intra-op Plan:   Post-operative Plan: Post-operative intubation/ventilation  Informed Consent: I have reviewed the patients History and Physical, chart, labs and discussed the procedure including the risks, benefits and alternatives for the proposed anesthesia with the patient or authorized representative who has indicated his/her understanding and acceptance.   Dental advisory given  Plan Discussed with: CRNA and Surgeon  Anesthesia Plan Comments:        Anesthesia Quick Evaluation

## 2012-12-31 NOTE — Progress Notes (Signed)
Brief Nutrition Note:  RD consulted for DM diet education. Documented noncompliant with medication, ignored dx of DM in the past . Currently pt out of room for CABG procedure. Not appropriate for diet education at this time. RD will f/u with education when pt is more appropriate.    Clarene Duke RD, LDN Pager [phone] After Hours pager [phone]

## 2012-12-31 NOTE — H&P (Signed)
Reason for Consult:3 vessel CAD with unstable angina  Referring Physician: Dr. Henri Medal George [name] is an 52 y.o. male.  HPI: 52 yo WM with history of CAD dating back to 2005 when he had an MI. He did have a stent placed at that time. He has not been reliable in terms of follow up. He was diagnosed with diabetes about a year ago but has been non-compliant with medications. He does have a history of HTN and dyslipidemia. He is a former smoker, having quit in 2005 after his MI. He also has a very strong family history. He says he has been having CP for the past year. It has become more frequent and severe over the past month. On the morning of admission he was working on his taxes when he developed severe left sided CP. He went to the [facility] ED and was transferred to [facility] for further management. He r/o for MI. Today he underwent cardiac catheterization where he was found to have severe 3 vessel CAD. LV EF was 55-65%. He currently is pain free.  Past Medical History   Diagnosis  Date   .  Hyperlipidemia    .  Coronary artery disease    .  Hypertriglyceridemia    .  Dyslipidemia    .  Tobacco abuse    .  AMI (acute myocardial infarction)  2005   .  Diabetes mellitus without complication     Past Surgical History   Procedure  Laterality  Date   .  Lumbar spine surgery   2005   .  Coronary stent placement   2005     LAD50/70, CFX OK, PL1 90, PL2 40, RCA 95->0 with 2.5 x 60 mm Taxus stent, EF 60   .  Angioplasty     .  Coronary angioplasty      History reviewed. No pertinent family history.  Social History: reports that he has never smoked. He does not have any smokeless tobacco history on file. He reports that he uses illicit drugs (Marijuana). He reports that he does not drink alcohol.  Allergies: No Known Allergies  Medications:  Prior to Admission:  Prescriptions prior to admission   Medication  Sig  Dispense  Refill   .  aspirin 325 MG tablet  Take 325 mg by mouth daily.     .   diazepam (VALIUM) 10 MG tablet  Take 10 mg by mouth every 6 (six) hours as needed for anxiety.     [name]  lisinopril (PRINIVIL,ZESTRIL) 20 MG tablet  Take 20 mg by mouth daily.     .  metoprolol tartrate (LOPRESSOR) 25 MG tablet  Take 25 mg by mouth 2 (two) times daily.     .  nitroGLYCERIN (NITROSTAT) 0.4 MG SL tablet  Place 0.4 mg under the tongue daily.     .  Omega-3 Fatty Acids (FISH OIL) 500 MG CAPS  Take 1 capsule by mouth 3 (three) times daily.     [name]  omeprazole (PRILOSEC) 40 MG capsule  Take 40 mg by mouth daily.     [name]  oxyCODONE-acetaminophen (PERCOCET) 10-325 MG per tablet  Take 1 tablet by mouth every 4 (four) hours as needed for pain.     .  simvastatin (ZOCOR) 40 MG tablet  Take 40 mg by mouth at bedtime.      Results for orders placed during the hospital encounter of 12/29/12 (from the past 48 hour(s))   CBC WITH DIFFERENTIAL Status:  None    Collection Time    12/29/12 10:30 AM   Result  Value  Range    WBC  8.6  4.0 - 10.5 K/uL    RBC  5.08  4.22 - 5.81 MIL/uL    Hemoglobin  16.2  13.0 - 17.0 g/dL    HCT  40.9  81.1 - 91.4 %    MCV  89.8  78.0 - 100.0 fL    MCH  31.9  26.0 - 34.0 pg    MCHC  35.5  30.0 - 36.0 g/dL    RDW  78.2  95.6 - 21.3 %    Platelets  198  150 - 400 K/uL    Neutrophils Relative  48  43 - 77 %    Neutro Abs  4.1  1.7 - 7.7 K/uL    Lymphocytes Relative  42  12 - 46 %    Lymphs Abs  3.6  0.7 - 4.0 K/uL    Monocytes Relative  6  3 - 12 %    Monocytes Absolute  0.5  0.1 - 1.0 K/uL    Eosinophils Relative  4  0 - 5 %    Eosinophils Absolute  0.4  0.0 - 0.7 K/uL    Basophils Relative  1  0 - 1 %    Basophils Absolute  0.0  0.0 - 0.1 K/uL   COMPREHENSIVE METABOLIC PANEL Status: Abnormal    Collection Time    12/29/12 10:30 AM   Result  Value  Range    Sodium  135  135 - 145 mEq/L    Comment:  POST-ULTRACENTRIFUGATION    Potassium  4.4  3.5 - 5.1 mEq/L    Chloride  97  96 - 112 mEq/L    CO2  22  19 - 32 mEq/L    Glucose, Bld  498 (*)  70 - 99 mg/dL     BUN  12  6 - 23 mg/dL    Creatinine, Ser  0.86  0.50 - 1.35 mg/dL    Calcium  9.4  8.4 - 10.5 mg/dL    Total Protein  7.6  6.0 - 8.3 g/dL    Albumin  4.0  3.5 - 5.2 g/dL    AST  32  0 - 37 U/L    ALT  67 (*)  0 - 53 U/L    Alkaline Phosphatase  101  39 - 117 U/L    Total Bilirubin  0.2 (*)  0.3 - 1.2 mg/dL    GFR calc non Af Amer  >90  >90 mL/min    GFR calc Af Amer  >90  >90 mL/min    Comment:      The eGFR has been calculated     using the CKD EPI equation.     This calculation has not been     validated in all clinical     situations.     eGFR's persistently     <90 mL/min signify     possible Chronic Kidney Disease.   TROPONIN I Status: None    Collection Time    12/29/12 10:30 AM   Result  Value  Range    Troponin I  <0.30  <0.30 ng/mL    Comment:      Due to the release kinetics of cTnI,     a negative result within the first hours     of the onset of symptoms does not rule out  myocardial infarction with certainty.     If myocardial infarction is still suspected,     repeat the test at appropriate intervals.   TROPONIN I Status: None    Collection Time    12/29/12 2:45 PM   Result  Value  Range    Troponin I  <0.30  <0.30 ng/mL    Comment:      Due to the release kinetics of cTnI,     a negative result within the first hours     of the onset of symptoms does not rule out     myocardial infarction with certainty.     If myocardial infarction is still suspected,     repeat the test at appropriate intervals.   GLUCOSE, CAPILLARY Status: Abnormal    Collection Time    12/29/12 4:56 PM   Result  Value  Range    Glucose-Capillary  288 (*)  70 - 99 mg/dL   TROPONIN I Status: None    Collection Time    12/29/12 5:35 PM   Result  Value  Range    Troponin I  <0.30  <0.30 ng/mL    Comment:      Due to the release kinetics of cTnI,     a negative result within the first hours     of the onset of symptoms does not rule out     myocardial infarction with  certainty.     If myocardial infarction is still suspected,     repeat the test at appropriate intervals.   PROTIME-INR Status: None    Collection Time    12/29/12 6:10 PM   Result  Value  Range    Prothrombin Time  14.4  11.6 - 15.2 seconds    INR  1.14  0.00 - 1.49   GLUCOSE, CAPILLARY Status: Abnormal    Collection Time    12/29/12 9:53 PM   Result  Value  Range    Glucose-Capillary  325 (*)  70 - 99 mg/dL    Comment 1  Notify RN    TROPONIN I Status: None    Collection Time    12/30/12 12:31 AM   Result  Value  Range    Troponin I  <0.30  <0.30 ng/mL    Comment:      Due to the release kinetics of cTnI,     a negative result within the first hours     of the onset of symptoms does not rule out     myocardial infarction with certainty.     If myocardial infarction is still suspected,     repeat the test at appropriate intervals.   HEPARIN LEVEL (UNFRACTIONATED) Status: Abnormal    Collection Time    12/30/12 12:31 AM   Result  Value  Range    Heparin Unfractionated  0.26 (*)  0.30 - 0.70 IU/mL    Comment:      IF HEPARIN RESULTS ARE BELOW     EXPECTED VALUES, AND PATIENT     DOSAGE HAS BEEN CONFIRMED,     SUGGEST FOLLOW UP TESTING     OF ANTITHROMBIN III LEVELS.   CBC Status: Abnormal    Collection Time    12/30/12 12:31 AM   Result  Value  Range    WBC  7.5  4.0 - 10.5 K/uL    RBC  4.66  4.22 - 5.81 MIL/uL    Hemoglobin  15.0  13.0 - 17.0 g/dL  HCT  41.6  39.0 - 52.0 %    MCV  89.3  78.0 - 100.0 fL    MCH  32.2  26.0 - 34.0 pg    MCHC  36.1 (*)  30.0 - 36.0 g/dL    RDW  16.1  09.6 - 04.5 %    Platelets  157  150 - 400 K/uL   HEMOGLOBIN A1C Status: Abnormal    Collection Time    12/30/12 5:00 AM   Result  Value  Range    Hemoglobin A1C  12.0 (*)  <5.7 %    Comment:  (NOTE)         According to the ADA Clinical Practice Recommendations for 2011, when     HbA1c is used as a screening test:     >=6.5% Diagnostic of Diabetes Mellitus     (if abnormal  result is confirmed)     5.7-6.4% Increased risk of developing Diabetes Mellitus     References:Diagnosis and Classification of Diabetes Mellitus,Diabetes     Care,2011,34(Suppl 1):S62-S69 and Standards of Medical Care in     Diabetes - 2011,Diabetes Care,2011,34 (Suppl 1):S11-S61.    Mean Plasma Glucose  298 (*)  <117 mg/dL   TROPONIN I Status: None    Collection Time    12/30/12 5:00 AM   Result  Value  Range    Troponin I  <0.30  <0.30 ng/mL    Comment:      Due to the release kinetics of cTnI,     a negative result within the first hours     of the onset of symptoms does not rule out     myocardial infarction with certainty.     If myocardial infarction is still suspected,     repeat the test at appropriate intervals.   CBC Status: None    Collection Time    12/30/12 5:00 AM   Result  Value  Range    WBC  8.1  4.0 - 10.5 K/uL    RBC  4.75  4.22 - 5.81 MIL/uL    Hemoglobin  14.9  13.0 - 17.0 g/dL    HCT  40.9  81.1 - 91.4 %    MCV  90.1  78.0 - 100.0 fL    MCH  31.4  26.0 - 34.0 pg    MCHC  34.8  30.0 - 36.0 g/dL    RDW  78.2  95.6 - 21.3 %    Platelets  168  150 - 400 K/uL   BASIC METABOLIC PANEL Status: Abnormal    Collection Time    12/30/12 5:00 AM   Result  Value  Range    Sodium  137  135 - 145 mEq/L    Potassium  4.1  3.5 - 5.1 mEq/L    Chloride  97  96 - 112 mEq/L    CO2  28  19 - 32 mEq/L    Glucose, Bld  227 (*)  70 - 99 mg/dL    BUN  12  6 - 23 mg/dL    Creatinine, Ser  0.86  0.50 - 1.35 mg/dL    Calcium  9.1  8.4 - 10.5 mg/dL    GFR calc non Af Amer  >90  >90 mL/min    GFR calc Af Amer  >90  >90 mL/min    Comment:      The eGFR has been calculated     using the CKD EPI equation.  This calculation has not been     validated in all clinical     situations.     eGFR's persistently     <90 mL/min signify     possible Chronic Kidney Disease.   LIPID PANEL Status: Abnormal    Collection Time    12/30/12 5:00 AM   Result  Value  Range     Cholesterol  186  0 - 200 mg/dL    Triglycerides  161 (*)  <150 mg/dL    HDL  34 (*)  >09 mg/dL    Total CHOL/HDL Ratio  5.5     VLDL  UNABLE TO CALCULATE IF TRIGLYCERIDE OVER 400 mg/dL  0 - 40 mg/dL    LDL Cholesterol  UNABLE TO CALCULATE IF TRIGLYCERIDE OVER 400 mg/dL  0 - 99 mg/dL    Comment:      Total Cholesterol/HDL:CHD Risk     Coronary Heart Disease Risk Table     Men Women     1/2 Average Risk 3.4 3.3     Average Risk 5.0 4.4     2 X Average Risk 9.6 7.1     3 X Average Risk 23.4 11.0         Use the calculated Patient Ratio     above and the CHD Risk Table     to determine the patient's CHD Risk.         ATP III CLASSIFICATION (LDL):     <100 mg/dL Optimal     604-540 mg/dL Near or Above     Optimal     130-159 mg/dL Borderline     981-191 mg/dL High     >478 mg/dL Very High   GLUCOSE, CAPILLARY Status: Abnormal    Collection Time    12/30/12 8:36 AM   Result  Value  Range    Glucose-Capillary  244 (*)  70 - 99 mg/dL   MRSA PCR SCREENING Status: None    Collection Time    12/30/12 10:03 AM   Result  Value  Range    MRSA by PCR  NEGATIVE  NEGATIVE    Comment:      The GeneXpert MRSA Assay (FDA     approved for NASAL specimens     only), is one component of a     comprehensive MRSA colonization     surveillance program. It is not     intended to diagnose MRSA     infection nor to guide or     monitor treatment for     MRSA infections.   GLUCOSE, CAPILLARY Status: Abnormal    Collection Time    12/30/12 11:51 AM   Result  Value  Range    Glucose-Capillary  234 (*)  70 - 99 mg/dL   GLUCOSE, CAPILLARY Status: Abnormal    Collection Time    12/30/12 4:37 PM   Result  Value  Range    Glucose-Capillary  309 (*)  70 - 99 mg/dL    Dg Chest Portable 1 View  12/29/2012 *RADIOLOGY REPORT* Clinical Data: Chest pain PORTABLE CHEST - 1 VIEW Comparison: 06/12/2004 Findings: Artifact overlies chest. Heart size is normal. Mediastinal shadows are normal. The vascularity  is normal. Lungs are clear. No effusions. No significant bony finding. IMPRESSION: No active disease Original Report Authenticated By: Paulina Fusi, M.D.   Review of Systems  Constitutional: Positive for malaise/fatigue.  Respiratory: Negative for shortness of breath and wheezing.  Cardiovascular: Positive for chest pain.  Genitourinary: Positive for frequency.  Musculoskeletal: Positive for myalgias and back pain.  Walks with cane, chronic right leg weakness  Neurological: Positive for focal weakness (right leg).  All other systems reviewed and are negative.   Blood pressure 129/70, pulse 101, temperature 98.4 F (36.9 C), temperature source Oral, resp. rate 23, height 5\' 10"  (1.778 m), weight 245 lb 12.8 oz (111.494 kg), SpO2 99.00%.  Physical Exam  Vitals reviewed.  Constitutional: He is oriented to person, place, and time. He appears well-developed and well-nourished. No distress.  HENT:  Head: Normocephalic and atraumatic.  Eyes: EOM are normal. Pupils are equal, round, and reactive to light.  Neck: Neck supple. No thyromegaly present.  No carotid bruits  Cardiovascular: Normal rate, regular rhythm, normal heart sounds and intact distal pulses. Exam reveals no gallop and no friction rub.  No murmur heard. Normal Allen's test on left  Respiratory: Effort normal and breath sounds normal. He has no wheezes. He has no rales.  GI: Soft. There is no tenderness.  Musculoskeletal: He exhibits no edema.  Lymphadenopathy:  He has no cervical adenopathy.  Neurological: He is alert and oriented to person, place, and time. No cranial nerve deficit.  Skin: Skin is warm and dry.   CARDIAC CATHETERIZATION  Procedural Findings:  Hemodynamics:  AO 130 78  LV 130 8  Coronary angiography:  Coronary dominance: right  Left mainstem: 30%  Left anterior descending (LAD): 70% multiple discrete proximal and mid vessel  D1: 40% ostial  D2: small vessel  D3: small vessel  Left circumflex (LCx):  95% proximal disease  OM1: 40%  OM2: /AV groove 90%  Right coronary artery (RCA): Long area of 99% mid vessel disease with TIMI 2 flow  Left ventriculography: Left ventricular systolic function is normal, LVEF is estimated at 55-65%, there is no significant mitral regurgitation  Final Conclusions: Severe 3VD  Recommendations: CABG Dr Dorris Fetch called transfer to unit start heparin    Assessment/Plan:  52 yo WM with multiple CRF and a history of CAD dating back to age 32, who presents with an unstable coronary syndrome. At cath he has severe 3 vessel CAD not amenable to PCI. CABG is indicated for survival benefit as well as relief of symptoms.  I have discussed with him the general nature of the procedure, need for general anesthesia, and incisions to be used. We discussed the expected hospital stay, overall recovery and short and long term outcomes. He understands the risks include, but are not limited to, death, stroke, MI, DVT/PE, bleeding, possible need for transfusion, infections, cardiac arrhythmias, and other organ system dysfunction including respiratory, renal, or GI complications. We discussed the possible use of the left radial artery and the potential for ischemic or other complications from harvest of that vessel. He understands and accepts these risks and agrees to proceed.  For OR in AM 2/19  Kambrey Hagger C  12/30/2012, 5:15 PM

## 2012-12-31 NOTE — Interval H&P Note (Signed)
History and Physical Interval Note:  No change in pulse ox tracing with left radial occlusion  12/31/2012 8:10 AM  George [name]  has presented today for surgery, with the diagnosis of CAD  The various methods of treatment have been discussed with the patient and family. After consideration of risks, benefits and other options for treatment, the patient has consented to  Procedure(s): CORONARY ARTERY BYPASS GRAFTING (CABG) (N/A) RADIAL ARTERY HARVEST (Left) as a surgical intervention .  The patient's history has been reviewed, patient examined, no change in status, stable for surgery.  I have reviewed the patient's chart and labs.  Questions were answered to the patient's satisfaction.     [name] C

## 2012-12-31 NOTE — Preoperative (Signed)
Beta Blockers   Reason not to administer Beta Blockers: beta blocker this am

## 2012-12-31 NOTE — Brief Op Note (Addendum)
12/29/2012 - 12/31/2012  12:09 PM  PATIENT:  George [name]  52 y.o. male  PRE-OPERATIVE DIAGNOSIS:  Coronary artery disease  POST-OPERATIVE DIAGNOSIS:  Coronary artery disease  PROCEDURE:  Procedure(s): CORONARY ARTERY BYPASS GRAFTING x 4 -LIMA to LAD -SEQUENTIAL SVG to OM1, OM2 -RADIAL ARTERY to PDA  RADIAL ARTERY HARVEST (Left)  ENDOSCOPIC SAPHENOUS VEIN HARVEST RIGHT THIGH  SURGEON:  Surgeon(s) and Role: Panel 1:    * Loreli Slot, MD - Primary  Panel 2:    * Loreli Slot, MD - Primary  PHYSICIAN ASSISTANT: Lowella Dandy PA-C, Coral Ceo PA-C  ANESTHESIA:   general  EBL:  Total I/O In: 250 [I.V.:250] Out: 450 [Urine:450]  BLOOD ADMINISTERED: CELLSAVER  DRAINS: Left Pleural chest tubes, mediastinal chest drains   LOCAL MEDICATIONS USED:  NONE  SPECIMEN:  No Specimen  DISPOSITION OF SPECIMEN:  N/A  COUNTS:  YES  PLAN OF CARE: Admit to inpatient   PATIENT DISPOSITION:  ICU - intubated and hemodynamically stable.   Delay start of Pharmacological VTE agent (>24hrs) due to surgical blood loss or risk of bleeding: yes  XC= 81 min CPB= 120 min  PDA fair target, other targets good Good conduits  Dictation # [id number]

## 2013-01-01 ENCOUNTER — Inpatient Hospital Stay (HOSPITAL_COMMUNITY): Payer: Medicaid Other

## 2013-01-01 LAB — POCT I-STAT, CHEM 8
Calcium, Ion: 1.17 mmol/L (ref 1.12–1.23)
Creatinine, Ser: 1 mg/dL (ref 0.50–1.35)
Glucose, Bld: 177 mg/dL — ABNORMAL HIGH (ref 70–99)
HCT: 36 % — ABNORMAL LOW (ref 39.0–52.0)
Hemoglobin: 12.2 g/dL — ABNORMAL LOW (ref 13.0–17.0)

## 2013-01-01 LAB — CBC
Hemoglobin: 12 g/dL — ABNORMAL LOW (ref 13.0–17.0)
MCH: 31.6 pg (ref 26.0–34.0)
MCH: 30.8 pg (ref 26.0–34.0)
MCHC: 34.5 g/dL (ref 30.0–36.0)
MCHC: 33.7 g/dL (ref 30.0–36.0)
Platelets: 142 10*3/uL — ABNORMAL LOW (ref 150–400)
RDW: 13.7 % (ref 11.5–15.5)

## 2013-01-01 LAB — GLUCOSE, CAPILLARY
Glucose-Capillary: 141 mg/dL — ABNORMAL HIGH (ref 70–99)
Glucose-Capillary: 109 mg/dL — ABNORMAL HIGH (ref 70–99)
Glucose-Capillary: 177 mg/dL — ABNORMAL HIGH (ref 70–99)
Glucose-Capillary: 182 mg/dL — ABNORMAL HIGH (ref 70–99)
Glucose-Capillary: 136 mg/dL — ABNORMAL HIGH (ref 70–99)
Glucose-Capillary: 93 mg/dL (ref 70–99)
Glucose-Capillary: 167 mg/dL — ABNORMAL HIGH (ref 70–99)

## 2013-01-01 LAB — BASIC METABOLIC PANEL
Calcium: 8.5 mg/dL (ref 8.4–10.5)
GFR calc Af Amer: >90 mL/min (ref >90)
GFR calc non Af Amer: >90 mL/min (ref >90)
Potassium: 4.7 mEq/L (ref 3.5–5.1)
Sodium: 139 mEq/L (ref 135–145)

## 2013-01-01 LAB — CREATININE, SERUM: Creatinine, Ser: 0.98 mg/dL (ref 0.50–1.35)

## 2013-01-01 LAB — MAGNESIUM: Magnesium: 2.4 mg/dL (ref 1.5–2.5)

## 2013-01-01 MED ORDER — PANTOPRAZOLE SODIUM 40 MG PO TBEC
40.0000 mg | DELAYED_RELEASE_TABLET | Freq: Every day | ORAL | Status: DC
  Administered 2013-01-01 – 2013-01-04 (×4): 40 mg via ORAL
  Filled 2013-01-01 (×4): qty 1

## 2013-01-01 MED ORDER — INSULIN ASPART 100 UNIT/ML ~~LOC~~ SOLN
0.0000 [IU] | SUBCUTANEOUS | Status: DC
  Administered 2013-01-01: 2 [IU] via SUBCUTANEOUS
  Administered 2013-01-01: 12 [IU] via SUBCUTANEOUS
  Administered 2013-01-02: via SUBCUTANEOUS
  Administered 2013-01-02: 8 [IU] via SUBCUTANEOUS
  Administered 2013-01-02: 4 [IU] via SUBCUTANEOUS

## 2013-01-01 MED ORDER — INSULIN DETEMIR 100 UNIT/ML ~~LOC~~ SOLN
20.0000 [IU] | Freq: Two times a day (BID) | SUBCUTANEOUS | Status: DC
  Administered 2013-01-01 (×2): 20 [IU] via SUBCUTANEOUS

## 2013-01-01 MED ORDER — ISOSORBIDE MONONITRATE 15 MG HALF TABLET
15.0000 mg | ORAL_TABLET | Freq: Every day | ORAL | Status: DC
  Administered 2013-01-01: 15 mg via ORAL
  Filled 2013-01-01 (×3): qty 1

## 2013-01-01 MED ORDER — FUROSEMIDE 10 MG/ML IJ SOLN
40.0000 mg | Freq: Once | INTRAMUSCULAR | Status: AC
  Administered 2013-01-01: 40 mg via INTRAVENOUS

## 2013-01-01 MED ORDER — DIAZEPAM 5 MG PO TABS
5.0000 mg | ORAL_TABLET | Freq: Four times a day (QID) | ORAL | Status: DC | PRN
  Administered 2013-01-01 – 2013-01-02 (×3): 5 mg via ORAL
  Filled 2013-01-01 (×3): qty 1

## 2013-01-01 MED ORDER — INSULIN DETEMIR 100 UNIT/ML ~~LOC~~ SOLN: 20.0000 [IU] | Freq: Every day | SUBCUTANEOUS | Status: DC

## 2013-01-01 MED FILL — Sodium Bicarbonate IV Soln 8.4%: INTRAVENOUS | Qty: 50 | Status: AC

## 2013-01-01 MED FILL — Potassium Chloride Inj 2 mEq/ML: INTRAVENOUS | Qty: 40 | Status: AC

## 2013-01-01 MED FILL — Sodium Chloride Irrigation Soln 0.9%: Qty: 3000 | Status: AC

## 2013-01-01 MED FILL — Electrolyte-R (PH 7.4) Solution: INTRAVENOUS | Qty: 4000 | Status: AC

## 2013-01-01 MED FILL — Sodium Chloride IV Soln 0.9%: INTRAVENOUS | Qty: 1000 | Status: AC

## 2013-01-01 MED FILL — Mannitol IV Soln 20%: INTRAVENOUS | Qty: 500 | Status: AC

## 2013-01-01 MED FILL — Heparin Sodium (Porcine) Inj 1000 Unit/ML: INTRAMUSCULAR | Qty: 20 | Status: AC

## 2013-01-01 MED FILL — Lidocaine HCl IV Inj 20 MG/ML: INTRAVENOUS | Qty: 5 | Status: AC

## 2013-01-01 MED FILL — Heparin Sodium (Porcine) Inj 1000 Unit/ML: INTRAMUSCULAR | Qty: 30 | Status: AC

## 2013-01-01 MED FILL — Magnesium Sulfate Inj 50%: INTRAMUSCULAR | Qty: 10 | Status: AC

## 2013-01-01 NOTE — Progress Notes (Signed)
Patient has been c/o of pain every 1-2 hours, he states it rates anywhere from 8-10.  Pain medication has been given to patient when it was due and as the patient needed it.  Patient was repositioned, giving ice chips, and encouraged.  Will continue to keep patient as comfortable as possible. VS WNL. Patient is neuro intact.

## 2013-01-01 NOTE — Progress Notes (Addendum)
[facility] DAILY PROGRESS NOTE                   [address]            [place] [address]          [phone]      1 Day Post-Op Procedure(s) (LRB): CORONARY ARTERY BYPASS GRAFTING (CABG) (N/A) RADIAL ARTERY HARVEST (Left)  Total Length of Stay:  LOS: 3 days   Subjective: Awake and alert.  Had a lot of pain overnight, feels that pain meds are not effective.  Has a history of chronic back/pain issues. Overall, doing well, however.   Objective: Vital signs in last 24 hours: Temp:  [97.3 F (36.3 C)-100 F (37.8 C)] 99.3 F (37.4 C) (02/20 0715) Pulse Rate:  [82-105] 92 (02/20 0715) Cardiac Rhythm:  [-] Normal sinus rhythm (02/19 2000) Resp:  [11-25] 11 (02/20 0715) BP: (92-156)/(42-91) 134/72 mmHg (02/20 0715) SpO2:  [90 %-100 %] 93 % (02/20 0715) Arterial Line BP: (74-123)/(53-87) 90/84 mmHg (02/20 0515) FiO2 (%):  [40 %-50 %] 40 % (02/19 1700)  Filed Weights   12/29/12 1654 12/30/12 0553 12/31/12 0450  Weight: 250 lb (113.399 kg) 245 lb 12.8 oz (111.494 kg) 246 lb 14.6 oz (112 kg)    Weight change:    Hemodynamic parameters for last 24 hours: PAP: (30-49)/(17-31) 45/25 mmHg CO:  [4.6 L/min-5.5 L/min] 5.5 L/min CI:  [2 L/min/m2-2.4 L/min/m2] 2.4 L/min/m2  Intake/Output from previous day: 02/19 0701 - 02/20 0700 In: 6273.2 [I.V.:3988.2; Blood:885; IV Piggyback:1400] Out: 5575 [Urine:3340; Blood:1625; Chest Tube:610]  CBGs: 160-156-142-141-109-182-153-130-99-108-102-104    Current Meds: Scheduled Meds: . acetaminophen  1,000 mg Oral Q6H   Or  . acetaminophen (TYLENOL) oral liquid 160 mg/5 mL  975 mg Per Tube Q6H  . aspirin EC  325 mg Oral Daily   Or  . aspirin  324 mg Per Tube Daily  . bisacodyl  10 mg Oral Daily   Or  . bisacodyl  10 mg Rectal Daily  . cefUROXime (ZINACEF)  IV  1.5 g Intravenous Q12H  . docusate sodium  200 mg Oral Daily  . famotidine (PEPCID) IV  20 mg Intravenous Q12H  . insulin regular  0-10 Units Intravenous TID WC    . ketorolac  15 mg Intravenous Q6H  . metoCLOPramide (REGLAN) injection  10 mg Intravenous Q6H  . metoprolol tartrate  12.5 mg Oral BID   Or  . metoprolol tartrate  12.5 mg Per Tube BID  . omega-3 acid ethyl esters  1 g Oral BID  . [START ON 01/02/2013] pantoprazole  40 mg Oral Daily  . simvastatin  40 mg Oral QHS  . sodium chloride  3 mL Intravenous Q12H   Continuous Infusions: . sodium chloride Stopped (12/31/12 1400)  . sodium chloride Stopped (12/31/12 1400)  . dexmedetomidine Stopped (12/31/12 1600)  . insulin (NOVOLIN-R) infusion 5.7 Units/hr (01/01/13 0524)  . lactated ringers 60 mL/hr at 01/01/13 0700  . nitroGLYCERIN 10 mcg/min (01/01/13 0700)  . phenylephrine (NEO-SYNEPHRINE) Adult infusion Stopped (12/31/12 2300)   PRN Meds:.sodium chloride, albumin human, ALPRAZolam, metoprolol, midazolam, morphine injection, ondansetron (ZOFRAN) IV, oxyCODONE, sodium chloride   Physical Exam: General appearance: alert, cooperative and no distress Heart: regular rate and rhythm, + soft rub Lungs: clear to auscultation bilaterally Extremities: No LE edema, L hand/arm well perfused Wound: Dressed and dry    Lab Results: CBC: Recent Labs  12/31/12 2030 12/31/12 2048 01/01/13 0350  WBC 18.1*  --  17.3*  HGB 13.2 12.9* 13.1  HCT 38.2* 38.0* 38.0*  PLT 153  --  142*   BMET:  Recent Labs  12/31/12 0440  12/31/12 2048 01/01/13 0350  NA 137  < > 139 139  K 3.8  < > 5.0 4.7  CL 102  --  108 106  CO2 24  --   --  25  GLUCOSE 217*  < > 188* 120*  BUN 10  --  8 10  CREATININE 0.77  < > 0.90 0.98  CALCIUM 8.9  --   --  8.5  < > = values in this interval not displayed.  PT/INR:  Recent Labs  12/31/12 1400  LABPROT 16.2*  INR 1.33   Radiology: Dg Chest 2 View  12/30/2012  *RADIOLOGY REPORT*  Clinical Data: Preop.  CABG.  CHEST - 2 VIEW  Comparison: 12/29/2012  Findings: Normal heart size.  Lungs are under aerated and grossly clear.  Normal vascularity.  No pneumothorax.   No pleural effusion. No acute bony deformity.  IMPRESSION: No active cardiopulmonary disease.   Original Report Authenticated By: Jolaine Click, M.D.    Dg Chest Portable 1 View  12/31/2012  *RADIOLOGY REPORT*  Clinical Data: Postop CABG.  PORTABLE CHEST - 1 VIEW  Comparison: 12/30/2012  Findings: Endotracheal tube is approximately 4 cm above the carina. Swan-Ganz catheter tip is in the central right pulmonary artery. Left chest tube in place without pneumothorax.  NG tube enters stomach.  Changes of CABG.  Mild cardiomegaly and bibasilar atelectasis.  IMPRESSION: Postoperative changes as above.  No pneumothorax.  Bibasilar atelectasis.   Original Report Authenticated By: Charlett Nose, M.D.      Assessment/Plan: S/P Procedure(s) (LRB): CORONARY ARTERY BYPASS GRAFTING (CABG) (N/A) RADIAL ARTERY HARVEST (Left)  CV- BPs trending up, could be related to pain.  Will start beta blocker and titrate up as able.  Imdur for RA harvest.  Vol overload- diurese.  Pain control is main issue this am.  He takes large doses of Valium at home as well as po narcotics, so we will restart these and titrate as tolerated. Scheduled Toradol started overnight.   DM- not on meds at home. A1C=12.  Will transition off insulin gtt this am and start Lantus.  Will eventually need po meds for home.  Routine postop day 1 progression, d/c lines and tubes, mobilize.      George [name] 01/01/2013 7:39 AM patient examined and medical record reviewed,agree with above note Levemir ordered to transition off IV insulin. VAN TRIGT III,PETER 01/01/2013

## 2013-01-01 NOTE — Progress Notes (Signed)
Inpatient Diabetes Program Recommendations  AACE/ADA: New Consensus Statement on Inpatient Glycemic Control (2013)  Target Ranges:  Prepandial:   less than 140 mg/dL      Peak postprandial:   less than 180 mg/dL (1-2 hours)      Critically ill patients:  140 - 180 mg/dL    Spoke with patient yesterday regarding his diabetes (see note from 12/31/12).  Patient POD#1 CABG.  RD to see patient to provide diabetes diet education.  Will ask RNs caring for patient to continue further diabetes education at the bedside.  Living Well with DM booklet has already been ordered for this patient.  Gave patient information on free diabetes classes that are offered at [facility].  Patient appreciative of information.  Noted patient transitioned off IV insulin today to Levemir and Novolog.  Spoke with Coral Ceo, PA for [facility].  Plan for right now is to send patient home on oral diabetes medications with follow-up with his PCP Dr. Regino Schultze in [place].   Note: Will follow. Ambrose Finland RN, MSN, CDE Diabetes Coordinator Inpatient Diabetes Program [phone]

## 2013-01-01 NOTE — Progress Notes (Signed)
[facility] BRIEF SICU PROGRESS NOTE  1 Day Post-Op  S/P Procedure(s) (LRB): CORONARY ARTERY BYPASS GRAFTING (CABG) (N/A) RADIAL ARTERY HARVEST (Left)   Stable day NSR w/ BP stable O2 sats 92% on 2 L/min UOP adequate Labs okay  Plan: Continue routine care  Johnny Gorter H 01/01/2013 8:12 PM

## 2013-01-02 ENCOUNTER — Encounter (HOSPITAL_COMMUNITY): Payer: Self-pay | Admitting: Thoracic Surgery (Cardiothoracic Vascular Surgery)

## 2013-01-02 ENCOUNTER — Inpatient Hospital Stay (HOSPITAL_COMMUNITY): Payer: Medicaid Other

## 2013-01-02 LAB — GLUCOSE, CAPILLARY
Glucose-Capillary: 204 mg/dL — ABNORMAL HIGH (ref 70–99)
Glucose-Capillary: 161 mg/dL — ABNORMAL HIGH (ref 70–99)
Glucose-Capillary: 214 mg/dL — ABNORMAL HIGH (ref 70–99)

## 2013-01-02 LAB — BASIC METABOLIC PANEL
BUN: 17 mg/dL (ref 6–23)
CO2: 26 mEq/L (ref 19–32)
Chloride: 101 mEq/L (ref 96–112)
Creatinine, Ser: 1 mg/dL (ref 0.50–1.35)
Glucose, Bld: 225 mg/dL — ABNORMAL HIGH (ref 70–99)
Potassium: 4.5 mEq/L (ref 3.5–5.1)

## 2013-01-02 LAB — CBC
HCT: 34.6 % — ABNORMAL LOW (ref 39.0–52.0)
Hemoglobin: 12 g/dL — ABNORMAL LOW (ref 13.0–17.0)
MCH: 31.5 pg (ref 26.0–34.0)
MCHC: 34.7 g/dL (ref 30.0–36.0)
MCV: 90.8 fL (ref 78.0–100.0)
RDW: 13.6 % (ref 11.5–15.5)

## 2013-01-02 MED ORDER — SODIUM CHLORIDE 0.9 % IJ SOLN: 3.0000 mL | INTRAMUSCULAR | Status: DC | PRN

## 2013-01-02 MED ORDER — MOVING RIGHT ALONG BOOK
Freq: Once | Status: AC
  Administered 2013-01-02: 12:00:00
  Filled 2013-01-02: qty 1

## 2013-01-02 MED ORDER — ALUM & MAG HYDROXIDE-SIMETH 200-200-20 MG/5ML PO SUSP: 15.0000 mL | ORAL | Status: DC | PRN

## 2013-01-02 MED ORDER — CALCIUM CARBONATE ANTACID 500 MG PO CHEW
1.0000 | CHEWABLE_TABLET | ORAL | Status: DC | PRN
  Filled 2013-01-02: qty 1

## 2013-01-02 MED ORDER — INSULIN ASPART 100 UNIT/ML ~~LOC~~ SOLN
0.0000 [IU] | Freq: Three times a day (TID) | SUBCUTANEOUS | Status: DC
  Administered 2013-01-02 (×2): 8 [IU] via SUBCUTANEOUS
  Administered 2013-01-03: 12 [IU] via SUBCUTANEOUS
  Administered 2013-01-03 (×2): 4 [IU] via SUBCUTANEOUS
  Administered 2013-01-04: 2 [IU] via SUBCUTANEOUS
  Administered 2013-01-04: 8 [IU] via SUBCUTANEOUS
  Administered 2013-01-04: 4 [IU] via SUBCUTANEOUS
  Administered 2013-01-04 – 2013-01-05 (×2): 2 [IU] via SUBCUTANEOUS

## 2013-01-02 MED ORDER — POTASSIUM CHLORIDE CRYS ER 20 MEQ PO TBCR
20.0000 meq | EXTENDED_RELEASE_TABLET | Freq: Two times a day (BID) | ORAL | Status: DC
  Administered 2013-01-02 – 2013-01-04 (×6): 20 meq via ORAL
  Filled 2013-01-02 (×9): qty 1

## 2013-01-02 MED ORDER — SODIUM CHLORIDE 0.9 % IV SOLN: 250.0000 mL | INTRAVENOUS | Status: DC | PRN

## 2013-01-02 MED ORDER — FUROSEMIDE 40 MG PO TABS
40.0000 mg | ORAL_TABLET | Freq: Every day | ORAL | Status: DC
  Administered 2013-01-02 – 2013-01-04 (×3): 40 mg via ORAL
  Filled 2013-01-02 (×4): qty 1

## 2013-01-02 MED ORDER — MAGNESIUM HYDROXIDE 400 MG/5ML PO SUSP: 30.0000 mL | Freq: Every day | ORAL | Status: DC | PRN

## 2013-01-02 MED ORDER — ZOLPIDEM TARTRATE 5 MG PO TABS: 5.0000 mg | ORAL_TABLET | Freq: Every evening | ORAL | Status: DC | PRN

## 2013-01-02 MED ORDER — ISOSORBIDE MONONITRATE ER 30 MG PO TB24
30.0000 mg | ORAL_TABLET | Freq: Every day | ORAL | Status: DC
  Administered 2013-01-02 – 2013-01-04 (×3): 30 mg via ORAL
  Filled 2013-01-02 (×4): qty 1

## 2013-01-02 MED ORDER — INSULIN DETEMIR 100 UNIT/ML ~~LOC~~ SOLN
40.0000 [IU] | Freq: Two times a day (BID) | SUBCUTANEOUS | Status: DC
  Administered 2013-01-02 (×2): 40 [IU] via SUBCUTANEOUS

## 2013-01-02 MED ORDER — LISINOPRIL 10 MG PO TABS
10.0000 mg | ORAL_TABLET | Freq: Every day | ORAL | Status: DC
  Administered 2013-01-03: 10 mg via ORAL
  Filled 2013-01-02 (×2): qty 1

## 2013-01-02 MED ORDER — SODIUM CHLORIDE 0.9 % IJ SOLN
3.0000 mL | Freq: Two times a day (BID) | INTRAMUSCULAR | Status: DC
  Administered 2013-01-02 – 2013-01-04 (×5): 3 mL via INTRAVENOUS

## 2013-01-02 MED ORDER — METFORMIN HCL 850 MG PO TABS
850.0000 mg | ORAL_TABLET | Freq: Two times a day (BID) | ORAL | Status: DC
  Administered 2013-01-02 (×2): 850 mg via ORAL
  Filled 2013-01-02 (×6): qty 1

## 2013-01-02 NOTE — Progress Notes (Signed)
Pt ambulated 150 ft with rolling walker. Tolerated well on 2 L 02. Back to bed after ambulation. George [name]

## 2013-01-02 NOTE — Progress Notes (Signed)
Report called to Shanda Bumps, RN and met nurse at bedside.  Patient transferred to 2012 from 2301 and attached to tele monitor without complication.

## 2013-01-02 NOTE — Progress Notes (Signed)
CARDIAC REHAB PHASE I   PRE:  Rate/Rhythm: 115 ST  BP:  Supine:   Sitting: 140/60  Standing:    SaO2: 93%2L  MODE:  Ambulation: 200 ft   POST:  Rate/Rhythem: 128 ST  BP:  Supine:   Sitting: 150/72  Standing:    SaO2: 92-93%2L 1235-1340 Assisted pt from bed to chair so he could eat lunch. Came back 20 mins. Later to walk. Pt walked 200 ft on 2L with slow, steady gait. C/o back pain by end of walk. To recliner with call bell. Encouraged pt to walk once more before bedtime. Pt worried that wife will not be able to assist at home because of her size. Reassured pt that he will be getting stronger each day and that we can work with family so that they know how to assist him with mobility. To recliner with call bell. Left on 2 L and encouraged IS.  Duanne Limerick

## 2013-01-02 NOTE — Progress Notes (Signed)
2 Days Post-Op Procedure(s) (LRB): CORONARY ARTERY BYPASS GRAFTING (CABG) (N/A) RADIAL ARTERY HARVEST (Left) Subjective: No complaints  Objective: Vital signs in last 24 hours: Temp:  [97.8 F (36.6 C)-99.5 F (37.5 C)] 99.5 F (37.5 C) (02/20 2318) Pulse Rate:  [86-116] 102 (02/21 0700) Cardiac Rhythm:  [-] Normal sinus rhythm;Sinus tachycardia (02/20 2000) Resp:  [13-26] 24 (02/21 0700) BP: (96-156)/(58-92) 126/76 mmHg (02/21 0700) SpO2:  [91 %-97 %] 96 % (02/21 0700) Weight:  [245 lb (111.131 kg)] 245 lb (111.131 kg) (02/21 0500)  Hemodynamic parameters for last 24 hours: PAP: (52)/(26) 52/26 mmHg CO:  [6.7 L/min] 6.7 L/min CI:  [3 L/min/m2] 3 L/min/m2  Intake/Output from previous day: 02/20 0701 - 02/21 0700 In: 626.7 [I.V.:526.7; IV Piggyback:100] Out: 1410 [Urine:1350; Chest Tube:60] Intake/Output this shift:    General appearance: alert and no distress Neurologic: intact Heart: regular rate and rhythm Lungs: diminished breath sounds bibasilar Abdomen: normal findings: soft, non-tender  Lab Results:  Recent Labs  01/01/13 1700 01/01/13 1723 01/02/13 0338  WBC 14.8*  --  13.6*  HGB 12.0* 12.2* 12.0*  HCT 35.6* 36.0* 34.6*  PLT 104*  --  102*   BMET:  Recent Labs  01/01/13 0350  01/01/13 1723 01/02/13 0338  NA 139  --  138 136  K 4.7  --  4.6 4.5  CL 106  --  104 101  CO2 25  --   --  26  GLUCOSE 120*  --  177* 225*  BUN 10  --  12 17  CREATININE 0.98  < > 1.00 1.00  CALCIUM 8.5  --   --  8.8  < > = values in this interval not displayed.  PT/INR:  Recent Labs  12/31/12 1400  LABPROT 16.2*  INR 1.33   ABG    Component Value Date/Time   PHART 7.336* 12/31/2012 1901   HCO3 20.8 12/31/2012 1901   TCO2 24 01/01/2013 1723   ACIDBASEDEF 5.0* 12/31/2012 1901   O2SAT 93.0 12/31/2012 1901   CBG (last 3)   Recent Labs  01/01/13 1933 01/01/13 2316 01/02/13 0343  GLUCAP 269* 232* 204*    Assessment/Plan: S/P Procedure(s) (LRB): CORONARY  ARTERY BYPASS GRAFTING (CABG) (N/A) RADIAL ARTERY HARVEST (Left) Plan for transfer to step-down: see transfer orders CV- s/p CABG x 3  imdur for radial graft RESP- pulmonary hygiene RENAL- lytes, creatinine OK DM- hyperdlycemic overnight, increase levimir, start metformin   LOS: 4 days    HENDRICKSON,STEVEN C 01/02/2013

## 2013-01-03 DIAGNOSIS — I480 Paroxysmal atrial fibrillation: Secondary | ICD-10-CM

## 2013-01-03 LAB — BASIC METABOLIC PANEL
BUN: 17 mg/dL (ref 6–23)
CO2: 23 mEq/L (ref 19–32)
Glucose, Bld: 180 mg/dL — ABNORMAL HIGH (ref 70–99)
Potassium: 4.2 mEq/L (ref 3.5–5.1)
Sodium: 138 mEq/L (ref 135–145)

## 2013-01-03 LAB — GLUCOSE, CAPILLARY
Glucose-Capillary: 167 mg/dL — ABNORMAL HIGH (ref 70–99)
Glucose-Capillary: 183 mg/dL — ABNORMAL HIGH (ref 70–99)
Glucose-Capillary: 157 mg/dL — ABNORMAL HIGH (ref 70–99)

## 2013-01-03 LAB — CBC
HCT: 35.1 % — ABNORMAL LOW (ref 39.0–52.0)
Hemoglobin: 11.9 g/dL — ABNORMAL LOW (ref 13.0–17.0)
MCH: 30.9 pg (ref 26.0–34.0)
RBC: 3.85 MIL/uL — ABNORMAL LOW (ref 4.22–5.81)

## 2013-01-03 MED ORDER — METOPROLOL TARTRATE 25 MG PO TABS
25.0000 mg | ORAL_TABLET | Freq: Two times a day (BID) | ORAL | Status: DC
  Administered 2013-01-03 – 2013-01-04 (×4): 25 mg via ORAL
  Filled 2013-01-03 (×6): qty 1

## 2013-01-03 MED ORDER — METFORMIN HCL 500 MG PO TABS
1000.0000 mg | ORAL_TABLET | Freq: Two times a day (BID) | ORAL | Status: DC
  Administered 2013-01-03 – 2013-01-05 (×5): 1000 mg via ORAL
  Filled 2013-01-03 (×7): qty 2

## 2013-01-03 MED ORDER — AMIODARONE HCL IN DEXTROSE 360-4.14 MG/200ML-% IV SOLN
1.0000 mg/min | INTRAVENOUS | Status: DC
  Administered 2013-01-03: 0.999 mg/min via INTRAVENOUS
  Administered 2013-01-03: 1 mg/min via INTRAVENOUS
  Filled 2013-01-03: qty 200

## 2013-01-03 MED ORDER — AMIODARONE HCL 200 MG PO TABS
400.0000 mg | ORAL_TABLET | Freq: Once | ORAL | Status: AC
  Administered 2013-01-03: 400 mg via ORAL
  Filled 2013-01-03: qty 2

## 2013-01-03 MED ORDER — INSULIN DETEMIR 100 UNIT/ML ~~LOC~~ SOLN
15.0000 [IU] | Freq: Every day | SUBCUTANEOUS | Status: DC
  Administered 2013-01-03 – 2013-01-04 (×2): 15 [IU] via SUBCUTANEOUS

## 2013-01-03 MED ORDER — ATORVASTATIN CALCIUM 20 MG PO TABS
20.0000 mg | ORAL_TABLET | Freq: Every day | ORAL | Status: DC
  Administered 2013-01-03 – 2013-01-04 (×2): 20 mg via ORAL
  Filled 2013-01-03 (×3): qty 1

## 2013-01-03 MED ORDER — AMIODARONE HCL IN DEXTROSE 360-4.14 MG/200ML-% IV SOLN: 60.0000 mg/h | INTRAVENOUS | Status: DC

## 2013-01-03 MED ORDER — AMIODARONE HCL 200 MG PO TABS
400.0000 mg | ORAL_TABLET | Freq: Two times a day (BID) | ORAL | Status: DC
  Administered 2013-01-03 – 2013-01-04 (×4): 400 mg via ORAL
  Filled 2013-01-03 (×7): qty 2

## 2013-01-03 MED ORDER — AMIODARONE HCL IN DEXTROSE 360-4.14 MG/200ML-% IV SOLN
0.5000 mg/min | INTRAVENOUS | Status: DC
  Administered 2013-01-03: 0.5 mg/min via INTRAVENOUS
  Filled 2013-01-03 (×5): qty 200

## 2013-01-03 MED ORDER — METOPROLOL TARTRATE 1 MG/ML IV SOLN
5.0000 mg | Freq: Once | INTRAVENOUS | Status: AC
  Administered 2013-01-03: 5 mg via INTRAVENOUS

## 2013-01-03 NOTE — Op Note (Signed)
NAME:  George, [name] NO.:  [id number]  MEDICAL RECORD NO.:  [id number]  LOCATION:  2012                         FACILITY:  [facility]  PHYSICIAN:  Salvatore Decent. Dorris Fetch, M.D.DATE OF BIRTH:  07/30/1961  DATE OF PROCEDURE:  12/31/2012 DATE OF DISCHARGE:                              OPERATIVE REPORT   POSTOPERATIVE DIAGNOSIS:  Severe three-vessel coronary disease with unstable angina.  POSTOPERATIVE DIAGNOSIS:  Severe three-vessel coronary disease with unstable angina.  PROCEDURE:  Median sternotomy, extracorporeal circulation, coronary artery bypass grafting x4 (left internal mammary artery to the LAD, sequential saphenous vein graft to obtuse marginal 1 and 2, left radial artery to posterior descending), endoscopic vein harvest right thigh.  SURGEON:  Salvatore Decent. Dorris Fetch, M.D.  ASSISTANTS: 1. Erin Barrett 2. Coral Ceo, PA.  ANESTHESIA:  General.  FINDINGS:  Good quality conduits, posterior descending fair quality target, remaining targets good quality.  CLINICAL NOTE:  George [name] is a 52 year old gentleman with a history of coronary disease and previous stent placement in 2005.  He also has multiple cardiac risk factors, including untreated diabetes.  He was admitted with unstable angina.  He ruled out for MI, but at catheterization was found to have severe three-vessel coronary disease with preserved left ventricular function.  He was advised to undergo coronary artery bypass grafting.  The indications, risks, benefits, and alternatives were discussed in detail with the patient.  He understood and accepted the risks and agreed to proceed.  We did discuss the use of the left internal mammary artery as well as left radial artery.  He was not a candidate for bilateral mammary arteries.  OPERATIVE NOTE:  George [name] was brought to the preoperative holding area on December 31, 2012.  There Anesthesia placed a Swan-Ganz catheter and arterial blood  pressure monitoring line.  Intravenous antibiotics were administered.  He was taken to the operating room, anesthetized, and intubated.  A Foley catheter was placed.  The chest, abdomen, legs, and left arm were prepped and draped in usual sterile fashion.  The conduits were harvested simultaneously.  A median sternotomy was performed and the left internal mammary artery was harvested using direct vision and electrocautery.  An incision made in the volar aspect of the left wrist distally and short segment of the radial artery was dissected out.  There was a good distal pulse with proximal occlusion confirming the results of the preoperative Allen's test.  The incision then was extended to just below the antecubital fossa and the radial artery was harvested with the Harmonic Scalpel.  Also simultaneously with this, an incision made in the medial aspect of the right leg at the level of the knee and the greater saphenous vein was harvested from the right thigh endoscopically.  All the conduits turned out to be good Quality. 2000 units of heparin was administered during the vessel harvest, the remainder of the full heparin dose was given prior to opening the pericardium.  After harvesting the conduits, the pericardium was opened.  The ascending aorta was inspected.  There was no atherosclerotic disease.  The aorta was cannulated via concentric 2-0 Ethibond pledgeted pursestring sutures.  A dual-stage venous cannula was placed via pursestring  suture in the right atrial appendage.  After confirming adequate anticoagulation with ACT measurement, cardiopulmonary bypass was instituted and flows were maintained per protocol.  The patient was cooled to 32 degrees Celsius.  The coronary arteries were inspected and anastomotic sites were chosen.  The conduits were inspected and cut to length.  A foam pad was placed in the pericardium to insulate the heart and protect the left phrenic nerve.  A  temperature probe was placed in myocardial septum and a cardioplegic cannula placed in the ascending aorta.  The aorta was crossclamped.  The left ventricle was emptied via the aortic root vent.  Cardiac arrest then was achieved with combination of cold antegrade blood cardioplegia and topical iced saline.  600 mL of cardioplegia was administered.  There was a rapid diastolic arrest and myocardial septal cooling to less than 10 degrees Celsius.  The following distal anastomoses were performed.  First the distal end of the left radial artery was beveled and was anastomosed end-to-side to the posterior descending branch of the right coronary.  Right coronary was heavily and diffusely diseased.  The posterior descending branch was grafted just beyond its origin.  There was plaque at its origin.  More distally it was relatively free of disease.  It did accept a 1.5 mm probe proximally and distally.  The vessel bifurcated just beyond the anastomosis and the 2 branches which both accepted a 1 mm probe.  The radial artery was anastomosed end-to- side with a running 8-0 Prolene suture.  At completion of the anastomosis, cardioplegia was administered antegrade once again.  There was good cooling and good backflow from the radial artery. Next, a reversed saphenous vein graft was placed sequentially to obtuse marginals 1 and 2.  Both vessels accepted a 1.5 mm probe distally.  OM1 was the larger of the 2 vessels.  A side-to-side anastomosis was performed to OM1 and end-to-side to OM 2.  Both were done with running 7- 0 Prolene sutures.  At the completion of the graft, cardioplegia was administered down the vein graft.  There was good flow through the graft and good hemostasis at both anastomoses.  Next, the left internal mammary artery was brought through a window in the pericardium.  The distal end was beveled.  It was then anastomosed end-to-side to the distal LAD.  This was a 1.5 mm target  vessel.  The mammary was 1.5 mm conduit and end-to-side anastomosis was performed with a running 8-0 Prolene suture.  At the completion of the mammary to LAD anastomosis, the bulldog clamp was briefly removed to inspect for hemostasis.  Immediate rapid septal rewarming was noted.  The bulldog clamp was replaced.  The mammary pedicle was tacked to the epicardial surface of the heart with 6-0 Prolene sutures.  Additional cardioplegia was administered.  The vein graft and radial artery were cut to length.  The proximal anastomoses were performed to 4.0 mm punch aortotomies with running 6-0 Prolene sutures.  At the completion of final proximal anastomosis, the patient was placed in Trendelenburg position.  Lidocaine was administered.  The aortic root was de-aired.  The bulldog clamps removed from the left mammary artery. After de-airing the aortic root, the aortic crossclamp was removed. Total crossclamp time was 81 minute.  While rewarming was completed, all proximal and distal anastomoses were inspected for hemostasis.  Epicardial pacing wires were placed on the right ventricle and right atrium.  When the patient had rewarmed to a core temperature of 37 degrees Celsius, he  was weaned from cardiopulmonary bypass on the first attempt without inotropic support. The total bypass time was 120 minutes.  The initial cardiac index was greater than 2 liters/minute/meter squared.  The patient remained hemodynamically stable throughout post-bypass period.  A test dose of protamine was administered and was well tolerated.  The atrial and aortic cannulae were removed.  The remainder of the protamine was administered without incident.  The chest was irrigated with warm saline.  Hemostasis was achieved.  A left pleural and mediastinal chest tubes were placed through a separate subcostal incisions.  The pericardium was reapproximated over the ascending aorta with interrupted 3-0 silk sutures.  The  sternum was closed with interrupted heavy gauge single and double stainless steel wires.  The pectoralis fascia, subcutaneous tissue, and skin were closed in standard fashion.  The radial incision had been closed after harvest of the vessel with a running 2-0 Vicryl subcutaneous suture and 3-0 Vicryl subcuticular suture.  The leg incisions were closed in a similar fashion.  All sponge, needle, and instrument counts were correct at the end of the procedure.  The patient was taken from the operating room to the surgical intensive care unit in good condition.     Salvatore Decent Dorris Fetch, M.D.     SCH/MEDQ  D:  01/02/2013  T:  01/03/2013  Job:  [id number]

## 2013-01-03 NOTE — Progress Notes (Addendum)
                   [address]            [place] [address]          [phone]      3 Days Post-Op Procedure(s) (LRB): CORONARY ARTERY BYPASS GRAFTING (CABG) (N/A) RADIAL ARTERY HARVEST (Left)  Subjective: Patient is eating breakfast this morning.He is passing gas but no bowel movement yet. He also feels a "popping" in his proximal sternum when he coughs.  Objective: Vital signs in last 24 hours: Temp:  [97.7 F (36.5 C)-99.9 F (37.7 C)] 98.4 F (36.9 C) (02/22 0602) Pulse Rate:  [100-120] 103 (02/22 0602) Cardiac Rhythm:  [-] Normal sinus rhythm;Sinus tachycardia (02/21 1935) Resp:  [19-20] 20 (02/22 0602) BP: (133-150)/(69-94) 141/94 mmHg (02/22 0602) SpO2:  [95 %-97 %] 97 % (02/22 0602) Weight:  [112 kg (246 lb 14.6 oz)-112.8 kg (248 lb 10.9 oz)] 112.8 kg (248 lb 10.9 oz) (02/22 0602)  Pre op weight 111.1kg Current Weight  01/03/13 112.8 kg (248 lb 10.9 oz)      Intake/Output from previous day: 02/21 0701 - 02/22 0700 In: 280 [P.O.:240; I.V.:40] Out: 1900 [Urine:1900]   Physical Exam:  Cardiovascular: RRR, no murmurs, gallops, or rubs. Pulmonary: Diminished at bases; no rales, wheezes, or rhonchi. Abdomen: Soft, non tender, bowel sounds present. Extremities: Mild bilateral lower extremity edema. Motor/sensory intact left forearm. Wounds: Clean and dry.  No erythema or signs of infection.  Lab Results: CBC: Recent Labs  01/01/13 1700 01/01/13 1723 01/02/13 0338  WBC 14.8*  --  13.6*  HGB 12.0* 12.2* 12.0*  HCT 35.6* 36.0* 34.6*  PLT 104*  --  102*   BMET:  Recent Labs  01/01/13 0350  01/01/13 1723 01/02/13 0338  NA 139  --  138 136  K 4.7  --  4.6 4.5  CL 106  --  104 101  CO2 25  --   --  26  GLUCOSE 120*  --  177* 225*  BUN 10  --  12 17  CREATININE 0.98  < > 1.00 1.00  CALCIUM 8.5  --   --  8.8  < > = values in this interval not displayed.  PT/INR:  Lab Results  Component Value Date   INR 1.33 12/31/2012   INR  1.14 12/29/2012   ABG:  INR: Will add last result for INR, ABG once components are confirmed Will add last 4 CBG results once components are confirmed  Assessment/Plan:  1. CV - Went into afib with RVR earlier this am.Now ST/SR. On Amiodarone drip, Lopressor 12.5 bid, Imdur 30 daily, and Lisinopril 10 daily. Will increase Lopressor to 25 bid, stop Amiodarone drip after 10 am,and start Amiodarone po. 2.  Pulmonary - On 2 liters of oxygen via Leslie-wean as tolerates.Encourage incentive spirometer. 3. Volume Overload - On Lasix 40 daily 4.  Acute blood loss anemia - Last H and H stable at 12 and 34.6 5.DM-CBGs 214/218/173.Pre op HGA1C 12. On Metformin 850 bid and Levemir. Will increase Metformin and give Insulin PRN. If needs further glucose control, will add another agent.He will need follow up with a medical doctor upon discharge. 6.Thrombocytopenia-last platelets 102,000  ZIMMERMAN,DONIELLE MPA-C 01/03/2013,7:25 AM  patient examined and medical record reviewed,agree with above note. VAN TRIGT III,Syesha Thaw 01/03/2013

## 2013-01-03 NOTE — Progress Notes (Signed)
CARDIAC REHAB PHASE I   PRE:  Rate/Rhythm: 101 ST  BP:  Supine:   Sitting: 138/84  Standing:    SaO2: 95%2L  MODE:  Ambulation: 360 ft   POST:  Rate/Rhythem: 113 ST  BP:  Supine:   Sitting: 170/80  Standing:    SaO2: 100%2L 0950-1030 Pt hesitant to walk due to atrial fib earlier. Discussed with pt that he is in NSR now and getting IV amiodarone. Pt agreed then and walked 360 ft on 2L with rolling walker and asst x 1. Tired by end of walk. BP elevated. Notified pt's RN. To chair with call bell. Did not want to try to wean oxygen this walk due to pt's nervousness about walking. Does appear SOB with walk but sats good. Encouraged walks with staff.  Duanne Limerick

## 2013-01-03 NOTE — Progress Notes (Signed)
Approx 0208 pt cardiac rhythm up to 160s-170s. Pt lying in bed sleeping and unaware of increased heartrate. Pt b/p 127/85 2L02 sat 97 % EKG obtained and temp pacer at bedside. Rapid response RN and MD notified.  RR RN arrived to room to assess pt. MD orders taken and carried out. Amiodarone po and IV with metoprolol IV given. Will continue to monitor pt hr and vital signs.

## 2013-01-03 NOTE — Progress Notes (Signed)
Patient ID: George [name], male   DOB: 1960-11-22, 52 y.o.   MRN: [id number]    Subjective:  Denies SSCP, palpitations or Dyspnea Chest sore  Objective:  Filed Vitals:   01/02/13 1346 01/02/13 2021 01/03/13 0500 01/03/13 0602  BP: 145/79 150/80  141/94  Pulse: 120 114  103  Temp: 99.9 F (37.7 C) 98.5 F (36.9 C)  98.4 F (36.9 C)  TempSrc: Oral Oral  Oral  Resp: 20 20  20   Height:      Weight:   246 lb 14.6 oz (112 kg) 248 lb 10.9 oz (112.8 kg)  SpO2: 95% 96%  97%    Intake/Output from previous day:  Intake/Output Summary (Last 24 hours) at 01/03/13 0901 Last data filed at 01/03/13 0200  Gross per 24 hour  Intake    240 ml  Output   1900 ml  Net  -1660 ml    Physical Exam: Affect appropriate Overweight white male HEENT: normal Neck supple with no adenopathy JVP normal no bruits no thyromegaly Lungs clear with no wheezing and good diaphragmatic motion Heart:  S1/S2 no murmur, no rub, gallop or click Sternum stable PMI normal Abdomen: benighn, BS positve, no tenderness, no AAA no bruit.  No HSM or HJR Distal pulses intact with no bruits left radial harvested Trace bilateral edema Neuro non-focal Skin warm and dry No muscular weakness   Lab Results: Basic Metabolic Panel:  Recent Labs  62/95/28 0350 01/01/13 1700  01/02/13 0338 01/03/13 0625  NA 139  --   < > 136 138  K 4.7  --   < > 4.5 4.2  CL 106  --   < > 101 101  CO2 25  --   --  26 23  GLUCOSE 120*  --   < > 225* 180*  BUN 10  --   < > 17 17  CREATININE 0.98 0.98  < > 1.00 0.78  CALCIUM 8.5  --   --  8.8 9.4  MG 2.4 2.3  --   --   --   < > = values in this interval not displayed. CBC:  Recent Labs  01/02/13 0338 01/03/13 0625  WBC 13.6* 12.3*  HGB 12.0* 11.9*  HCT 34.6* 35.1*  MCV 90.8 91.2  PLT 102* 146*    Imaging: Dg Chest Portable 1 View In Am  01/02/2013  *RADIOLOGY REPORT*  Clinical Data: Postop CABG, follow  PORTABLE CHEST - 1 VIEW  Comparison: Portable chest x-ray of  01/01/2013  Findings: The Swan-Ganz catheter has been removed with a venous sheath remaining in the SVC.  No pneumothorax is seen.  Mild basilar volume loss remains.  Cardiomegaly is stable.  The left chest tube has been removed.  IMPRESSION: Left chest tube and Swan-Ganz catheter removed.  No pneumothorax   Original Report Authenticated By: Dwyane Dee, M.D.     Cardiac Studies:   Telemetry: PAF SR this am on amiodarone   Medications:   . acetaminophen  1,000 mg Oral Q6H   Or  . acetaminophen (TYLENOL) oral liquid 160 mg/5 mL  975 mg Per Tube Q6H  . amiodarone  400 mg Oral BID  . aspirin EC  325 mg Oral Daily   Or  . aspirin  324 mg Per Tube Daily  . bisacodyl  10 mg Oral Daily   Or  . bisacodyl  10 mg Rectal Daily  . docusate sodium  200 mg Oral Daily  . furosemide  40 mg Oral  Daily  . insulin aspart  0-24 Units Subcutaneous TID AC & HS  . insulin detemir  15 Units Subcutaneous Daily  . isosorbide mononitrate  30 mg Oral Daily  . lisinopril  10 mg Oral Daily  . metFORMIN  1,000 mg Oral BID WC  . metoprolol tartrate  25 mg Oral BID  . omega-3 acid ethyl esters  1 g Oral BID  . pantoprazole  40 mg Oral Daily  . potassium chloride  20 mEq Oral BID  . simvastatin  40 mg Oral QHS  . sodium chloride  3 mL Intravenous Q12H     . amiodarone (NEXTERONE PREMIX) 360 mg/200 mL dextrose      Assessment/Plan:  CAD: post CABG stable EF good Nitrates for radial/arterial graft PAF:  AGree with metoprolol and amiodarone.   DM:  Major issue with poor control prior to admission on metformin bid F/U [place] Cholesterol:  Continue statin  Charlton Haws 01/03/2013, 9:01 AM

## 2013-01-04 LAB — GLUCOSE, CAPILLARY: Glucose-Capillary: 142 mg/dL — ABNORMAL HIGH (ref 70–99)

## 2013-01-04 MED ORDER — METFORMIN HCL 1000 MG PO TABS: 1000.0000 mg | ORAL_TABLET | Freq: Two times a day (BID) | ORAL | Status: DC

## 2013-01-04 MED ORDER — FUROSEMIDE 40 MG PO TABS: 40.0000 mg | ORAL_TABLET | Freq: Every day | ORAL | Status: DC

## 2013-01-04 MED ORDER — OXYCODONE HCL 5 MG PO TABS: 5.0000 mg | ORAL_TABLET | ORAL | Status: DC | PRN

## 2013-01-04 MED ORDER — LACTULOSE 10 GM/15ML PO SOLN
20.0000 g | Freq: Once | ORAL | Status: AC
  Administered 2013-01-04: 20 g via ORAL
  Filled 2013-01-04: qty 30

## 2013-01-04 MED ORDER — LISINOPRIL 20 MG PO TABS
20.0000 mg | ORAL_TABLET | Freq: Every day | ORAL | Status: DC
  Administered 2013-01-04: 20 mg via ORAL
  Filled 2013-01-04 (×2): qty 1

## 2013-01-04 MED ORDER — POTASSIUM CHLORIDE CRYS ER 20 MEQ PO TBCR: 20.0000 meq | EXTENDED_RELEASE_TABLET | Freq: Every day | ORAL | Status: DC

## 2013-01-04 MED ORDER — GLUCOSE BLOOD VI STRP: ORAL_STRIP | Status: DC

## 2013-01-04 MED ORDER — AMIODARONE HCL 400 MG PO TABS: ORAL_TABLET | ORAL | Status: DC

## 2013-01-04 NOTE — Discharge Summary (Signed)
Physician Discharge Summary  Patient ID: George [name] MRN: [id number] DOB/AGE: 18-Jul-1961 52 y.o.  Admit date: 12/29/2012 Discharge date: 01/05/2013  Admission Diagnoses: 1.History of CAD (s/p MI 51', s/p PCI with stent to RCA) 2.History of hyperlipidemia 3. History of hypertriglyceridemia 4.History of DM 5.History of tobacco abuse  Discharge Diagnoses:  1.History of CAD (s/p MI 32', s/p PCI with stent to RCA) 2.History of hyperlipidemia 3. History of hypertriglyceridemia 4.History of DM 5.History of tobacco abuse 6.Atrial fibrillation (converted to SR) 7.Mild ABL anemia 8.Mild thrombocytopenia  Procedure (s):  Coronary angiography done by Dr. Eden Emms on 12/30/2012:  Coronary dominance: right  Left mainstem: 30%  Left anterior descending (LAD): 70% multiple discrete proximal and mid vessel  D1: 40% ostial  D2: small vessel  D3: small vessel  Left circumflex (LCx): 95% proximal disease  OM1: 40%  OM2: /AV groove 90%  Right coronary artery (RCA): Long area of 99% mid vessel disease with TIMI 2 flow  Left ventriculography: Left ventricular systolic function is normal, LVEF is estimated at 55-65%, there is no significant mitral regurgitation   2.Median sternotomy, extracorporeal circulation, coronary  artery bypass grafting x4 (left internal mammary artery to the LAD, sequential saphenous vein graft to obtuse marginal 1 and 2, left radial artery to posterior descending), and endoscopic vein harvest, right thigh by Dr. Dorris Fetch on 01/03/2013.  History of Presenting Illness: This is a 52 yo Caucasian with history of CAD dating back to 2005 when he had an MI. He did have a stent placed at that time. He has not been reliable in terms of follow up. He was diagnosed with diabetes about a year ago, but has been non-compliant with medications. He does have a history of HTN and dyslipidemia. He is a former smoker, having quit in 2005 after his MI. He also has a very strong cardiac family  history. He says he has been having CP for the past year. It has become more frequent and severe over the past month. On the morning of admission he was working on his taxes when he developed severe left sided CP. He went to the [facility] ED and was transferred to [facility] for further management. He did rule out  for an MI on this admission. He underwent cardiac catheterization on 12/30/2012, where he was found to have severe 3 vessel CAD. LV EF was 55-65%. A cardiothoracic consultation was obtained with Dr. Dorris Fetch for the consideration of coronary artery bypass grafting surgery. He was chest pain free at the time of being seen  Potential risks, complications, and benefits of the surgery were discussed with the patient and he agreed to proceed. His pre operative carotid duplex US showed no significant internal carotid artery stenosis bilaterally. He underwent a CABG x 4 on 01/03/2013.  Brief Hospital Course:  He was extubated without difficulty the evening of surgery. He remained afebrile and hemodynamically stable. His Theone Murdoch, a line, chest tubes, and foley were all removed early in his post operative course. He was weaned off the insulin drip and started on Metformin. This was titrated. He is still on low dose Levemir in addition to the Metformin. His pre op HGA1C was 12. He had not been taking any medication prior to this admission, even though he was aware he had diabetes. He will close need follow up with his medical doctor. He was volume overloaded and diuresed accordingly.He was started on Lopressor. He was felt surgically stable for transfer from the ICU to 2000 for further  convalescence on 01/02/2013. He went into afib with RVR. He was started on an Amiodarone drip. His Lopressor was increased. He converted to sinus rhythm. His Amiodarone drip was stopped and he was put on oral Amiodarone. He maintained sinus rhythm. He was ambulating well on 1 liter of oxygen via [place]. He has been tolerating a diet and  has had a bowel movement. Epicardial pacing wires and chest tube sutures will be removed prior to his discharge. Provided he is weaned off oxygen, he is afebrile and hemodynamically stable, and pending morning round evaluation, he will be surgically stable for discharge on 01/05/2013.   Latest Vital Signs: Blood pressure 149/81, pulse 91, temperature 97.8 F (36.6 C), temperature source Oral, resp. rate 20, height 5\' 10"  (1.778 m), weight 112.8 kg (248 lb 10.9 oz), SpO2 99.00%.  Physical Exam: Cardiovascular: RRR, no murmurs, gallops, or rubs.  Pulmonary: Diminished at bases; no rales, wheezes, or rhonchi.  Abdomen: Soft, non tender, bowel sounds present.  Extremities: Trace bilateral lower extremity edema. Motor/sensory intact left forearm.  Wounds: Clean and dry. No erythema or signs of infection.    Discharge Condition:Stable  Recent laboratory studies:  Lab Results  Component Value Date   WBC 12.3* 01/03/2013   HGB 11.9* 01/03/2013   HCT 35.1* 01/03/2013   MCV 91.2 01/03/2013   PLT 146* 01/03/2013   Lab Results  Component Value Date   NA 138 01/03/2013   K 4.2 01/03/2013   CL 101 01/03/2013   CO2 23 01/03/2013   CREATININE 0.78 01/03/2013   GLUCOSE 180* 01/03/2013      Diagnostic Studies:    Dg Chest Portable 1 View In Am  01/02/2013  *RADIOLOGY REPORT*  Clinical Data: Postop CABG, follow  PORTABLE CHEST - 1 VIEW  Comparison: Portable chest x-ray of 01/01/2013  Findings: The Swan-Ganz catheter has been removed with a venous sheath remaining in the SVC.  No pneumothorax is seen.  Mild basilar volume loss remains.  Cardiomegaly is stable.  The left chest tube has been removed.  IMPRESSION: Left chest tube and Swan-Ganz catheter removed.  No pneumothorax   Original Report Authenticated By: Dwyane Dee, M.D.    Discharge Medications:   Medication List    STOP taking these medications       nitroGLYCERIN 0.4 MG SL tablet  Commonly known as:  NITROSTAT      TAKE these  medications       amiodarone 400 MG tablet  Commonly known as:  PACERONE  Amiodarone 400 mg po two times daily for 5 days;then take Amiodarone 400 mg po daily thereafter     aspirin 325 MG tablet  Take 325 mg by mouth daily.     diazepam 10 MG tablet  Commonly known as:  VALIUM  Take 10 mg by mouth every 6 (six) hours as needed for anxiety.     Fish Oil 500 MG Caps  Take 1 capsule by mouth 3 (three) times daily.     furosemide 40 MG tablet  Commonly known as:  LASIX  Take 1 tablet (40 mg total) by mouth daily. For 4 days then stop.     glucose blood test strip  Commonly known as:  ACCU-CHEK ACTIVE STRIPS  Use as instructed     lisinopril 20 MG tablet  Commonly known as:  PRINIVIL,ZESTRIL  Take 20 mg by mouth daily.     metFORMIN 1000 MG tablet  Commonly known as:  GLUCOPHAGE  Take 1 tablet (1,000 mg total) by  mouth 2 (two) times daily with a meal.     metoprolol tartrate 25 MG tablet  Commonly known as:  LOPRESSOR  Take 25 mg by mouth 2 (two) times daily.     omeprazole 40 MG capsule  Commonly known as:  PRILOSEC  Take 40 mg by mouth daily.     oxyCODONE 5 MG immediate release tablet  Commonly known as:  Oxy IR/ROXICODONE  Take 1-2 tablets (5-10 mg total) by mouth every 4 (four) hours as needed for pain.     oxyCODONE-acetaminophen 10-325 MG per tablet  Commonly known as:  PERCOCET  Take 1 tablet by mouth every 4 (four) hours as needed for pain.     potassium chloride SA 20 MEQ tablet  Commonly known as:  K-DUR,KLOR-CON  Take 1 tablet (20 mEq total) by mouth daily. For 4 days then stop.     simvastatin 40 MG tablet  Commonly known as:  ZOCOR  Take 40 mg by mouth at bedtime.       The patient has been discharged on:   1.Beta Blocker:  Yes [  x ]                              No   [   ]                              If No, reason:  2.Ace Inhibitor/ARB: Yes [   ]                                     No  [  x  ]                                     If No,  reason:Preserved LVEF  3.Statin:   Yes [ x  ]                  No  [   ]                  If No, reason:  4.Ecasa:  Yes  [  x ]                  No   [   ]                  If No, reason: Follow Up Appointments:     Follow-up Information   Follow up with Charlton Haws, MD. (Call for a follow up appointment for 2 weeks)    Contact information:   [address] [place] [address] [phone]       Follow up with Kirk Ruths, MD. (Call for follow up appointment 1 week for further diabetes managment (pre op HGA1C 12)    Contact information:   [address] [place] [address] [phone]       Follow up with HENDRICKSON,STEVEN C, MD. (PA/LAT CXR to be taken (at [facility] which is in the same building as Dr. Sunday Corn office) 45 minutes prior to office appointment;Office will mail appintment date and time)    Contact information:   [address] [place] [address] [phone]       Signed: Doree Fudge MPA-C 01/04/2013, 11:06 AM

## 2013-01-04 NOTE — Progress Notes (Signed)
Patient was 93% on room air. Informed patient that he did not need O2. Nurse tech went in room to take vital signs and said patient had placed the O2 back on. Lajuana Matte, RN

## 2013-01-04 NOTE — Progress Notes (Addendum)
                   [address]            [place] [address]          [phone]      4 Days Post-Op Procedure(s) (LRB): CORONARY ARTERY BYPASS GRAFTING (CABG) (N/A) RADIAL ARTERY HARVEST (Left)  Subjective: Patient is eating breakfast this morning.He is passing gas but no bowel movement yet.   Objective: Vital signs in last 24 hours: Temp:  [97.8 F (36.6 C)-97.9 F (36.6 C)] 97.8 F (36.6 C) (02/23 0715) Pulse Rate:  [90-91] 91 (02/23 0715) Cardiac Rhythm:  [-] Sinus tachycardia (02/22 2030) Resp:  [18-20] 20 (02/23 0715) BP: (134-149)/(81-84) 149/81 mmHg (02/23 0715) SpO2:  [96 %-99 %] 99 % (02/23 0715)  Pre op weight 111.1kg Current Weight  01/03/13 112.8 kg (248 lb 10.9 oz)      Intake/Output from previous day: 02/22 0701 - 02/23 0700 In: 921.7 [P.O.:480; I.V.:441.7] Out: 525 [Urine:525]   Physical Exam:  Cardiovascular: RRR, no murmurs, gallops, or rubs. Pulmonary: Diminished at bases; no rales, wheezes, or rhonchi. Abdomen: Soft, non tender, bowel sounds present. Extremities: Trace bilateral lower extremity edema. Motor/sensory intact left forearm. Wounds: Clean and dry.  No erythema or signs of infection.  Lab Results: CBC:  Recent Labs  01/02/13 0338 01/03/13 0625  WBC 13.6* 12.3*  HGB 12.0* 11.9*  HCT 34.6* 35.1*  PLT 102* 146*   BMET:   Recent Labs  01/02/13 0338 01/03/13 0625  NA 136 138  K 4.5 4.2  CL 101 101  CO2 26 23  GLUCOSE 225* 180*  BUN 17 17  CREATININE 1.00 0.78  CALCIUM 8.8 9.4    PT/INR:  Lab Results  Component Value Date   INR 1.33 12/31/2012   INR 1.14 12/29/2012   ABG:  INR: Will add last result for INR, ABG once components are confirmed Will add last 4 CBG results once components are confirmed  Assessment/Plan:  1. CV - Previous afib with RVR. Maintaining SR. On Amiodarone 400 bid, Lopressor 25 bid, Imdur 30 daily, and Lisinopril 10 daily. Will increase Lisinopril to pre op dose of 20  for better BP control. 2.  Pulmonary - On 1 liter of oxygen via [place]-wean as tolerates.Encourage incentive spirometer. 3. Volume Overload - On Lasix 40 daily 4.  Acute blood loss anemia - Last H and H stable at 12 and 34.6 5.DM-CBGs 183/157/177.Pre op HGA1C 12. On Metformin 850 bid and Levemir.He will need follow up with a medical doctor upon discharge. 6.Thrombocytopenia-last platelets 102,000 7.LOC constipation 8.Remove EPW 9.Possible discharge in am  ZIMMERMAN,DONIELLE MPA-C 01/04/2013,7:55 AM  Still requires 2 L nasal cannula, Lasix continued sinus rhythm on oral amiodarone Left arm with normal sensation and motor function Sternal incision clean and dry  will need diabetic management-teaching for Levemir injections at home

## 2013-01-04 NOTE — Progress Notes (Signed)
DC'd EPW and CT sutures per MD order per hospital policy. Patient tolerated well, VSS. Pacing wires intact upon removal. Applied benzoin and steri strips to CT suture site. Reminded patient to remain in bed for 1 hour. Will continue to monitor closely. Lajuana Matte, RN

## 2013-01-05 LAB — GLUCOSE, CAPILLARY: Glucose-Capillary: 158 mg/dL — ABNORMAL HIGH (ref 70–99)

## 2013-01-05 NOTE — Progress Notes (Signed)
Patient ambulated approximately 1000 ft with RW and RN and tolerated well.  Steady on feet, no stops for rest, no SOB.  HR remained stable.  Will continue to monitor and encourage.  George [name]

## 2013-01-05 NOTE — Progress Notes (Signed)
5 Days Post-Op Procedure(s) (LRB): CORONARY ARTERY BYPASS GRAFTING (CABG) (N/A) RADIAL ARTERY HARVEST (Left) Subjective:   Mr. Ranta has no complaints this morning.  States he is ready to be discharged +BM  Objective: Vital signs in last 24 hours: Temp:  [98 F (36.7 C)-98.3 F (36.8 C)] 98 F (36.7 C) (02/24 0412) Pulse Rate:  [86-91] 91 (02/24 0412) Cardiac Rhythm:  [-] Normal sinus rhythm (02/23 2015) Resp:  [16-20] 18 (02/24 0412) BP: (120-134)/(71-83) 133/75 mmHg (02/24 0412) SpO2:  [93 %-100 %] 98 % (02/24 0412) Weight:  [245 lb 9.6 oz (111.403 kg)] 245 lb 9.6 oz (111.403 kg) (02/24 0412)  Intake/Output from previous day: 02/23 0701 - 02/24 0700 In: 240 [P.O.:240] Out: 1 [Urine:1]  General appearance: alert, cooperative and no distress Neurologic: intact Heart: regular rate and rhythm Lungs: clear to auscultation bilaterally Abdomen: soft, non-tender; bowel sounds normal; no masses,  no organomegaly Extremities: edema trace Wound: clean and dry  Lab Results:  Recent Labs  01/03/13 0625  WBC 12.3*  HGB 11.9*  HCT 35.1*  PLT 146*   BMET:  Recent Labs  01/03/13 0625  NA 138  K 4.2  CL 101  CO2 23  GLUCOSE 180*  BUN 17  CREATININE 0.78  CALCIUM 9.4    PT/INR: No results found for this basename: LABPROT, INR,  in the last 72 hours ABG    Component Value Date/Time   PHART 7.336* 12/31/2012 1901   HCO3 20.8 12/31/2012 1901   TCO2 24 01/01/2013 1723   ACIDBASEDEF 5.0* 12/31/2012 1901   O2SAT 93.0 12/31/2012 1901   CBG (last 3)   Recent Labs  01/04/13 1618 01/04/13 2120 01/05/13 0554  GLUCAP 142* 147* 158*    Assessment/Plan: S/P Procedure(s) (LRB): CORONARY ARTERY BYPASS GRAFTING (CABG) (N/A) RADIAL ARTERY HARVEST (Left)  1. CV- Previous Atrial Fibrillation, currently NSR continue Amiodarone, Lopressor, Imdur, Lisinopril 2. Pulm- off oxygen, continue IS use at home 3. Volume overload- improved, weight is at baseline, will give short course  of Lasix at discharge 4. DM- CBGs controlled, will continue Levemir, Metformin at discharge 5. Dispo- patient doing well, will d/c home today   LOS: 7 days    Raford Pitcher, Denny Peon 01/05/2013

## 2013-01-05 NOTE — Progress Notes (Signed)
[id number] Education completed with pt and wife. Understanding voiced. Discussed CRP 2 and permission given to refer to [facility] program. Stressed importance of following diabetic diet, checking sugars and following up with primary MD. Pt states he is very serious about controlling sugars. Offered d/c video but pt did not want to watch at this time.Will Heinkel DunlapRN

## 2013-01-19 ENCOUNTER — Encounter: Payer: Self-pay | Admitting: Adult Health

## 2013-01-19 ENCOUNTER — Ambulatory Visit (INDEPENDENT_AMBULATORY_CARE_PROVIDER_SITE_OTHER): Payer: Medicaid Other | Admitting: Adult Health

## 2013-01-19 VITALS — BP 94/62 | HR 81 | Ht 70.0 in | Wt 241.0 lb

## 2013-01-19 MED ORDER — AMIODARONE HCL 200 MG PO TABS: 200.0000 mg | ORAL_TABLET | Freq: Every day | ORAL | Status: DC

## 2013-01-19 MED ORDER — LISINOPRIL 10 MG PO TABS: 10.0000 mg | ORAL_TABLET | Freq: Every day | ORAL | Status: DC

## 2013-01-19 NOTE — Assessment & Plan Note (Signed)
Blood pressure is very soft today. He is on lisinopril 20 mg daily I will decrease this to 10 mg daily he will followup with Dr. Robby Sermon in next week with blood pressure evaluation at that time. We will see him again in one month. He has stopped taking furosemide as directed postoperatively. Therefore dehydration is not playing into his hypotension. We will continue with ongoing assessment.

## 2013-01-19 NOTE — Patient Instructions (Addendum)
Decrease Amiodarone to 200 mg daily   Decrease Lisinopril to 10 mg daily    Your physician recommends that you schedule a follow-up appointment in: 1 month

## 2013-01-19 NOTE — Assessment & Plan Note (Signed)
He remains in normal sinus rhythm per EKG. His QT interval is slightly prolonged at 0.444. Decreasing dose of amiodarone will be of help. Followup EKG on next visit.

## 2013-01-19 NOTE — Assessment & Plan Note (Signed)
He is to well status post four-vessel coronary bypass grafting. He denies recurrent chest pain, but is continued to have some soreness at the sternotomy site. He also has some soreness at the left forearm vein grafts site. He has been on amiodarone 400 mg daily since discharge. I will decrease this to 200 mg daily. She will only be on this medication for approximately 6 weeks postoperatively. We will discontinue on next visit. He refuses cardiac rehabilitation at this time due to financial issues and lack of insurance. He is awaiting Medicaid and distance to be in place before he is willing to followup with this program. Otherwise the patient will remain on other medications as directed.

## 2013-01-19 NOTE — Progress Notes (Signed)
HPI: [name] is a 52 y/o patient of Dr. Eden [name] we are following for ongoing assessment and management of CAD, s/p hospitalization for chest pain, with cardiac cath demonstrating severe 3 vessel CAD. He had subsequent CABG with x4 (left internal mammary artery to the LAD, sequential saphenous vein graft to obtuse marginal 1 and 2, left radial artery to posterior descending). He had post operative atrial fib and was placed on amiodarone with conversion to NSR.Marland KitchenCotninues on 400 mg of this a day.    Since discharge he has been sore in his chest from sternotomy. He denies recurrent chest pain or racing heart rate.He is lightheaded and sleepy at times.  Blood sugar is labile and followed by Dr. Regino [name].  He is medically compliant. He is due to see Dr. Robby [name] next week for postoperative followup.  No Known Allergies  Current Outpatient Prescriptions  Medication Sig Dispense Refill  . amiodarone (PACERONE) 400 MG tablet Amiodarone 400 mg po two times daily for 5 days;then take Amiodarone 400 mg po daily thereafter  60 tablet  1  . aspirin 325 MG tablet Take 325 mg by mouth daily.        . diazepam (VALIUM) 10 MG tablet Take 10 mg by mouth every 6 (six) hours as needed for anxiety.      . fish oil-omega-3 fatty acids 1000 MG capsule Take 1 g by mouth daily.      [name] glucose blood (ACCU-CHEK ACTIVE STRIPS) test strip Use as instructed  100 each  12  . lisinopril (PRINIVIL,ZESTRIL) 20 MG tablet Take 20 mg by mouth daily.      . metFORMIN (GLUCOPHAGE) 1000 MG tablet Take 1 tablet (1,000 mg total) by mouth 2 (two) times daily with a meal.  60 tablet  1  . metoprolol tartrate (LOPRESSOR) 25 MG tablet Take 25 mg by mouth 2 (two) times daily.      [name] oxyCODONE (OXY IR/ROXICODONE) 5 MG immediate release tablet Take 1-2 tablets (5-10 mg total) by mouth every 4 (four) hours as needed for pain.  40 tablet  0  . oxyCODONE-acetaminophen (PERCOCET) 10-325 MG per tablet Take 1 tablet by mouth every 4 (four) hours as  needed for pain.      . simvastatin (ZOCOR) 40 MG tablet Take 40 mg by mouth at bedtime.        . furosemide (LASIX) 40 MG tablet Take 1 tablet (40 mg total) by mouth daily. For 4 days then stop.  4 tablet  0  . omeprazole (PRILOSEC) 40 MG capsule Take 40 mg by mouth daily.        . potassium chloride SA (K-DUR,KLOR-CON) 20 MEQ tablet Take 1 tablet (20 mEq total) by mouth daily. For 4 days then stop.  4 tablet  0   No current facility-administered medications for this visit.    Past Medical History  Diagnosis Date  . Hyperlipidemia   . Coronary artery disease   . Hypertriglyceridemia   . Dyslipidemia   . Tobacco abuse   . AMI (acute myocardial infarction) 2005  . Diabetes mellitus without complication     Past Surgical History  Procedure Laterality Date  . Lumbar spine surgery  2005  . Coronary stent placement  2005    LAD50/70, CFX OK, PL1 90, PL2 40, RCA 95->0 with 2.5 x 60 mm Taxus stent, EF 60  . Angioplasty    . Coronary angioplasty    . Coronary artery bypass graft N/A 12/31/2012    Procedure:  CORONARY ARTERY BYPASS GRAFTING (CABG);  Surgeon: Loreli Slot, MD;  Location: [facility] OR;  Service: Open Heart Surgery;  Laterality: N/A;  times four using left internal mammary artery, left radial artery, endoscopically harvested right saphenous vein  . Radial artery harvest Left 12/31/2012    Procedure: RADIAL ARTERY HARVEST;  Surgeon: Loreli Slot, MD;  Location: [facility] OR;  Service: Open Heart Surgery;  Laterality: Left;    UJW:JXBJYN of systems complete and found to be negative unless listed above PHYSICAL EXAM BP 94/62  Pulse 81  Ht 5\' 10"  (1.778 m)  Wt 241 lb (109.317 kg)  BMI 34.58 kg/m2  General: Well developed, well nourished, in no acute distress Head: Eyes PERRLA, No xanthomas.   Normal cephalic and atramatic  Lungs: Clear bilaterally to auscultation and percussion. Heart: HRRR S1 S2, without MRG.  Pulses are 2+ & equal.            No carotid bruit. No JVD.   No abdominal bruits. No femoral bruits. Abdomen: Bowel sounds are positive, abdomen soft and non-tender without masses or                  Hernia's noted. Msk:  Back normal, normal gait. Normal strength and tone for age.Well healed sternotomy scar. Well healed left forearm vein harvest site. Extremities: No clubbing, cyanosis or edema.  DP +1 Neuro: Alert and oriented X 3. Psych:  Good affect, responds appropriately  EKG:NSR with rate of 81 bpm.   ASSESSMENT AND PLAN

## 2013-01-26 ENCOUNTER — Other Ambulatory Visit: Payer: Self-pay | Admitting: *Deleted

## 2013-01-27 ENCOUNTER — Ambulatory Visit
Admission: RE | Admit: 2013-01-27 | Discharge: 2013-01-27 | Disposition: A | Discharge status: Home / Self Care | Payer: Medicaid Other | Source: Ambulatory Visit | Attending: Thoracic Surgery (Cardiothoracic Vascular Surgery) | Admitting: Thoracic Surgery (Cardiothoracic Vascular Surgery)

## 2013-01-27 ENCOUNTER — Ambulatory Visit (INDEPENDENT_AMBULATORY_CARE_PROVIDER_SITE_OTHER): Payer: Self-pay | Admitting: Thoracic Surgery (Cardiothoracic Vascular Surgery)

## 2013-01-27 ENCOUNTER — Encounter: Payer: Self-pay | Admitting: Thoracic Surgery (Cardiothoracic Vascular Surgery)

## 2013-01-27 VITALS — BP 121/77 | HR 74 | Resp 16 | Ht 70.0 in | Wt 240.0 lb

## 2013-01-27 DIAGNOSIS — Z951 Presence of aortocoronary bypass graft: Secondary | ICD-10-CM

## 2013-01-27 DIAGNOSIS — I2510 Atherosclerotic heart disease of native coronary artery without angina pectoris: Secondary | ICD-10-CM

## 2013-01-27 NOTE — Progress Notes (Signed)
HPI:  [name] returns today for a scheduled follow up appointment all and coronary bypass grafting on February 19. His postoperative course, but the atrial fibrillation. He converted to sinus rhythm on amiodarone. He had been discharged on 400 mg twice a day, that has been decreased to 200 mg daily by Dr. Eden [name]. His lisinopril also was decreased from 20 mg a day to 10 mg a day.  He says that he has been having some pain. This is primarily sensitivity to light touch as his shirt rubbing on the skin is very uncomfortable. He has chronic back and leg pain for which he is on Percocet. He says he has been taking one half of a 10/325 Percocet tablet about every 4 hours. He currently says he is having a great deal of back pain because he did not take his pain medication prior to coming for his appointment. He has not had any anginal pain or shortness of breath. And has not noted any swelling in his legs.  Past Medical History  Diagnosis Date  . Hyperlipidemia   . Coronary artery disease   . Hypertriglyceridemia   . Dyslipidemia   . Tobacco abuse   . AMI (acute myocardial infarction) 2005  . Diabetes mellitus without complication     Current Outpatient Prescriptions  Medication Sig Dispense Refill  . amiodarone (PACERONE) 200 MG tablet Take 1 tablet (200 mg total) by mouth daily.  30 tablet  6  . aspirin 325 MG tablet Take 325 mg by mouth daily.        . diazepam (VALIUM) 10 MG tablet Take 10 mg by mouth every 6 (six) hours as needed for anxiety.      . fish oil-omega-3 fatty acids 1000 MG capsule Take 1 g by mouth daily.      [name] glucose blood (ACCU-CHEK ACTIVE STRIPS) test strip Use as instructed  100 each  12  . lisinopril (PRINIVIL,ZESTRIL) 10 MG tablet Take 1 tablet (10 mg total) by mouth daily.  30 tablet  6  . metFORMIN (GLUCOPHAGE) 1000 MG tablet Take 1 tablet (1,000 mg total) by mouth 2 (two) times daily with a meal.  60 tablet  1  . metoprolol tartrate (LOPRESSOR) 25 MG tablet Take 25  mg by mouth 2 (two) times daily.      [name] omeprazole (PRILOSEC) 40 MG capsule Take 40 mg by mouth daily.        [name] oxyCODONE-acetaminophen (PERCOCET) 10-325 MG per tablet Take 1 tablet by mouth every 4 (four) hours as needed for pain.      . simvastatin (ZOCOR) 40 MG tablet Take 40 mg by mouth at bedtime.         No current facility-administered medications for this visit.    Physical Exam BP 121/77  Pulse 74  Resp 16  Ht 5\' 10"  (1.778 m)  Wt 240 lb (108.863 kg)  BMI 34.44 kg/m2  SpO2 98%  Diagnostic Tests: CHEST X RAY 01/27/13 Clinical Data: CABG 3 weeks ago, some chest pain and weakness,  follow-up  CHEST - 2 VIEW  Comparison: Portable chest x-ray of 01/02/2013  Findings: There appears be a small pleural effusion remaining  probably on the left. Mild cardiomegaly is stable. No  pneumothorax is seen. Median sternotomy sutures are noted. No  bony abnormality is seen.  IMPRESSION:  Small pleural effusion probably on the left.    Impression: 52 year old gentleman status post coronary bypass grafting x4 with a left mammary to LAD, left radial artery  to the posterior descending and a sequential saphenous vein graft to 2 obtuse marginal branches. He is now about a month out from surgery. Overall he is doing well. He did have postoperative atrial fibrillation which converted to sinus rhythm with amiodarone. His amiodarone this is been decreased to 200 mg daily, and should be able to be stopped altogether in a few weeks. His lisinopril was decreased from 20 mg to 10 mg daily by cardiology. His blood pressure is normal today. His pain medication is being prescribed by Dr. Regino [name] he is requiring more for his chronic back pain and for his incisional pain.  I did recommend that he wait a couple more weeks before driving.  He's not to lift any objects over 10 pounds for at least another 3 weeks.   Plan: I will plan to see him back in 3 weeks to check on his progress

## 2013-02-13 ENCOUNTER — Other Ambulatory Visit: Payer: Self-pay | Admitting: *Deleted

## 2013-02-13 DIAGNOSIS — I2510 Atherosclerotic heart disease of native coronary artery without angina pectoris: Secondary | ICD-10-CM

## 2013-02-17 ENCOUNTER — Ambulatory Visit (INDEPENDENT_AMBULATORY_CARE_PROVIDER_SITE_OTHER): Payer: Self-pay | Admitting: Thoracic Surgery (Cardiothoracic Vascular Surgery)

## 2013-02-17 ENCOUNTER — Ambulatory Visit: Payer: Medicaid Other | Admitting: Thoracic Surgery (Cardiothoracic Vascular Surgery)

## 2013-02-17 ENCOUNTER — Encounter: Payer: Self-pay | Admitting: Thoracic Surgery (Cardiothoracic Vascular Surgery)

## 2013-02-17 ENCOUNTER — Ambulatory Visit
Admission: RE | Admit: 2013-02-17 | Discharge: 2013-02-17 | Disposition: A | Discharge status: Home / Self Care | Payer: Medicaid Other | Source: Ambulatory Visit | Attending: Thoracic Surgery (Cardiothoracic Vascular Surgery) | Admitting: Thoracic Surgery (Cardiothoracic Vascular Surgery)

## 2013-02-17 VITALS — BP 117/79 | HR 77 | Resp 20 | Ht 70.0 in | Wt 240.0 lb

## 2013-02-17 DIAGNOSIS — Z951 Presence of aortocoronary bypass graft: Secondary | ICD-10-CM

## 2013-02-17 DIAGNOSIS — I2510 Atherosclerotic heart disease of native coronary artery without angina pectoris: Secondary | ICD-10-CM

## 2013-02-17 NOTE — Progress Notes (Signed)
HPI:  Mr. Hobby returns today for a second postoperative followup visit. He a coronary bypass grafting about 6 weeks ago. He was last in the office 3 weeks ago which time he was making slow progress, he did have a small left pleural effusion. His lisinopril and and decreased prior to that visit and we decreased his amiodarone 200 mg daily at that visit. Since his last visit he says that he is starting to feel better. He still has some significant pain. He also suffers from chronic back pain which limits his activities. His pain mostly is in the collar bones and sternal clavicular joints bilaterally. He's not had any anginal-type pain. He says that he's been starting to do his walks again. His cutaneous hypersensitivity is improved but not completely resolved.  Past Medical History  Diagnosis Date  . Hyperlipidemia   . Coronary artery disease   . Hypertriglyceridemia   . Dyslipidemia   . Tobacco abuse   . AMI (acute myocardial infarction) 2005  . Diabetes mellitus without complication       Current Outpatient Prescriptions  Medication Sig Dispense Refill  . aspirin 325 MG tablet Take 325 mg by mouth daily.        . diazepam (VALIUM) 10 MG tablet Take 10 mg by mouth every 6 (six) hours as needed for anxiety.      . fish oil-omega-3 fatty acids 1000 MG capsule Take 1 g by mouth daily.      [name] glucose blood (ACCU-CHEK ACTIVE STRIPS) test strip Use as instructed  100 each  12  . lisinopril (PRINIVIL,ZESTRIL) 10 MG tablet Take 1 tablet (10 mg total) by mouth daily.  30 tablet  6  . metFORMIN (GLUCOPHAGE) 1000 MG tablet Take 1 tablet (1,000 mg total) by mouth 2 (two) times daily with a meal.  60 tablet  1  . metoprolol tartrate (LOPRESSOR) 25 MG tablet Take 25 mg by mouth 2 (two) times daily.      [name] oxyCODONE-acetaminophen (PERCOCET) 10-325 MG per tablet Take 1 tablet by mouth every 4 (four) hours as needed for pain.      . simvastatin (ZOCOR) 40 MG tablet Take 40 mg by mouth at bedtime.          No current facility-administered medications for this visit.    Physical Exam BP 117/79  Pulse 77  Resp 20  Ht 5\' 10"  (1.778 m)  Wt 240 lb (108.863 kg)  BMI 34.44 kg/m2  SpO85 42% 52 year old male in no acute distress Neurologic alert and oriented x3 Lungs clear with equal breath was bilaterally Sternum stable, incision clean dry and intact Left arm and leg incisions healing well  Diagnostic Tests: Chest x-ray 02/17/13 shows resolution of the left pleural effusion, no active disease  Impression: Mr. Ottaviano is a 52 year old gentleman status post coronary bypass grafting 6 weeks ago. He is doing well from a cardiac standpoint. His blood pressure is in the normal range on his current dose of lisinopril and metoprolol. I do think at this point we can stop his amiodarone as he is 6 weeks out from surgery and is not had any A. fib since discharge. I instructed him to stop that medication.  He is on restricted in terms of the amount of weight he can lift, but I cautioned him to build into new activities gradually and to avoid activities that cause pain. He may begin driving, appropriate precautions were discussed. He is not to drive on any long trips for a high  speeds for another month.   Plan:  Discontinued amiodarone  He has an appointment with Dr. Eden Emms the near future. I will be happy to see him back time that can be of any further assistance with his care.

## 2013-02-17 NOTE — Patient Instructions (Signed)
You may begin driving  Stop taking amiodarone  Build into activities gradually

## 2013-02-20 ENCOUNTER — Encounter: Payer: Self-pay | Admitting: Cardiovascular Disease

## 2013-02-20 ENCOUNTER — Ambulatory Visit (INDEPENDENT_AMBULATORY_CARE_PROVIDER_SITE_OTHER): Payer: Medicaid Other | Admitting: Cardiovascular Disease

## 2013-02-20 VITALS — BP 118/78 | HR 78 | Ht 70.0 in | Wt 224.1 lb

## 2013-02-20 DIAGNOSIS — I4891 Unspecified atrial fibrillation: Secondary | ICD-10-CM

## 2013-02-20 DIAGNOSIS — I480 Paroxysmal atrial fibrillation: Secondary | ICD-10-CM

## 2013-02-20 DIAGNOSIS — E785 Hyperlipidemia, unspecified: Secondary | ICD-10-CM

## 2013-02-20 DIAGNOSIS — E119 Type 2 diabetes mellitus without complications: Secondary | ICD-10-CM

## 2013-02-20 DIAGNOSIS — I10 Essential (primary) hypertension: Secondary | ICD-10-CM

## 2013-02-20 DIAGNOSIS — I2510 Atherosclerotic heart disease of native coronary artery without angina pectoris: Secondary | ICD-10-CM

## 2013-02-20 MED ORDER — SIMVASTATIN 40 MG PO TABS: 40.0000 mg | ORAL_TABLET | Freq: Every day | ORAL | Status: DC

## 2013-02-20 MED ORDER — METOPROLOL TARTRATE 25 MG PO TABS: 25.0000 mg | ORAL_TABLET | Freq: Two times a day (BID) | ORAL | Status: DC

## 2013-02-20 NOTE — Assessment & Plan Note (Signed)
Discussed low carb diet.  Target hemoglobin A1c is 6.5 or less.  Continue current medications.  

## 2013-02-20 NOTE — Assessment & Plan Note (Signed)
Cholesterol is at goal.  Continue current dose of statin and diet Rx.  No myalgias or side effects.  F/U  LFT's in 6 months. Lab Results  Component Value Date   LDLCALC UNABLE TO CALCULATE IF TRIGLYCERIDE OVER 400 mg/dL 1/61/0960   Labs with primary in 3 months  Continue statin

## 2013-02-20 NOTE — Assessment & Plan Note (Signed)
Periop post CABG Resolved continue ASA and beta blocker

## 2013-02-20 NOTE — Assessment & Plan Note (Signed)
Well controlled.  Continue current medications and low sodium Dash type diet.    

## 2013-02-20 NOTE — Patient Instructions (Addendum)
Your physician recommends that you schedule a follow-up appointment in: 6 MONTHS WITH PN 

## 2013-02-20 NOTE — Assessment & Plan Note (Signed)
Post CABG 12/31/12  See HPI  LIMA/Radial grafts  Wounds healing well PAF resolved and volume status normal  Continue ASA and beta blocker

## 2013-02-20 NOTE — Progress Notes (Signed)
Patient ID: George [name], male   DOB: 02-04-1961, 52 y.o.   MRN: [id number] 52 yo f/u CAD with recent CABG Dr Dorris Fetch 12/31/12  PROCEDURE: Median sternotomy, extracorporeal circulation, coronary  artery bypass grafting x4 (left internal mammary artery to the LAD,  sequential saphenous vein graft to obtuse marginal 1 and 2, left radial  artery to posterior descending), endoscopic vein harvest right  thigh.  ROS: Denies fever, malais, weight loss, blurry vision, decreased visual acuity, cough, sputum, SOB, hemoptysis, pleuritic pain, palpitaitons, heartburn, abdominal pain, melena, lower extremity edema, claudication, or rash.  All other systems reviewed and negative  General: Affect appropriate Healthy:  appears stated age HEENT: normal Neck supple with no adenopathy JVP normal no bruits no thyromegaly Lungs clear with no wheezing and good diaphragmatic motion Heart:  S1/S2 no murmur, no rub, gallop or click PMI normal Abdomen: benighn, BS positve, no tenderness, no AAA no bruit.  No HSM or HJR Distal pulses intact with no bruits No edema Neuro non-focal Skin warm and dry No muscular weakness   Current Outpatient Prescriptions  Medication Sig Dispense Refill  . aspirin 325 MG tablet Take 325 mg by mouth daily.        . diazepam (VALIUM) 10 MG tablet Take 10 mg by mouth every 6 (six) hours as needed for anxiety.      . fish oil-omega-3 fatty acids 1000 MG capsule Take 1 g by mouth daily.      [name] glucose blood (ACCU-CHEK ACTIVE STRIPS) test strip Use as instructed  100 each  12  . lisinopril (PRINIVIL,ZESTRIL) 10 MG tablet Take 1 tablet (10 mg total) by mouth daily.  30 tablet  6  . metFORMIN (GLUCOPHAGE) 1000 MG tablet Take 1 tablet (1,000 mg total) by mouth 2 (two) times daily with a meal.  60 tablet  1  . metoprolol tartrate (LOPRESSOR) 25 MG tablet Take 25 mg by mouth 2 (two) times daily.      [name] oxyCODONE-acetaminophen (PERCOCET) 10-325 MG per tablet Take 1 tablet by mouth every  4 (four) hours as needed for pain.      . simvastatin (ZOCOR) 40 MG tablet Take 40 mg by mouth at bedtime.         No current facility-administered medications for this visit.    Allergies  Review of patient's allergies indicates no known allergies.  Electrocardiogram:   3/10  SR rate 81 QT 444    Assessment and Plan

## 2013-04-22 DIAGNOSIS — Z0271 Encounter for disability determination: Secondary | ICD-10-CM

## 2013-06-11 ENCOUNTER — Telehealth: Payer: Self-pay

## 2013-06-11 NOTE — Telephone Encounter (Signed)
Does pt need OV prior to TCS?

## 2013-06-11 NOTE — Telephone Encounter (Signed)
Gastroenterology Pre-Procedure Review  Request Date: 06/11/2013 Requesting Physician: Dr. Regino Schultze   PATIENT REVIEW QUESTIONS: The patient responded to the following health history questions as indicated:    1. Diabetes Melitis: YES 2. Joint replacements in the past 12 months: no 3. Major health problems in the past 3 months: no 4. Has an artificial valve or MVP: no 5. Has a defibrillator: no 6. Has been advised in past to take antibiotics in advance of a procedure like teeth cleaning: YES HE SAID HE WAS ADVISED TO TAKE ANTIBIOTICS IN 2005 AFTER HE HAD A STENT     MEDICATIONS & ALLERGIES:    Patient reports the following regarding taking any blood thinners:   Plavix? no Aspirin? yes Coumadin? no  Patient confirms/reports the following medications:  Current Outpatient Prescriptions  Medication Sig Dispense Refill  . aspirin 325 MG tablet Take 325 mg by mouth daily.        . diazepam (VALIUM) 10 MG tablet Take 10 mg by mouth every 6 (six) hours as needed for anxiety.      . fish oil-omega-3 fatty acids 1000 MG capsule Take 1,200 mg by mouth daily.       . metFORMIN (GLUCOPHAGE) 1000 MG tablet Take 1 tablet (1,000 mg total) by mouth 2 (two) times daily with a meal.  60 tablet  1  . metoprolol tartrate (LOPRESSOR) 25 MG tablet Take 1 tablet (25 mg total) by mouth 2 (two) times daily.  60 tablet  6  . oxyCODONE-acetaminophen (PERCOCET) 10-325 MG per tablet Take 1 tablet by mouth every 4 (four) hours as needed for pain. Pt said he breaks the pills in half and takes 1/2 about every 3-4 hours      . simvastatin (ZOCOR) 40 MG tablet Take 1 tablet (40 mg total) by mouth at bedtime.  30 tablet  6  . glucose blood (ACCU-CHEK ACTIVE STRIPS) test strip Use as instructed  100 each  12  . lisinopril (PRINIVIL,ZESTRIL) 10 MG tablet Take 1 tablet (10 mg total) by mouth daily.  30 tablet  6   No current facility-administered medications for this visit.    Patient confirms/reports the following  allergies:  No Known Allergies  No orders of the defined types were placed in this encounter.    AUTHORIZATION INFORMATION Primary Insurance:   ID #:   Group #:  Pre-Cert / Auth required:  Pre-Cert / Auth #:   Secondary Insurance:   ID #:  Group #:  Pre-Cert / Auth required: Pre-Cert / Auth #:   SCHEDULE INFORMATION: Procedure has been scheduled as follows:  Date:             Time:   Location:   This Gastroenterology Pre-Precedure Review Form is being routed to the following provider(s): Jonette Eva, MD

## 2013-06-11 NOTE — Telephone Encounter (Signed)
PLEASE CALL PT. THE [employer]  DO NOT RECOMMEND ABX PROPHYLAXIS FOR TCS. MOVIPREP.

## 2013-06-15 ENCOUNTER — Encounter (HOSPITAL_COMMUNITY): Payer: Self-pay | Admitting: Pharmacy Technician

## 2013-06-15 ENCOUNTER — Other Ambulatory Visit: Payer: Self-pay

## 2013-06-15 DIAGNOSIS — Z1211 Encounter for screening for malignant neoplasm of colon: Secondary | ICD-10-CM

## 2013-06-15 MED ORDER — PEG-KCL-NACL-NASULF-NA ASC-C 100 G PO SOLR: 1.0000 | ORAL | Status: DC

## 2013-06-15 NOTE — Addendum Note (Signed)
Addended by: Lavena Bullion on: 06/15/2013 03:10 PM   Modules accepted: Orders

## 2013-06-15 NOTE — Telephone Encounter (Signed)
Called and informed pt. He is scheduled now for 06/29/2013 at 10:45 AM with SF for colonoscopy. Mailing instructions and sending Rx to pharmacy.

## 2013-06-29 ENCOUNTER — Encounter (HOSPITAL_COMMUNITY): Payer: Self-pay | Admitting: *Deleted

## 2013-06-29 ENCOUNTER — Ambulatory Visit (HOSPITAL_COMMUNITY)
Admission: RE | Admit: 2013-06-29 | Discharge: 2013-06-29 | Disposition: A | Discharge status: Home / Self Care | Payer: Medicaid Other | Source: Ambulatory Visit | Attending: Gastroenterology | Admitting: Gastroenterology

## 2013-06-29 ENCOUNTER — Encounter (HOSPITAL_COMMUNITY)
Admission: RE | Disposition: A | Discharge status: Home / Self Care | Payer: Self-pay | Source: Ambulatory Visit | Attending: Gastroenterology

## 2013-06-29 DIAGNOSIS — I252 Old myocardial infarction: Secondary | ICD-10-CM | POA: Insufficient documentation

## 2013-06-29 DIAGNOSIS — E785 Hyperlipidemia, unspecified: Secondary | ICD-10-CM | POA: Insufficient documentation

## 2013-06-29 DIAGNOSIS — E781 Pure hyperglyceridemia: Secondary | ICD-10-CM | POA: Insufficient documentation

## 2013-06-29 DIAGNOSIS — D128 Benign neoplasm of rectum: Secondary | ICD-10-CM | POA: Insufficient documentation

## 2013-06-29 DIAGNOSIS — K621 Rectal polyp: Secondary | ICD-10-CM

## 2013-06-29 DIAGNOSIS — Z7982 Long term (current) use of aspirin: Secondary | ICD-10-CM | POA: Insufficient documentation

## 2013-06-29 DIAGNOSIS — K620 Anal polyp: Secondary | ICD-10-CM

## 2013-06-29 DIAGNOSIS — D126 Benign neoplasm of colon, unspecified: Secondary | ICD-10-CM

## 2013-06-29 DIAGNOSIS — Z87891 Personal history of nicotine dependence: Secondary | ICD-10-CM | POA: Insufficient documentation

## 2013-06-29 DIAGNOSIS — Z1211 Encounter for screening for malignant neoplasm of colon: Secondary | ICD-10-CM | POA: Insufficient documentation

## 2013-06-29 DIAGNOSIS — K648 Other hemorrhoids: Secondary | ICD-10-CM

## 2013-06-29 DIAGNOSIS — I2510 Atherosclerotic heart disease of native coronary artery without angina pectoris: Secondary | ICD-10-CM | POA: Insufficient documentation

## 2013-06-29 DIAGNOSIS — Z951 Presence of aortocoronary bypass graft: Secondary | ICD-10-CM | POA: Insufficient documentation

## 2013-06-29 DIAGNOSIS — Z79899 Other long term (current) drug therapy: Secondary | ICD-10-CM | POA: Insufficient documentation

## 2013-06-29 DIAGNOSIS — E119 Type 2 diabetes mellitus without complications: Secondary | ICD-10-CM | POA: Insufficient documentation

## 2013-06-29 HISTORY — PX: COLONOSCOPY: SHX5424

## 2013-06-29 SURGERY — COLONOSCOPY
Anesthesia: Moderate Sedation

## 2013-06-29 MED ORDER — SODIUM CHLORIDE 0.9 % IJ SOLN
INTRAMUSCULAR | Status: AC
  Filled 2013-06-29: qty 10

## 2013-06-29 MED ORDER — SODIUM CHLORIDE 0.9 % IV SOLN
INTRAVENOUS | Status: DC
  Administered 2013-06-29: 11:00:00 via INTRAVENOUS

## 2013-06-29 MED ORDER — MIDAZOLAM HCL 5 MG/5ML IJ SOLN
INTRAMUSCULAR | Status: DC | PRN
  Administered 2013-06-29 (×4): 2 mg via INTRAVENOUS

## 2013-06-29 MED ORDER — MIDAZOLAM HCL 5 MG/5ML IJ SOLN
INTRAMUSCULAR | Status: AC
  Filled 2013-06-29: qty 10

## 2013-06-29 MED ORDER — STERILE WATER FOR IRRIGATION IR SOLN
Status: DC | PRN
  Administered 2013-06-29: 11:00:00

## 2013-06-29 MED ORDER — MEPERIDINE HCL 100 MG/ML IJ SOLN
INTRAMUSCULAR | Status: AC
  Filled 2013-06-29: qty 1

## 2013-06-29 MED ORDER — PROMETHAZINE HCL 25 MG/ML IJ SOLN
INTRAMUSCULAR | Status: AC
  Filled 2013-06-29: qty 1

## 2013-06-29 MED ORDER — MEPERIDINE HCL 100 MG/ML IJ SOLN
INTRAMUSCULAR | Status: DC | PRN
  Administered 2013-06-29: 25 mg via INTRAVENOUS
  Administered 2013-06-29 (×3): 50 mg via INTRAVENOUS

## 2013-06-29 MED ORDER — PROMETHAZINE HCL 25 MG/ML IJ SOLN
25.0000 mg | Freq: Once | INTRAMUSCULAR | Status: AC
  Administered 2013-06-29: 25 mg via INTRAVENOUS

## 2013-06-29 NOTE — H&P (Signed)
Primary Care Physician:  Kirk Ruths, MD Primary Gastroenterologist:  Dr. Darrick Penna  Pre-Procedure History & Physical: HPI:  George [name] is a 52 y.o. male here for COLON CANCER SCREENING.   Past Medical History  Diagnosis Date  . Hyperlipidemia   . Coronary artery disease   . Hypertriglyceridemia   . Dyslipidemia   . Tobacco abuse   . AMI (acute myocardial infarction) 2005  . Diabetes mellitus without complication     Past Surgical History  Procedure Laterality Date  . Lumbar spine surgery  2005  . Coronary stent placement  2005    LAD50/70, CFX OK, PL1 90, PL2 40, RCA 95->0 with 2.5 x 60 mm Taxus stent, EF 60  . Angioplasty    . Coronary angioplasty    . Coronary artery bypass graft N/A 12/31/2012    Procedure: CORONARY ARTERY BYPASS GRAFTING (CABG);  Surgeon: Loreli Slot, MD;  Location: [facility] OR;  Service: Open Heart Surgery;  Laterality: N/A;  times four using left internal mammary artery, left radial artery, endoscopically harvested right saphenous vein  . Radial artery harvest Left 12/31/2012    Procedure: RADIAL ARTERY HARVEST;  Surgeon: Loreli Slot, MD;  Location: [facility] OR;  Service: Open Heart Surgery;  Laterality: Left;    Prior to Admission medications   Medication Sig Start Date End Date Taking? Authorizing Provider  aspirin 325 MG tablet Take 325 mg by mouth daily.     Yes Historical Provider, MD  diazepam (VALIUM) 10 MG tablet Take 10 mg by mouth every 6 (six) hours as needed for anxiety.   Yes Historical Provider, MD  fish oil-omega-3 fatty acids 1000 MG capsule Take 1,200 mg by mouth daily.    Yes Historical Provider, MD  lisinopril (PRINIVIL,ZESTRIL) 10 MG tablet Take 5 mg by mouth daily. 01/19/13  Yes Jodelle Gross, NP  metFORMIN (GLUCOPHAGE) 1000 MG tablet Take 1 tablet (1,000 mg total) by mouth 2 (two) times daily with a meal. 01/04/13  Yes Donielle Margaretann Loveless, PA-C  metoprolol tartrate (LOPRESSOR) 25 MG tablet Take 1 tablet (25 mg total)  by mouth 2 (two) times daily. 02/20/13  Yes Wendall Stade, MD  oxyCODONE-acetaminophen (PERCOCET) 10-325 MG per tablet Take 1 tablet by mouth every 4 (four) hours as needed for pain. Pt said he breaks the pills in half and takes 1/2 about every 3-4 hours   Yes Historical Provider, MD  peg 3350 powder (MOVIPREP) 100 G SOLR Take 1 kit (200 g total) by mouth as directed. 06/15/13  Yes West Bali, MD  simvastatin (ZOCOR) 40 MG tablet Take 1 tablet (40 mg total) by mouth at bedtime. 02/20/13  Yes Wendall Stade, MD    Allergies as of 06/15/2013  . (No Known Allergies)    Family History  Problem Relation Age of Onset  . Colon cancer Neg Hx     History   Social History  . Marital Status: Married    Spouse Name: N/A    Number of Children: N/A  . Years of Education: N/A   Occupational History  . Disabled    Social History Main Topics  . Smoking status: Former Smoker -- 2.00 packs/day for 10 years    Types: Cigars  . Smokeless tobacco: Not on file  . Alcohol Use: Yes     Comment: Occasional  . Drug Use: Yes    Special: Marijuana     Comment: Last time-02/2013  . Sexual Activity: Not on file   Other  Topics Concern  . Not on file   Social History Narrative   Married   Sedentary    Review of Systems: See HPI, otherwise negative ROS   Physical Exam: BP 148/95  Pulse 77  Temp(Src) 97.9 F (36.6 C) (Oral)  Resp 22  Ht 5\' 10"  (1.778 m)  Wt 225 lb (102.059 kg)  BMI 32.28 kg/m2  SpO2 98% General:   Alert,  pleasant and cooperative in NAD Head:  Normocephalic and atraumatic. Neck:  Supple; Lungs:  Clear throughout to auscultation.    Heart:  Regular rate and rhythm. Abdomen:  Soft, nontender and nondistended. Normal bowel sounds, without guarding, and without rebound.   Neurologic:  Alert and  oriented x4;  grossly normal neurologically.  Impression/Plan:     SCREENING  Plan:  1. TCS TODAY

## 2013-06-29 NOTE — Op Note (Addendum)
[facility] [address] [place], [address]   COLONOSCOPY PROCEDURE REPORT  [name] George [name], George [name]  MR#: [id number] BIRTHDATE: 1961-07-12 , 52  yrs. old GENDER: Male ENDOSCOPIST: Jonette Eva, MD REFERRED [id number] Regino Schultze, M.D. PROCEDURE DATE:  06/29/2013 PROCEDURE:   ILEOColonoscopy with snare polypectomy and Colonoscopy with cold biopsy polypectomy INDICATIONS:Average risk patient for colon cancer. MEDICATIONS: Promethazine (Phenergan) 25mg  IV, Demerol, Demerol-Detailed 175 mg IV, and Versed 8 mg IV  DESCRIPTION OF PROCEDURE:    Physical exam was performed.  Informed consent was obtained from the patient after explaining the benefits, risks, and alternatives to procedure.  The patient was connected to monitor and placed in left lateral position. Continuous oxygen was provided by nasal cannula and IV medicine administered through an indwelling cannula.  After administration of sedation and rectal exam, the patients rectum was intubated and the EC-[id number] ([id number])  colonoscope was advanced under direct visualization to the ileum.  The scope was removed slowly by carefully examining the color, texture, anatomy, and integrity mucosa on the way out.  The patient was recovered in endoscopy and discharged home in satisfactory condition.    COLON FINDINGS: Two polyps measuring 4-6 mm in size were found in the transverse colon and sigmoid colon.  A polypectomy was performed with cold forceps and using snare cautery.  , A sessile polyp measuring 1.1 cm in size was found in the rectum.  A polypectomy was performed using snare cautery.  , and The mucosa appeared normal in the terminal ileum.  PREP QUALITY: good.  CECAL W/D TIME: 22 minutes     COMPLICATIONS: None  ENDOSCOPIC IMPRESSION: 1.   THREE COLORECTALPOLYPS REMOVED 2.   SMALL INTERNAL HEMORRHOIDS  RECOMMENDATIONS: CONTINUE WEIGHT LOSS EFFORTS. AVOID SWEET TEA, SODAS, KOOL-AID, OR GREEN TEA. DRINK  WATER TO KEEP YOUR URINE LIGHT YELLOW. FOLLOW A LOW FAT/HIGH FIBER DIET.  AVOID ITEMS THAT CAUSE BLOATING.  USE PREPARATION H AS NEEDED FOR RECTAL PAIN, PRESSURE, ITCHING, OR BLEEDING.  Next colonoscopy in 3 years DUE TO POLYP > 1 CM REMOVED.  ALL SISTERS, BROTHERS, CHILDREN, AND PARENTS NEED TO HAVE A COLONOSCOPY STARTING AT THE AGE OF 40.       _______________________________ Rosalie DoctorJonette Eva, MD 06/29/2013 1:42 PM     PATIENT NAME:  George George [name], George [name] MR#: [id number]

## 2013-06-30 ENCOUNTER — Encounter (HOSPITAL_COMMUNITY): Payer: Self-pay | Admitting: Gastroenterology

## 2013-07-06 ENCOUNTER — Telehealth: Payer: Self-pay | Admitting: Gastroenterology

## 2013-07-06 NOTE — Telephone Encounter (Signed)
Please call pt. HE had simple adenomas removed from HIS colon.   CONTINUE YOUR WEIGHT LOSS EFFORTS.  DO NOT DRINK SWEET TEA, SODAS, KOOL-AID, OR GREEN TEA.   DRINK WATER TO KEEP YOUR URINE LIGHT YELLOW. FOLLOW A LOW FAT/HIGH FIBER DIET. AVOID ITEMS THAT CAUSE BLOATING.  USE PREPARATION H AS NEEDED FOR RECTAL PAIN, PRESSURE, ITCHING, OR BLEEDING  Next colonoscopy in 3 years. YOUR SISTERS, BROTHERS, CHILDREN, AND PARENTS NEED TO HAVE A COLONOSCOPY STARTING AT THE AGE OF 40.

## 2013-07-07 ENCOUNTER — Telehealth: Payer: Self-pay | Admitting: *Deleted

## 2013-07-07 NOTE — Telephone Encounter (Signed)
Called and informed pt.  

## 2013-07-07 NOTE — Telephone Encounter (Signed)
LMOM for a return call.  

## 2013-07-07 NOTE — Telephone Encounter (Signed)
Pt called, stating he is returning a call to the nurse. Please advise [phone]

## 2013-07-07 NOTE — Telephone Encounter (Signed)
See result note of 07/06/2013. Pt aware of results.

## 2013-07-07 NOTE — Telephone Encounter (Signed)
Pt on recall list for 3 year TCS 

## 2013-09-01 ENCOUNTER — Ambulatory Visit (INDEPENDENT_AMBULATORY_CARE_PROVIDER_SITE_OTHER): Payer: Medicaid Other | Admitting: Adult Health

## 2013-09-01 ENCOUNTER — Encounter: Payer: Self-pay | Admitting: Adult Health

## 2013-09-01 VITALS — BP 134/80 | HR 70 | Ht 70.0 in | Wt 235.0 lb

## 2013-09-01 DIAGNOSIS — F17200 Nicotine dependence, unspecified, uncomplicated: Secondary | ICD-10-CM

## 2013-09-01 DIAGNOSIS — I2510 Atherosclerotic heart disease of native coronary artery without angina pectoris: Secondary | ICD-10-CM

## 2013-09-01 DIAGNOSIS — I10 Essential (primary) hypertension: Secondary | ICD-10-CM

## 2013-09-01 DIAGNOSIS — E785 Hyperlipidemia, unspecified: Secondary | ICD-10-CM

## 2013-09-01 NOTE — Patient Instructions (Signed)
Your physician recommends that you schedule a follow-up appointment in: 6 months with Dr Eden Emms  Your physician recommends that you continue on your current medications as directed. Please refer to the Current Medication list given to you today.

## 2013-09-01 NOTE — Assessment & Plan Note (Signed)
He continues on statin therapy, and is followed by Dr. Regino Schultze with recent labs within the last month. He is advised on a low cholesterol diet and to increase exercise as tolerated.

## 2013-09-01 NOTE — Progress Notes (Signed)
HPI: [name] is a 52 year old patient of Dr. Charlton Haws we are following for known history of coronary artery disease, status post coronary artery bypass grafting in February 2014, with LIMA to LAD, sequential saphenous vein graft to marginal 1 and 2, and left radial artery to posterior descending. Other history includes hypertension, diabetes, and hyperlipidemia. He was last seen in the office in April 2014, was very well compensated, without complaint. Medical management continued.   He comes today with complaints of generalized fatigue. He states that he thought that he would be feeling much stronger by now. He has been advised in the past that he will take at least a year to feel more like himself. He continues to be medically compliant, but not very active due to chronic back pain. He walks with a cane. He is due to have some teeth extractions in the next month, and is concerned that his surgery may prohibit that.  No Known Allergies  Current Outpatient Prescriptions  Medication Sig Dispense Refill  . aspirin 325 MG tablet Take 325 mg by mouth daily.        . diazepam (VALIUM) 10 MG tablet Take 10 mg by mouth every 6 (six) hours as needed for anxiety.      . fish oil-omega-3 fatty acids 1000 MG capsule Take 1 g by mouth daily.       [name] lisinopril (PRINIVIL,ZESTRIL) 10 MG tablet Take 5 mg by mouth daily.      . metFORMIN (GLUCOPHAGE) 1000 MG tablet Take 1 tablet (1,000 mg total) by mouth 2 (two) times daily with a meal.  60 tablet  1  . metoprolol tartrate (LOPRESSOR) 25 MG tablet Take 1 tablet (25 mg total) by mouth 2 (two) times daily.  60 tablet  6  . oxyCODONE-acetaminophen (PERCOCET) 10-325 MG per tablet Take 1 tablet by mouth every 4 (four) hours as needed for pain. Pt said he breaks the pills in half and takes 1/2 about every 3-4 hours      . simvastatin (ZOCOR) 40 MG tablet Take 1 tablet (40 mg total) by mouth at bedtime.  30 tablet  6   No current facility-administered  medications for this visit.    Past Medical History  Diagnosis Date  . Hyperlipidemia   . Coronary artery disease   . Hypertriglyceridemia   . Dyslipidemia   . Tobacco abuse   . AMI (acute myocardial infarction) 2005  . Diabetes mellitus without complication     Past Surgical History  Procedure Laterality Date  . Lumbar spine surgery  2005  . Coronary stent placement  2005    LAD50/70, CFX OK, PL1 90, PL2 40, RCA 95->0 with 2.5 x 60 mm Taxus stent, EF 60  . Angioplasty    . Coronary angioplasty    . Coronary artery bypass graft N/A 12/31/2012    Procedure: CORONARY ARTERY BYPASS GRAFTING (CABG);  Surgeon: Loreli Slot, MD;  Location: [facility] OR;  Service: Open Heart Surgery;  Laterality: N/A;  times four using left internal mammary artery, left radial artery, endoscopically harvested right saphenous vein  . Radial artery harvest Left 12/31/2012    Procedure: RADIAL ARTERY HARVEST;  Surgeon: Loreli Slot, MD;  Location: [facility] OR;  Service: Open Heart Surgery;  Laterality: Left;  . Colonoscopy N/A 06/29/2013    Procedure: COLONOSCOPY;  Surgeon: West Bali, MD;  [facility]: [facility];  Service: Endoscopy;  Laterality: N/A;  10:45 AM    ZOX:WRUEAV of systems  complete and found to be negative unless listed above  PHYSICAL EXAM BP 134/80  Pulse 70  Ht 5\' 10"  (1.778 m)  Wt 235 lb (106.595 kg)  BMI 33.72 kg/m2  General: Well developed, well nourished, in no acute distress Head: Eyes PERRLA, Positive xanthomas.   Normal cephalic and atramatic  Lungs: Clear bilaterally to auscultation and percussion. Heart: HRRR S1 S2, without MRG.  Pulses are 2+ & equal.            No carotid bruit. No JVD.  No abdominal bruits. No femoral bruits. Abdomen: Bowel sounds are positive, abdomen soft and non-tender without masses or                  Hernia's noted. Msk:  Back normal, normal gait. Normal strength and tone for age. Extremities: No clubbing, cyanosis or edema.  DP +1 Neuro:  Alert and oriented X 3. Psych:  Good affect, responds appropriately    ASSESSMENT AND PLAN

## 2013-09-01 NOTE — Assessment & Plan Note (Signed)
Blood pressure is currently very well-controlled. We will not make any changes in his medication regimen. Medical management and risk modification will continue. We will see him again in 6 months unless he is symptomatic.

## 2013-09-01 NOTE — Assessment & Plan Note (Signed)
The patient has stopped smoking with rare cigarette use for which he may smoke part of the cigarette every few months. He is advised to stop smoking entirely.

## 2013-09-01 NOTE — Assessment & Plan Note (Signed)
He is doing well from a cardiac standpoint essentially. He continues to have generalized fatigue. He states that he should be feeling more like himself, and is impatient with the process of healing. He continues to have some incisional pain substernally, especially with deep breaths and upper body movement. I have advised him that this is normal and part of the healing process. Surgery was approximately 6 months ago, and he will need to give it at least a year in order to begin to feel better. I would not make any change in his medication regimen at this time.  Concerning teeth extraction, I do not feel the need to place him on any prophylactic antibiotics at this time. He does not have any mechanical valves or history of myocarditis.

## 2013-10-15 ENCOUNTER — Other Ambulatory Visit: Payer: Self-pay

## 2013-10-15 DIAGNOSIS — I480 Paroxysmal atrial fibrillation: Secondary | ICD-10-CM

## 2013-10-15 DIAGNOSIS — E785 Hyperlipidemia, unspecified: Secondary | ICD-10-CM

## 2013-10-15 DIAGNOSIS — E119 Type 2 diabetes mellitus without complications: Secondary | ICD-10-CM

## 2013-10-15 DIAGNOSIS — I10 Essential (primary) hypertension: Secondary | ICD-10-CM

## 2013-10-15 DIAGNOSIS — I2510 Atherosclerotic heart disease of native coronary artery without angina pectoris: Secondary | ICD-10-CM

## 2013-10-15 MED ORDER — SIMVASTATIN 40 MG PO TABS: 40.0000 mg | ORAL_TABLET | Freq: Every day | ORAL | Status: DC

## 2013-10-16 ENCOUNTER — Other Ambulatory Visit: Payer: Self-pay

## 2013-10-16 DIAGNOSIS — I10 Essential (primary) hypertension: Secondary | ICD-10-CM

## 2013-10-16 DIAGNOSIS — E119 Type 2 diabetes mellitus without complications: Secondary | ICD-10-CM

## 2013-10-16 DIAGNOSIS — I480 Paroxysmal atrial fibrillation: Secondary | ICD-10-CM

## 2013-10-16 DIAGNOSIS — I2510 Atherosclerotic heart disease of native coronary artery without angina pectoris: Secondary | ICD-10-CM

## 2013-10-16 DIAGNOSIS — E785 Hyperlipidemia, unspecified: Secondary | ICD-10-CM

## 2013-10-16 MED ORDER — METOPROLOL TARTRATE 25 MG PO TABS: 25.0000 mg | ORAL_TABLET | Freq: Two times a day (BID) | ORAL | Status: DC

## 2014-01-14 ENCOUNTER — Ambulatory Visit (INDEPENDENT_AMBULATORY_CARE_PROVIDER_SITE_OTHER): Payer: Medicaid Other | Admitting: Adult Health

## 2014-01-14 ENCOUNTER — Encounter: Payer: Self-pay | Admitting: *Deleted

## 2014-01-14 ENCOUNTER — Encounter: Payer: Self-pay | Admitting: Adult Health

## 2014-01-14 VITALS — BP 146/87 | HR 78 | Ht 70.0 in | Wt 239.0 lb

## 2014-01-14 DIAGNOSIS — R0989 Other specified symptoms and signs involving the circulatory and respiratory systems: Secondary | ICD-10-CM

## 2014-01-14 DIAGNOSIS — I10 Essential (primary) hypertension: Secondary | ICD-10-CM

## 2014-01-14 DIAGNOSIS — R0600 Dyspnea, unspecified: Secondary | ICD-10-CM

## 2014-01-14 DIAGNOSIS — I2510 Atherosclerotic heart disease of native coronary artery without angina pectoris: Secondary | ICD-10-CM

## 2014-01-14 DIAGNOSIS — R0609 Other forms of dyspnea: Secondary | ICD-10-CM

## 2014-01-14 NOTE — Patient Instructions (Signed)
Your physician recommends that you schedule a follow-up appointment in: 1 month with Dr Bronson Ing   Your physician has requested that you have a stress echocardiogram. For further information please visit [email or web address]. Please follow instruction sheet as given.

## 2014-01-14 NOTE — Assessment & Plan Note (Signed)
He requests another stress test due to recurrent chest discomfort and DOE. I will plan for a stress echo. He is not to take metoprolol the night before or the morning of the test. He will follow up with Dr. Bronson Ing

## 2014-01-14 NOTE — Progress Notes (Signed)
HPI: [name] is a 53 year old patient of Dr. Jenkins Rouge we are following for ongoing assessment and management of CAD, status post CABG, February 2014; with LIMA to LAD, sequential saphenous vein graft to marginal 1 and 2, and left radial artery to posterior descending. Other history includes hypertension, diabetes, and hyperlipidemia. He was last seen in the office in October 2014.  At that time he complained of generalized fatigue, stating he felt that since the surgery he should have been feeling stronger by then. He continued to be medically compliant. Reassurance was given to the patient at the time and medications were continued. Blood pressure was well controlled. He was encouraged to stop smoking.  He comes today with complaints of worsening DOE, chest pressure. He has gained 15 lbs. He states he cannot walk down his driveway without becoming short of breath. He is more tired than usual.  No Known Allergies  Current Outpatient Prescriptions  Medication Sig Dispense Refill  . aspirin 325 MG tablet Take 325 mg by mouth daily.        . fish oil-omega-3 fatty acids 1000 MG capsule Take 1 g by mouth daily.       [name] lisinopril (PRINIVIL,ZESTRIL) 20 MG tablet Take 20 mg by mouth daily.      . metFORMIN (GLUCOPHAGE) 1000 MG tablet Take 1 tablet (1,000 mg total) by mouth 2 (two) times daily with a meal.  60 tablet  1  . metoprolol tartrate (LOPRESSOR) 25 MG tablet Take 1 tablet (25 mg total) by mouth 2 (two) times daily.  60 tablet  2  . simvastatin (ZOCOR) 40 MG tablet Take 1 tablet (40 mg total) by mouth at bedtime.  30 tablet  4  . diazepam (VALIUM) 10 MG tablet Take 10 mg by mouth every 6 (six) hours as needed for anxiety.      [name] oxyCODONE-acetaminophen (PERCOCET) 10-325 MG per tablet Take 1 tablet by mouth every 4 (four) hours as needed for pain. Pt said he breaks the pills in half and takes 1/2 about every 3-4 hours       No current facility-administered medications for this visit.      Past Medical History  Diagnosis Date  . Hyperlipidemia   . Coronary artery disease   . Hypertriglyceridemia   . Dyslipidemia   . Tobacco abuse   . AMI (acute myocardial infarction) 2005  . Diabetes mellitus without complication     Past Surgical History  Procedure Laterality Date  . Lumbar spine surgery  2005  . Coronary stent placement  2005    LAD50/70, CFX OK, PL1 90, PL2 40, RCA 95->0 with 2.5 x 60 mm Taxus stent, EF 60  . Angioplasty    . Coronary angioplasty    . Coronary artery bypass graft N/A 12/31/2012    Procedure: CORONARY ARTERY BYPASS GRAFTING (CABG);  Surgeon: Melrose Nakayama, MD;  Location: [facility];  Service: Open Heart Surgery;  Laterality: N/A;  times four using left internal mammary artery, left radial artery, endoscopically harvested right saphenous vein  . Radial artery harvest Left 12/31/2012    Procedure: RADIAL ARTERY HARVEST;  Surgeon: Melrose Nakayama, MD;  Location: [facility];  Service: Open Heart Surgery;  Laterality: Left;  . Colonoscopy N/A 06/29/2013    Procedure: COLONOSCOPY;  Surgeon: Danie Binder, MD;  [facility];  Service: Endoscopy;  Laterality: N/A;  10:45 AM    WGN:FAOZHY of systems complete and found to be negative unless listed  above  PHYSICAL EXAM BP 146/87  Pulse 78  Ht 5\' 10"  (1.778 m)  Wt 239 lb (108.41 kg)  BMI 34.29 kg/m2  SpO2 99%  General: Well developed, well nourished, in no acute distress Head: Eyes PERRLA, No xanthomas.   Normal cephalic and atramatic  Lungs: Clear bilaterally to auscultation and percussion. Heart: HRRR S1 S2, without MRG.  Pulses are 2+ & equal.            No carotid bruit. No JVD.  No abdominal bruits. No femoral bruits. Abdomen: Bowel sounds are positive, abdomen soft, obese, and non-tender without masses or                  Hernia's noted. Msk:  Back normal, normal gait. Normal strength and tone for age. Extremities: No clubbing, cyanosis or edema.  DP +1 Neuro: Alert and  oriented X 3. Psych:  Good affect, responds appropriately  :  ASSESSMENT AND PLAN

## 2014-01-14 NOTE — Progress Notes (Deleted)
Name: George [name]    DOB: 1960/11/19  Age: 53 y.o.  MR#: [id number]       PCP:  Leonides Grills, MD      Insurance: Payor: MEDICAID [place] / Plan: MEDICAID OF [place] / Product Type: *No Product type* /   CC:    Chief Complaint  Patient presents with  . Coronary Artery Disease  . Hypertension    VS Filed Vitals:   01/14/14 1351  BP: 146/87  Pulse: 78  Height: 5\' 10"  (1.778 m)  Weight: 239 lb (108.41 kg)  SpO2: 99%    Weights Current Weight  01/14/14 239 lb (108.41 kg)  09/01/13 235 lb (106.595 kg)  06/29/13 225 lb (102.059 kg)    Blood Pressure  BP Readings from Last 3 Encounters:  01/14/14 146/87  09/01/13 134/80  06/29/13 124/86     Admit date:  (Not on file) Last encounter with RMR:  09/01/2013   Allergy Review of patient's allergies indicates no known allergies.  Current Outpatient Prescriptions  Medication Sig Dispense Refill  . aspirin 325 MG tablet Take 325 mg by mouth daily.        . fish oil-omega-3 fatty acids 1000 MG capsule Take 1 g by mouth daily.       [name] lisinopril (PRINIVIL,ZESTRIL) 20 MG tablet Take 20 mg by mouth daily.      . metFORMIN (GLUCOPHAGE) 1000 MG tablet Take 1 tablet (1,000 mg total) by mouth 2 (two) times daily with a meal.  60 tablet  1  . metoprolol tartrate (LOPRESSOR) 25 MG tablet Take 1 tablet (25 mg total) by mouth 2 (two) times daily.  60 tablet  2  . simvastatin (ZOCOR) 40 MG tablet Take 1 tablet (40 mg total) by mouth at bedtime.  30 tablet  4  . diazepam (VALIUM) 10 MG tablet Take 10 mg by mouth every 6 (six) hours as needed for anxiety.      [name] oxyCODONE-acetaminophen (PERCOCET) 10-325 MG per tablet Take 1 tablet by mouth every 4 (four) hours as needed for pain. Pt said he breaks the pills in half and takes 1/2 about every 3-4 hours       No current facility-administered medications for this visit.    Discontinued Meds:    Medications Discontinued During This Encounter  Medication Reason  . lisinopril (PRINIVIL,ZESTRIL) 10 MG  tablet Error    Patient Active Problem List   Diagnosis Date Noted  . PAF (paroxysmal atrial fibrillation) 01/03/2013  . Diabetes mellitus 12/29/2012  . ESSENTIAL HYPERTENSION, BENIGN 02/17/2010  . HYPERTRIGLYCERIDEMIA 02/13/2010  . Other and unspecified hyperlipidemia 02/13/2010  . TOBACCO ABUSE 02/13/2010  . AMI 02/13/2010  . CAD 02/13/2010    LABS    Component Value Date/Time   NA 138 01/03/2013 0625   NA 136 01/02/2013 0338   NA 138 01/01/2013 1723   K 4.2 01/03/2013 0625   K 4.5 01/02/2013 0338   K 4.6 01/01/2013 1723   CL 101 01/03/2013 0625   CL 101 01/02/2013 0338   CL 104 01/01/2013 1723   CO2 23 01/03/2013 0625   CO2 26 01/02/2013 0338   CO2 25 01/01/2013 0350   GLUCOSE 180* 01/03/2013 0625   GLUCOSE 225* 01/02/2013 0338   GLUCOSE 177* 01/01/2013 1723   BUN 17 01/03/2013 0625   BUN 17 01/02/2013 0338   BUN 12 01/01/2013 1723   CREATININE 0.78 01/03/2013 0625   CREATININE 1.00 01/02/2013 0338   CREATININE 1.00 01/01/2013 1723   CALCIUM  9.4 01/03/2013 0625   CALCIUM 8.8 01/02/2013 0338   CALCIUM 8.5 01/01/2013 0350   GFRNONAA >90 01/03/2013 0625   GFRNONAA 86* 01/02/2013 0338   GFRNONAA >90 01/01/2013 1700   GFRAA >90 01/03/2013 0625   GFRAA >90 01/02/2013 0338   GFRAA >90 01/01/2013 1700   CMP     Component Value Date/Time   NA 138 01/03/2013 0625   K 4.2 01/03/2013 0625   CL 101 01/03/2013 0625   CO2 23 01/03/2013 0625   GLUCOSE 180* 01/03/2013 0625   BUN 17 01/03/2013 0625   CREATININE 0.78 01/03/2013 0625   CALCIUM 9.4 01/03/2013 0625   PROT 7.1 12/31/2012 0440   ALBUMIN 3.7 12/31/2012 0440   AST 38* 12/31/2012 0440   ALT 65* 12/31/2012 0440   ALKPHOS 83 12/31/2012 0440   BILITOT 0.6 12/31/2012 0440   GFRNONAA >90 01/03/2013 0625   GFRAA >90 01/03/2013 0625       Component Value Date/Time   WBC 12.3* 01/03/2013 0625   WBC 13.6* 01/02/2013 0338   WBC 14.8* 01/01/2013 1700   HGB 11.9* 01/03/2013 0625   HGB 12.0* 01/02/2013 0338   HGB 12.2* 01/01/2013 1723   HCT 35.1* 01/03/2013  0625   HCT 34.6* 01/02/2013 0338   HCT 36.0* 01/01/2013 1723   MCV 91.2 01/03/2013 0625   MCV 90.8 01/02/2013 0338   MCV 91.5 01/01/2013 1700    Lipid Panel     Component Value Date/Time   CHOL 186 12/30/2012 0500   TRIG 601* 12/30/2012 0500   HDL 34* 12/30/2012 0500   CHOLHDL 5.5 12/30/2012 0500   VLDL UNABLE TO CALCULATE IF TRIGLYCERIDE OVER 400 mg/dL 12/30/2012 0500   LDLCALC UNABLE TO CALCULATE IF TRIGLYCERIDE OVER 400 mg/dL 12/30/2012 0500    ABG    Component Value Date/Time   PHART 7.336* 12/31/2012 1901   PCO2ART 38.9 12/31/2012 1901   PO2ART 72.0* 12/31/2012 1901   HCO3 20.8 12/31/2012 1901   TCO2 24 01/01/2013 1723   ACIDBASEDEF 5.0* 12/31/2012 1901   O2SAT 93.0 12/31/2012 1901     No results found for this basename: TSH   BNP (last 3 results) No results found for this basename: PROBNP,  in the last 8760 hours Cardiac Panel (last 3 results) No results found for this basename: CKTOTAL, CKMB, TROPONINI, RELINDX,  in the last 72 hours  Iron/TIBC/Ferritin No results found for this basename: iron, tibc, ferritin     EKG Orders placed in visit on 01/19/13  . EKG 12-LEAD     Prior Assessment and Plan Problem List as of 01/14/2014     Cardiovascular and Mediastinum   ESSENTIAL HYPERTENSION, BENIGN   Last Assessment & Plan   09/01/2013 Office Visit Written 09/01/2013  5:30 PM by Lendon Colonel, NP     Blood pressure is currently very well-controlled. We will not make any changes in his medication regimen. Medical management and risk modification will continue. We will see him again in 6 months unless he is symptomatic.    AMI   CAD   Last Assessment & Plan   09/01/2013 Office Visit Written 09/01/2013  5:30 PM by Lendon Colonel, NP     He is doing well from a cardiac standpoint essentially. He continues to have generalized fatigue. He states that he should be feeling more like himself, and is impatient with the process of healing. He continues to have some incisional pain  substernally, especially with deep breaths and upper body movement. I have advised  him that this is normal and part of the healing process. Surgery was approximately 6 months ago, and he will need to give it at least a year in order to begin to feel better. I would not make any change in his medication regimen at this time.  Concerning teeth extraction, I do not feel the need to place him on any prophylactic antibiotics at this time. He does not have any mechanical valves or history of myocarditis.    PAF (paroxysmal atrial fibrillation)   Last Assessment & Plan   02/20/2013 Office Visit Written 02/20/2013 10:23 AM by Josue Hector, MD     Periop post CABG Resolved continue ASA and beta blocker      Endocrine   Diabetes mellitus   Last Assessment & Plan   02/20/2013 Office Visit Written 02/20/2013 10:24 AM by Josue Hector, MD     Discussed low carb diet.  Target hemoglobin A1c is 6.5 or less.  Continue current medications.       Other   HYPERTRIGLYCERIDEMIA   Other and unspecified hyperlipidemia   Last Assessment & Plan   09/01/2013 Office Visit Written 09/01/2013  5:31 PM by Lendon Colonel, NP     He continues on statin therapy, and is followed by Dr. Orson Ape with recent labs within the last month. He is advised on a low cholesterol diet and to increase exercise as tolerated.    TOBACCO ABUSE   Last Assessment & Plan   09/01/2013 Office Visit Written 09/01/2013  5:32 PM by Lendon Colonel, NP     The patient has stopped smoking with rare cigarette use for which he may smoke part of the cigarette every few months. He is advised to stop smoking entirely.        Imaging: No results found.

## 2014-01-14 NOTE — Assessment & Plan Note (Signed)
Blood pressure is well controlled currently. He will continue his current medications.

## 2014-01-18 NOTE — Addendum Note (Signed)
Addended by: Truett Mainland on: 01/18/2014 12:53 PM   Modules accepted: Orders

## 2014-01-20 ENCOUNTER — Other Ambulatory Visit: Payer: Self-pay

## 2014-01-20 DIAGNOSIS — I480 Paroxysmal atrial fibrillation: Secondary | ICD-10-CM

## 2014-01-20 DIAGNOSIS — E119 Type 2 diabetes mellitus without complications: Secondary | ICD-10-CM

## 2014-01-20 DIAGNOSIS — I10 Essential (primary) hypertension: Secondary | ICD-10-CM

## 2014-01-20 DIAGNOSIS — I2510 Atherosclerotic heart disease of native coronary artery without angina pectoris: Secondary | ICD-10-CM

## 2014-01-20 DIAGNOSIS — E785 Hyperlipidemia, unspecified: Secondary | ICD-10-CM

## 2014-01-20 MED ORDER — METOPROLOL TARTRATE 25 MG PO TABS: 25.0000 mg | ORAL_TABLET | Freq: Two times a day (BID) | ORAL | Status: DC

## 2014-01-22 ENCOUNTER — Encounter (HOSPITAL_COMMUNITY): Payer: Self-pay

## 2014-01-22 ENCOUNTER — Ambulatory Visit (HOSPITAL_COMMUNITY)
Admission: RE | Admit: 2014-01-22 | Discharge: 2014-01-22 | Disposition: A | Discharge status: Home / Self Care | Payer: Medicaid Other | Source: Ambulatory Visit | Attending: Adult Health | Admitting: Adult Health

## 2014-01-22 DIAGNOSIS — I2510 Atherosclerotic heart disease of native coronary artery without angina pectoris: Secondary | ICD-10-CM

## 2014-01-22 DIAGNOSIS — R0609 Other forms of dyspnea: Secondary | ICD-10-CM

## 2014-01-22 DIAGNOSIS — I4891 Unspecified atrial fibrillation: Secondary | ICD-10-CM | POA: Insufficient documentation

## 2014-01-22 DIAGNOSIS — R0989 Other specified symptoms and signs involving the circulatory and respiratory systems: Secondary | ICD-10-CM | POA: Insufficient documentation

## 2014-01-22 DIAGNOSIS — I10 Essential (primary) hypertension: Secondary | ICD-10-CM | POA: Insufficient documentation

## 2014-01-22 DIAGNOSIS — Z87891 Personal history of nicotine dependence: Secondary | ICD-10-CM | POA: Insufficient documentation

## 2014-01-22 DIAGNOSIS — R0600 Dyspnea, unspecified: Secondary | ICD-10-CM

## 2014-01-22 DIAGNOSIS — R9439 Abnormal result of other cardiovascular function study: Secondary | ICD-10-CM | POA: Insufficient documentation

## 2014-01-22 DIAGNOSIS — E785 Hyperlipidemia, unspecified: Secondary | ICD-10-CM | POA: Insufficient documentation

## 2014-01-22 DIAGNOSIS — E119 Type 2 diabetes mellitus without complications: Secondary | ICD-10-CM | POA: Insufficient documentation

## 2014-01-22 NOTE — Progress Notes (Signed)
Stress Lab Nurses Notes - George [name]  George [name] 01/22/2014 Reason for doing test: CAD and Dyspnea Type of test: Stress Echo Nurse performing test: Gerrit Halls, RN Nuclear Medicine Tech: Not Applicable Echo Tech: Jamison Neighbor MD performing test: Branch/K.Lawrence NP Family MD: McGough Test explained and consent signed: yes IV started: No IV started Symptoms: Chest pain & dizziness Treatment/Intervention: None Reason test stopped: fatigue and dizziness After recovery IV was: [name] Patient to return to [place]. Med at : [name] Patient discharged: Home Patient's Condition upon discharge was: stable Comments: During test peak BP 186/90 & HR 134.  Recovery BP 155/89 & HR 78.  Symptoms resolved in recovery.  Geanie Cooley T

## 2014-01-22 NOTE — Progress Notes (Signed)
*  PRELIMINARY RESULTS* Echocardiogram Echocardiogram Stress Test has been performed.  [place], [place] 01/22/2014, 12:04 PM

## 2014-02-04 ENCOUNTER — Telehealth: Payer: Self-pay | Admitting: *Deleted

## 2014-02-04 NOTE — Telephone Encounter (Signed)
Pt has sgtress test done a couple weeks ago and still has not got results

## 2014-02-04 NOTE — Telephone Encounter (Signed)
Stress test was found to be abnormal from EKG perspective. He was to have sooner appt made instead of April appt with [facility]. Please make sure he has one scheduled for [facility] soon.

## 2014-02-04 NOTE — Telephone Encounter (Signed)
Will investigate

## 2014-02-05 NOTE — Telephone Encounter (Signed)
Pt has apt scheduled for 02/10/14 1120 am [place] office with Dr.Koneswaran

## 2014-02-10 ENCOUNTER — Ambulatory Visit (INDEPENDENT_AMBULATORY_CARE_PROVIDER_SITE_OTHER): Payer: Medicaid Other | Admitting: Cardiovascular Disease

## 2014-02-10 ENCOUNTER — Encounter: Payer: Self-pay | Admitting: Cardiovascular Disease

## 2014-02-10 VITALS — BP 131/87 | HR 71 | Ht 70.0 in | Wt 241.0 lb

## 2014-02-10 DIAGNOSIS — I10 Essential (primary) hypertension: Secondary | ICD-10-CM

## 2014-02-10 DIAGNOSIS — R0989 Other specified symptoms and signs involving the circulatory and respiratory systems: Secondary | ICD-10-CM

## 2014-02-10 DIAGNOSIS — Z951 Presence of aortocoronary bypass graft: Secondary | ICD-10-CM

## 2014-02-10 DIAGNOSIS — R9439 Abnormal result of other cardiovascular function study: Secondary | ICD-10-CM

## 2014-02-10 DIAGNOSIS — I2510 Atherosclerotic heart disease of native coronary artery without angina pectoris: Secondary | ICD-10-CM

## 2014-02-10 DIAGNOSIS — R0600 Dyspnea, unspecified: Secondary | ICD-10-CM

## 2014-02-10 DIAGNOSIS — E785 Hyperlipidemia, unspecified: Secondary | ICD-10-CM

## 2014-02-10 DIAGNOSIS — E119 Type 2 diabetes mellitus without complications: Secondary | ICD-10-CM

## 2014-02-10 DIAGNOSIS — R0609 Other forms of dyspnea: Secondary | ICD-10-CM

## 2014-02-10 MED ORDER — ISOSORBIDE DINITRATE 10 MG PO TABS: 10.0000 mg | ORAL_TABLET | Freq: Three times a day (TID) | ORAL | Status: DC

## 2014-02-10 NOTE — Progress Notes (Signed)
Patient ID: George [name], male   DOB: August 12, 1961, 53 y.o.   MRN: [id number]      SUBJECTIVE: The patient is a 53 year old male who I am evaluating for the first time. He has a history of coronary artery disease and CABG in February 2014, with a LIMA to the LAD, sequential saphenous vein graft to the first and second obtuse marginal branches, and a left radial artery to the posterior descending artery. He also has a history of hypertension, diabetes, and hyperlipidemia.  When he saw Jory Sims NP in early March, he had been complaining of worsening dyspnea on exertion and chest pressure, as well as a 15 pound weight gain. He had been feeling more fatigued.  He underwent a stress echocardiogram which had poor visualization of the myocardium, with possible anteroseptal hypokinesis. He also had below average functional capacity. Resting left ventricular systolic function was normal. ECG 2 minutes into recovery revealed horizontal ST depressions in the anterolateral, precordial, and inferior limb leads 1-2 mm, and T-wave inversions in the lateral limb leads. Changes improved approx 6 minutes into recovery.  He tells me that after his bypass surgery he had been feeling well. He tried walking and even swimming and had no difficulties with this. He denies tobacco use. He said that for the past 3-4 months, he has been having increasing dyspnea with exertion. He said well it is somewhat similar to his symptoms prior to bypass surgery, but not nearly as intense.  He said he has put on a significant amount of weight due to lack of exercise, overeating, and blames it on a divorce and poor relationship choices.  When asked about blood sugars, he said a fasting blood sugar this morning was 215. He said that his HbA1c used to be 11.5 and then reduced to 5.5, and is due to be rechecked in 3 weeks.   No Known Allergies  Current Outpatient Prescriptions  Medication Sig Dispense Refill  . aspirin 325 MG  tablet Take 325 mg by mouth daily.        . diazepam (VALIUM) 10 MG tablet Take 10 mg by mouth every 6 (six) hours as needed for anxiety.      . fish oil-omega-3 fatty acids 1000 MG capsule Take 1 g by mouth daily.       [name] lisinopril (PRINIVIL,ZESTRIL) 20 MG tablet Take 20 mg by mouth daily.      . metFORMIN (GLUCOPHAGE) 1000 MG tablet Take 1 tablet (1,000 mg total) by mouth 2 (two) times daily with a meal.  60 tablet  1  . metoprolol tartrate (LOPRESSOR) 25 MG tablet Take 1 tablet (25 mg total) by mouth 2 (two) times daily.  60 tablet  1  . oxyCODONE-acetaminophen (PERCOCET) 10-325 MG per tablet Take 1 tablet by mouth every 4 (four) hours as needed for pain. Pt said he breaks the pills in half and takes 1/2 about every 3-4 hours      . simvastatin (ZOCOR) 40 MG tablet Take 1 tablet (40 mg total) by mouth at bedtime.  30 tablet  4   No current facility-administered medications for this visit.    Past Medical History  Diagnosis Date  . Hyperlipidemia   . Coronary artery disease   . Hypertriglyceridemia   . Dyslipidemia   . Tobacco abuse   . AMI (acute myocardial infarction) 2005  . Diabetes mellitus without complication     Past Surgical History  Procedure Laterality Date  . Lumbar spine surgery  2005  .  Coronary stent placement  2005    LAD50/70, CFX OK, PL1 90, PL2 40, RCA 95->0 with 2.5 x 60 mm Taxus stent, EF 60  . Angioplasty    . Coronary angioplasty    . Coronary artery bypass graft N/A 12/31/2012    Procedure: CORONARY ARTERY BYPASS GRAFTING (CABG);  Surgeon: Melrose Nakayama, MD;  Location: [place];  Service: Open Heart Surgery;  Laterality: N/A;  times four using left internal mammary artery, left radial artery, endoscopically harvested right saphenous vein  . Radial artery harvest Left 12/31/2012    Procedure: RADIAL ARTERY HARVEST;  Surgeon: Melrose Nakayama, MD;  Location: [place];  Service: Open Heart Surgery;  Laterality: Left;  . Colonoscopy N/A 06/29/2013     Procedure: COLONOSCOPY;  Surgeon: Danie Binder, MD;  [facility];  Service: Endoscopy;  Laterality: N/A;  10:45 AM    History   Social History  . Marital Status: Married    Spouse Name: N/A    Number of Children: N/A  . Years of Education: N/A   Occupational History  . Disabled    Social History Main Topics  . Smoking status: Former Smoker -- 2.00 packs/day for 10 years    Types: Cigars  . Smokeless tobacco: Never Used  . Alcohol Use: Yes     Comment: Occasional  . Drug Use: Yes    Special: Marijuana     Comment: Last time-02/2013  . Sexual Activity: Not on file   Other Topics Concern  . Not on file   Social History Narrative   Married   Sedentary     Filed Vitals:   02/10/14 1101  BP: 131/87  Pulse: 71  Height: 5\' 10"  (1.778 m)  Weight: 241 lb (109.317 kg)    PHYSICAL EXAM General: NAD Neck: No JVD, no thyromegaly. Lungs: Clear to auscultation bilaterally with normal respiratory effort. CV: Nondisplaced PMI.  Regular rate and rhythm, normal S1/S2, no S3/S4, soft I/VI systolic murmur at RUSB and LUSB. No pretibial or periankle edema.  No carotid bruit.  Normal pedal pulses.  Abdomen: Soft, nontender, no hepatosplenomegaly, no distention.  Neurologic: Alert and oriented x 3.  Psych: Normal affect. Extremities: No clubbing or cyanosis.   ECG: reviewed and available in electronic records.  Stress Echo:  Study Conclusions  - Procedure narrative: Treadmill exercise testing was performed using the Bruce protocol. The patient exercised for 4 min 46 sec, to protocol stage 2, to a maximal work rate of 26mets. Exercise was terminated due to patient request. The patient was positioned for image acquisition and recovery monitoring. - Stress ECG conclusions: EKG during stress shows no specific ischemic changes and no significant arryhtmias. 2 minutes into recovery there was horizontal ST depressions in the anterolateral precordial and inferior  limbleads 1-2 mm, and T-wave inversions in the lateral limb leads. Changes improved approx 6 minutes into recovery. - Staged echo: Baseline echo with normal LVEF 60-65. In parasternal short axis it is unclear if there is underlying anteroseptal wall hypokinesis of the view is off axis. Poor acoustic windows, myocardium poorly visualized in the stress images. Possible anteroseptal wall hypokinesis. - Impressions: Electrically abnormal stress echo for ischemia. Poor visualization of myocardium in the post stress images, possible hypokinesis of the anteroseptum. Correlate clinically, consider alternative stress modality or invasive evaluation. Duke treadmill score is -6, consistent with intermediate risk for major cardiac events. Below average functional capacity (80% of age/gender predicted) Impressions:  - Electrically abnormal stress echo for ischemia.  Poor visualization of myocardium in the post stress images, possible hypokinesis of the anteroseptum. Correlate clinically, consider alternative stress modality or invasive evaluation. Duke treadmill score is -6, consistent with intermediate risk for major cardiac events. Below average functional capacity (80% of age/gender predicted)     ASSESSMENT AND PLAN: 1. Dyspnea on exertion with fatigue, in the setting of an electrically abnormal stress echocardiogram with poor myocardial visualization and possible anteroseptal hypokinesis: I will initiate isosorbide dinitrate 10 mg three times daily to see if this affords him any relief. I will hold off on an invasive evaluation. Continue aspirin, ACE inhibitor, beta blocker, and statin. 2. Hypertension: controlled on current therapy. 3. Hyperlipidemia: Continue statin therapy.  Dispo: f/u 6-8 weeks.  Kate Sable, M.D., F.A.C.C.

## 2014-02-10 NOTE — Patient Instructions (Signed)
   Begin Isosorbide DN 10mg  three times per day - new sent to pharm Continue all other medications.   Follow up in 6-8 weeks

## 2014-02-12 ENCOUNTER — Telehealth: Payer: Self-pay | Admitting: Cardiovascular Disease

## 2014-02-12 NOTE — Telephone Encounter (Signed)
BP this morning 11:00 am 180/110  74.  Had already taken morning medications.   12:00  160/108  74 1:00    132/80  69  - after self medicating. ("I smoked a joint.") - patient stated he was told to do this by heart surgeon & Dr. Johnsie Cancel when nothing else worked to bring it down.  Stated he had been smoking this for the last 30 years & has never had any bad side effects from it.  States he is still taking the Isordil DN, but after taking he gets instant heart burn, lays down 30 minutes to an hour, then feels better.  States that the BP elevation is new since starting this medication.  Also, can not tell that he feels any better on this medication.  Advised him that message will be sent to provider for advice.  In the meantime, advised to go to ED if symptoms worsen.  Patient then states he thinks he has a blockage.  Again, strongly advised him to to go to ED if necessary.  Has follow up scheduled for 03/25/2014.

## 2014-02-12 NOTE — Telephone Encounter (Signed)
This encounter created in error.

## 2014-02-12 NOTE — Telephone Encounter (Signed)
Patient states that he was seen by Dr. Bronson Ing and placed on Isosorbide.  Today his blood pressure is elevated and he needs advise.

## 2014-02-17 NOTE — Telephone Encounter (Signed)
ISDN should not increase BP, but could only potentially decrease it. Can increase dose to 20 mg tid. I will reevaluate him at next visit.

## 2014-02-18 MED ORDER — ISOSORBIDE DINITRATE 10 MG PO TABS: 20.0000 mg | ORAL_TABLET | Freq: Three times a day (TID) | ORAL | Status: DC

## 2014-02-18 NOTE — Telephone Encounter (Signed)
Left message to return call 

## 2014-02-18 NOTE — Telephone Encounter (Signed)
Patient notified and verbalized understanding. 

## 2014-02-22 ENCOUNTER — Ambulatory Visit: Payer: Medicaid Other | Admitting: Cardiovascular Disease

## 2014-03-01 ENCOUNTER — Other Ambulatory Visit: Payer: Self-pay | Admitting: *Deleted

## 2014-03-01 MED ORDER — ISOSORBIDE DINITRATE 20 MG PO TABS: 20.0000 mg | ORAL_TABLET | Freq: Three times a day (TID) | ORAL | Status: DC

## 2014-03-25 ENCOUNTER — Ambulatory Visit (INDEPENDENT_AMBULATORY_CARE_PROVIDER_SITE_OTHER): Payer: Medicaid Other | Admitting: Cardiovascular Disease

## 2014-03-25 ENCOUNTER — Encounter: Payer: Self-pay | Admitting: Cardiovascular Disease

## 2014-03-25 VITALS — BP 149/88 | HR 70 | Ht 70.0 in | Wt 243.0 lb

## 2014-03-25 DIAGNOSIS — R9439 Abnormal result of other cardiovascular function study: Secondary | ICD-10-CM

## 2014-03-25 DIAGNOSIS — R079 Chest pain, unspecified: Secondary | ICD-10-CM

## 2014-03-25 DIAGNOSIS — Z951 Presence of aortocoronary bypass graft: Secondary | ICD-10-CM

## 2014-03-25 DIAGNOSIS — R0600 Dyspnea, unspecified: Secondary | ICD-10-CM

## 2014-03-25 DIAGNOSIS — R0989 Other specified symptoms and signs involving the circulatory and respiratory systems: Secondary | ICD-10-CM

## 2014-03-25 DIAGNOSIS — R0609 Other forms of dyspnea: Secondary | ICD-10-CM

## 2014-03-25 DIAGNOSIS — I10 Essential (primary) hypertension: Secondary | ICD-10-CM

## 2014-03-25 DIAGNOSIS — E119 Type 2 diabetes mellitus without complications: Secondary | ICD-10-CM

## 2014-03-25 DIAGNOSIS — I2510 Atherosclerotic heart disease of native coronary artery without angina pectoris: Secondary | ICD-10-CM

## 2014-03-25 DIAGNOSIS — E785 Hyperlipidemia, unspecified: Secondary | ICD-10-CM

## 2014-03-25 MED ORDER — SIMVASTATIN 40 MG PO TABS: 40.0000 mg | ORAL_TABLET | Freq: Every day | ORAL | Status: DC

## 2014-03-25 MED ORDER — METOPROLOL TARTRATE 25 MG PO TABS: 25.0000 mg | ORAL_TABLET | Freq: Two times a day (BID) | ORAL | Status: DC

## 2014-03-25 MED ORDER — ISOSORBIDE DINITRATE 30 MG PO TABS: 30.0000 mg | ORAL_TABLET | Freq: Three times a day (TID) | ORAL | Status: DC

## 2014-03-25 MED ORDER — LISINOPRIL 20 MG PO TABS: 20.0000 mg | ORAL_TABLET | Freq: Two times a day (BID) | ORAL | Status: DC

## 2014-03-25 NOTE — Progress Notes (Signed)
Patient ID: George [name], male   DOB: 07/01/1961, 53 y.o.   MRN: [id number]      SUBJECTIVE: When I last saw the patient, he had been complaining of some dyspnea with exertion and fatigue. I started him on isosorbide dinitrate and this seems to have alleviated his symptoms to some degree. However, if he exerts himself such as walking from the parking lot to the office, he experiences exertional chest pain and dyspnea. He said he felt very well after his bypass surgery but began noticing this change in symptomatology approximately 6 months ago. He has been monitoring his blood pressure at home and he believes he averages approximately 135/85 at rest, but does get significantly elevated with any amount of exertion.    No Known Allergies  Current Outpatient Prescriptions  Medication Sig Dispense Refill  . aspirin 325 MG tablet Take 325 mg by mouth daily.        . fish oil-omega-3 fatty acids 1000 MG capsule Take 1 g by mouth daily.       . isosorbide dinitrate (ISORDIL) 20 MG tablet Take 1 tablet (20 mg total) by mouth 3 (three) times daily.  90 tablet  6  . lisinopril (PRINIVIL,ZESTRIL) 20 MG tablet Take 20 mg by mouth daily.      . metFORMIN (GLUCOPHAGE) 1000 MG tablet Take 1 tablet (1,000 mg total) by mouth 2 (two) times daily with a meal.  60 tablet  1  . metoprolol tartrate (LOPRESSOR) 25 MG tablet Take 1 tablet (25 mg total) by mouth 2 (two) times daily.  60 tablet  1  . simvastatin (ZOCOR) 40 MG tablet Take 1 tablet (40 mg total) by mouth at bedtime.  30 tablet  4   No current facility-administered medications for this visit.    Past Medical History  Diagnosis Date  . Hyperlipidemia   . Coronary artery disease   . Hypertriglyceridemia   . Dyslipidemia   . Tobacco abuse   . AMI (acute myocardial infarction) 2005  . Diabetes mellitus without complication     Past Surgical History  Procedure Laterality Date  . Lumbar spine surgery  2005  . Coronary stent placement  2005   LAD50/70, CFX OK, PL1 90, PL2 40, RCA 95->0 with 2.5 x 60 mm Taxus stent, EF 60  . Angioplasty    . Coronary angioplasty    . Coronary artery bypass graft N/A 12/31/2012    Procedure: CORONARY ARTERY BYPASS GRAFTING (CABG);  Surgeon: Melrose Nakayama, MD;  Location: [place];  Service: Open Heart Surgery;  Laterality: N/A;  times four using left internal mammary artery, left radial artery, endoscopically harvested right saphenous vein  . Radial artery harvest Left 12/31/2012    Procedure: RADIAL ARTERY HARVEST;  Surgeon: Melrose Nakayama, MD;  Location: [place];  Service: Open Heart Surgery;  Laterality: Left;  . Colonoscopy N/A 06/29/2013    Procedure: COLONOSCOPY;  Surgeon: Danie Binder, MD;  [facility];  Service: Endoscopy;  Laterality: N/A;  10:45 AM    History   Social History  . Marital Status: Married    Spouse Name: N/A    Number of Children: N/A  . Years of Education: N/A   Occupational History  . Disabled    Social History Main Topics  . Smoking status: Former Smoker -- 2.00 packs/day for 10 years    Types: Cigars  . Smokeless tobacco: Never Used  . Alcohol Use: Yes     Comment: Occasional  .  Drug Use: Yes    Special: Marijuana     Comment: Last time-02/2013  . Sexual Activity: Not on file   Other Topics Concern  . Not on file   Social History Narrative   Married   Sedentary     Filed Vitals:   03/25/14 1004  BP: 149/88  Pulse: 70  Height: 5\' 10"  (1.778 m)  Weight: 243 lb (110.224 kg)    PHYSICAL EXAM General: NAD Neck: No JVD, no thyromegaly. Lungs: Clear to auscultation bilaterally with normal respiratory effort. CV: Nondisplaced PMI.  Regular rate and rhythm, normal S1/S2, no S3/S4, no murmur. No pretibial or periankle edema.  No carotid bruit.  Normal pedal pulses.  Abdomen: Soft, nontender, no hepatosplenomegaly, no distention.  Neurologic: Alert and oriented x 3.  Psych: Normal affect. Extremities: No clubbing or cyanosis.    ECG: reviewed and available in electronic records.      ASSESSMENT AND PLAN: 1. Dyspnea on exertion with fatigue, in the setting of an electrically abnormal stress echocardiogram with poor myocardial visualization and possible anteroseptal hypokinesis: I will increase isosorbide dinitrate to 30 mg three times daily to see if this affords him any relief. I will hold off on an invasive evaluation. Continue aspirin, beta blocker, and statin. I will also increase lisinopril to 20 mg twice daily. 2. Hypertension: Uncontrolled on current therapy. I will increase lisinopril to 20 mg twice daily. 3. Hyperlipidemia: Continue statin therapy.   Dispo: f/u 2 months.   Kate Sable, M.D., F.A.C.C.

## 2014-03-25 NOTE — Patient Instructions (Signed)
   Increase Isosorbide DN to 30mg  three times per day (may take 1 1/2 tabs of your 20mg  tablet 3 x per day till finish current supply)   Increase Lisinopril to 20mg  twice a day    New prescriptions sent to pharm  Continue all other medications.   Follow up in  2 months

## 2014-05-26 ENCOUNTER — Ambulatory Visit (INDEPENDENT_AMBULATORY_CARE_PROVIDER_SITE_OTHER): Payer: Medicaid Other | Admitting: Cardiovascular Disease

## 2014-05-26 ENCOUNTER — Encounter: Payer: Self-pay | Admitting: Cardiovascular Disease

## 2014-05-26 VITALS — BP 144/86 | HR 59 | Ht 70.0 in | Wt 237.0 lb

## 2014-05-26 DIAGNOSIS — I10 Essential (primary) hypertension: Secondary | ICD-10-CM

## 2014-05-26 DIAGNOSIS — E119 Type 2 diabetes mellitus without complications: Secondary | ICD-10-CM

## 2014-05-26 DIAGNOSIS — R0600 Dyspnea, unspecified: Secondary | ICD-10-CM

## 2014-05-26 DIAGNOSIS — R0609 Other forms of dyspnea: Secondary | ICD-10-CM

## 2014-05-26 DIAGNOSIS — R079 Chest pain, unspecified: Secondary | ICD-10-CM

## 2014-05-26 DIAGNOSIS — Z951 Presence of aortocoronary bypass graft: Secondary | ICD-10-CM

## 2014-05-26 DIAGNOSIS — Z79899 Other long term (current) drug therapy: Secondary | ICD-10-CM

## 2014-05-26 DIAGNOSIS — E781 Pure hyperglyceridemia: Secondary | ICD-10-CM

## 2014-05-26 DIAGNOSIS — R5381 Other malaise: Secondary | ICD-10-CM

## 2014-05-26 DIAGNOSIS — R0989 Other specified symptoms and signs involving the circulatory and respiratory systems: Secondary | ICD-10-CM

## 2014-05-26 DIAGNOSIS — R5383 Other fatigue: Secondary | ICD-10-CM

## 2014-05-26 MED ORDER — ASPIRIN EC 81 MG PO TBEC: 81.0000 mg | DELAYED_RELEASE_TABLET | Freq: Every day | ORAL | Status: AC

## 2014-05-26 MED ORDER — METFORMIN HCL 500 MG PO TABS: 500.0000 mg | ORAL_TABLET | Freq: Two times a day (BID) | ORAL | Status: DC

## 2014-05-26 NOTE — Patient Instructions (Addendum)
   Decrease Aspirin to 81mg  daily  Decrease Metformin to 500mg  twice a day  - new sent to pharm Continue all other medications.   Labs for HgA1c, BMET, FLP, LFT  Office will contact with results via phone or letter.   Primary MD list provided today Follow up in  3 months

## 2014-05-26 NOTE — Progress Notes (Signed)
Patient ID: George [name], male   DOB: Aug 24, 1961, 53 y.o.   MRN: [id number]      SUBJECTIVE: George [name] is here to followup for dyspnea on exertion and fatigue as well as hypertension, in the setting of coronary artery disease and CABG with prior PCI. At his last visit, I increased isosorbide dinitrate to 30 mg three times daily and increased lisinopril to 20 mg twice daily. He is feeling much better with respect to dyspnea. He denies chest pain. He has been searching for a PCP with no success. He has been out of metformin for the past 6 weeks. His blood sugars have been averaging in the 300 range. He has not had a recent HbA1c.   No Known Allergies  Current Outpatient Prescriptions  Medication Sig Dispense Refill  . aspirin 325 MG tablet Take 325 mg by mouth daily.        . fish oil-omega-3 fatty acids 1000 MG capsule Take 1 g by mouth daily.       . isosorbide dinitrate (ISORDIL) 30 MG tablet Take 1 tablet (30 mg total) by mouth 3 (three) times daily.  90 tablet  6  . lisinopril (PRINIVIL,ZESTRIL) 20 MG tablet Take 1 tablet (20 mg total) by mouth 2 (two) times daily.  60 tablet  6  . metFORMIN (GLUCOPHAGE) 1000 MG tablet Take 1 tablet (1,000 mg total) by mouth 2 (two) times daily with a meal.  60 tablet  1  . metoprolol tartrate (LOPRESSOR) 25 MG tablet Take 1 tablet (25 mg total) by mouth 2 (two) times daily.  60 tablet  6  . simvastatin (ZOCOR) 40 MG tablet Take 1 tablet (40 mg total) by mouth at bedtime.  30 tablet  6   No current facility-administered medications for this visit.    Past Medical History  Diagnosis Date  . Hyperlipidemia   . Coronary artery disease   . Hypertriglyceridemia   . Dyslipidemia   . Tobacco abuse   . AMI (acute myocardial infarction) 2005  . Diabetes mellitus without complication     Past Surgical History  Procedure Laterality Date  . Lumbar spine surgery  2005  . Coronary stent placement  2005    LAD50/70, CFX OK, PL1 90, PL2 40, RCA 95->0 with  2.5 x 60 mm Taxus stent, EF 60  . Angioplasty    . Coronary angioplasty    . Coronary artery bypass graft N/A 12/31/2012    Procedure: CORONARY ARTERY BYPASS GRAFTING (CABG);  Surgeon: Melrose Nakayama, MD;  Location: [place];  Service: Open Heart Surgery;  Laterality: N/A;  times four using left internal mammary artery, left radial artery, endoscopically harvested right saphenous vein  . Radial artery harvest Left 12/31/2012    Procedure: RADIAL ARTERY HARVEST;  Surgeon: Melrose Nakayama, MD;  Location: [place];  Service: Open Heart Surgery;  Laterality: Left;  . Colonoscopy N/A 06/29/2013    Procedure: COLONOSCOPY;  Surgeon: Danie Binder, MD;  [facility];  Service: Endoscopy;  Laterality: N/A;  10:45 AM    History   Social History  . Marital Status: Married    Spouse Name: N/A    Number of Children: N/A  . Years of Education: N/A   Occupational History  . Disabled    Social History Main Topics  . Smoking status: Former Smoker -- 2.00 packs/day for 10 years    Types: Cigars  . Smokeless tobacco: Never Used  . Alcohol Use: Yes  Comment: Occasional  . Drug Use: Yes    Special: Marijuana     Comment: Last time-02/2013  . Sexual Activity: Not on file   Other Topics Concern  . Not on file   Social History Narrative   Married   Sedentary    BP 144/86 Pulse 59    PHYSICAL EXAM General: NAD Neck: No JVD, no thyromegaly. Lungs: Clear to auscultation bilaterally with normal respiratory effort. CV: Nondisplaced PMI.  Regular rate and rhythm, normal S1/S2, no S3/S4, no murmur. No pretibial or periankle edema.  No carotid bruit.  Normal pedal pulses.  Abdomen: Soft, nontender, no hepatosplenomegaly, no distention.  Neurologic: Alert and oriented x 3.  Psych: Normal affect. Extremities: No clubbing or cyanosis.   ECG: reviewed and available in electronic records.      ASSESSMENT AND PLAN: 1. Dyspnea on exertion with fatigue, in the setting of an  electrically abnormal stress echocardiogram with poor myocardial visualization and possible anteroseptal hypokinesis: I will continue isosorbide dinitrate 30 mg three times daily as this has provided him significant relief. I will hold off on an invasive evaluation. Continue aspirin (reduce to 81 mg daily), beta blocker, and statin. I will continue lisinopril 20 mg twice daily.  2. Hypertension: Mildly elevated on lisinopril 20 mg twice daily. I will first attempt to control his diabetes, and continue to monitor his BP to see if further titration is warranted. 3. Hyperlipidemia: Continue statin therapy. Check lipids and LFT's. 4. CAD/CABG and PCI: as per #1. 5. Type II diabetes mellitus: He has been out of metformin. I will prescribe 500 mg bid initially to make certain he does not develop problems with hypoglycemia. Will check a BMET and HbA1C.   Dispo: f/u 3 months. I have recommended he contact either Dr. Tula Nakayama or Dr. Brantley Persons office to reestablish PCP care. He no longer sees Dr. Orson Ape.  Kate Sable, M.D., F.A.C.C.

## 2014-06-17 ENCOUNTER — Other Ambulatory Visit: Payer: Self-pay | Admitting: Cardiovascular Disease

## 2014-06-18 LAB — BASIC METABOLIC PANEL
BUN: 10 mg/dL (ref 6–23)
CHLORIDE: 101 meq/L (ref 96–112)
CO2: 27 mEq/L (ref 19–32)
Calcium: 9.5 mg/dL (ref 8.4–10.5)
Creat: 1.24 mg/dL (ref 0.50–1.35)
Glucose, Bld: 163 mg/dL — ABNORMAL HIGH (ref 70–99)
POTASSIUM: 4.2 meq/L (ref 3.5–5.3)
SODIUM: 140 meq/L (ref 135–145)

## 2014-06-18 LAB — LIPID PANEL
CHOLESTEROL: 140 mg/dL (ref 0–200)
HDL: 32 mg/dL — AB (ref >39)
LDL CALC: 57 mg/dL (ref 0–99)
TRIGLYCERIDES: 254 mg/dL — AB (ref <150)
Total CHOL/HDL Ratio: 4.4 Ratio
VLDL: 51 mg/dL — AB (ref 0–40)

## 2014-06-18 LAB — HEPATIC FUNCTION PANEL
ALBUMIN: 4.5 g/dL (ref 3.5–5.2)
ALT: 37 U/L (ref 0–53)
AST: 18 U/L (ref 0–37)
Alkaline Phosphatase: 51 U/L (ref 39–117)
Bilirubin, Direct: 0.1 mg/dL (ref 0.0–0.3)
Indirect Bilirubin: 0.5 mg/dL (ref 0.2–1.2)
TOTAL PROTEIN: 7 g/dL (ref 6.0–8.3)
Total Bilirubin: 0.6 mg/dL (ref 0.2–1.2)

## 2014-06-18 LAB — HEMOGLOBIN A1C
Hgb A1c MFr Bld: 7.7 % — ABNORMAL HIGH (ref <5.7)
MEAN PLASMA GLUCOSE: 174 mg/dL — AB (ref <117)

## 2014-06-21 ENCOUNTER — Telehealth: Payer: Self-pay | Admitting: Cardiovascular Disease

## 2014-06-21 NOTE — Telephone Encounter (Signed)
Spoke to patient concerning lab/test results/instructions from provider. Patient understood.    

## 2014-06-21 NOTE — Telephone Encounter (Signed)
Returning call from [place] on Friday to discuss blood work results / tgs

## 2014-08-07 IMAGING — CR DG CHEST 2V
2 series · 2 of 2 positions shown · non-contrast
Comparison: Portable chest x-ray of 01/02/2013

CLINICAL DATA: CABG 3 weeks ago, some chest pain and weakness,
follow-up

CHEST - 2 VIEW

[w chest pa]
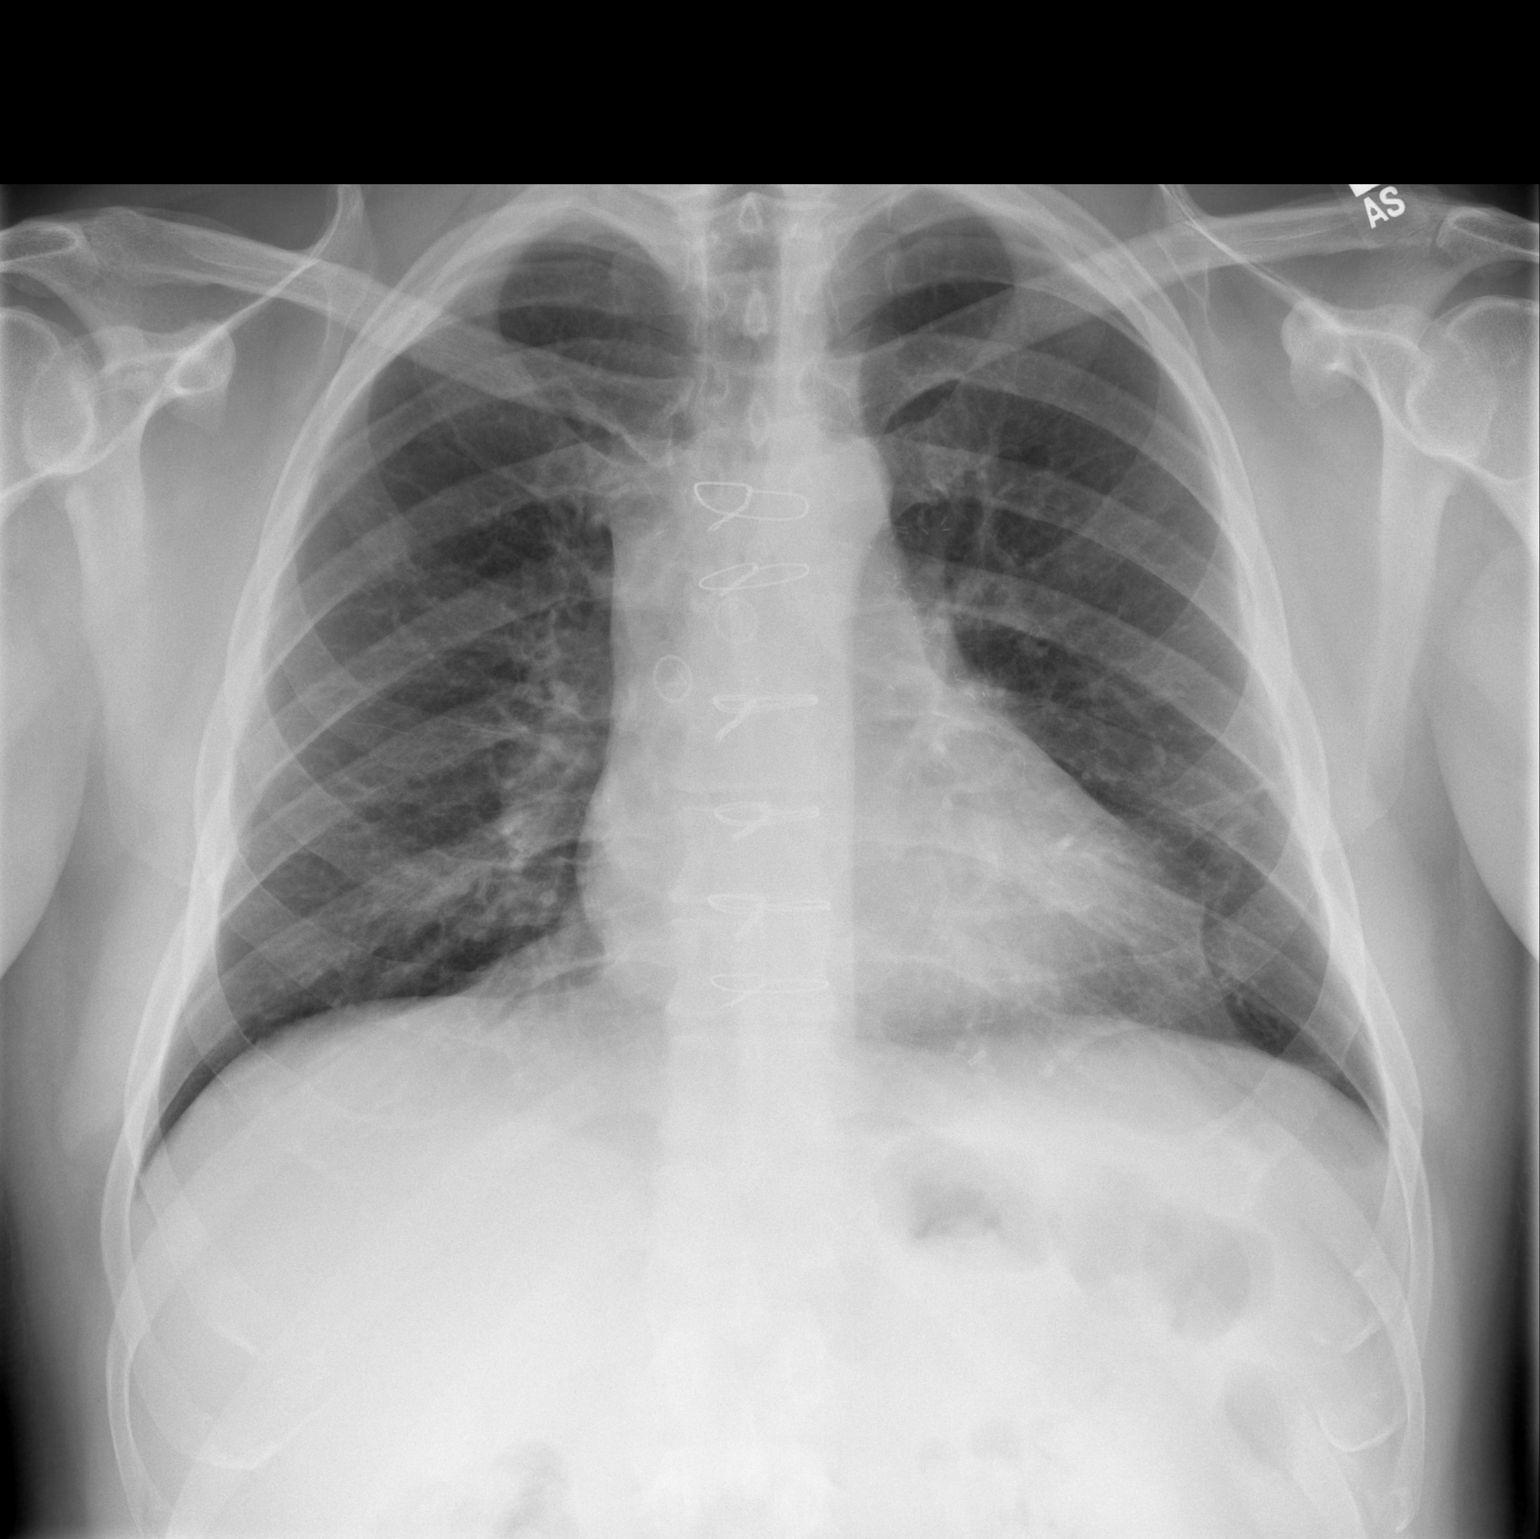

[w chest lat]
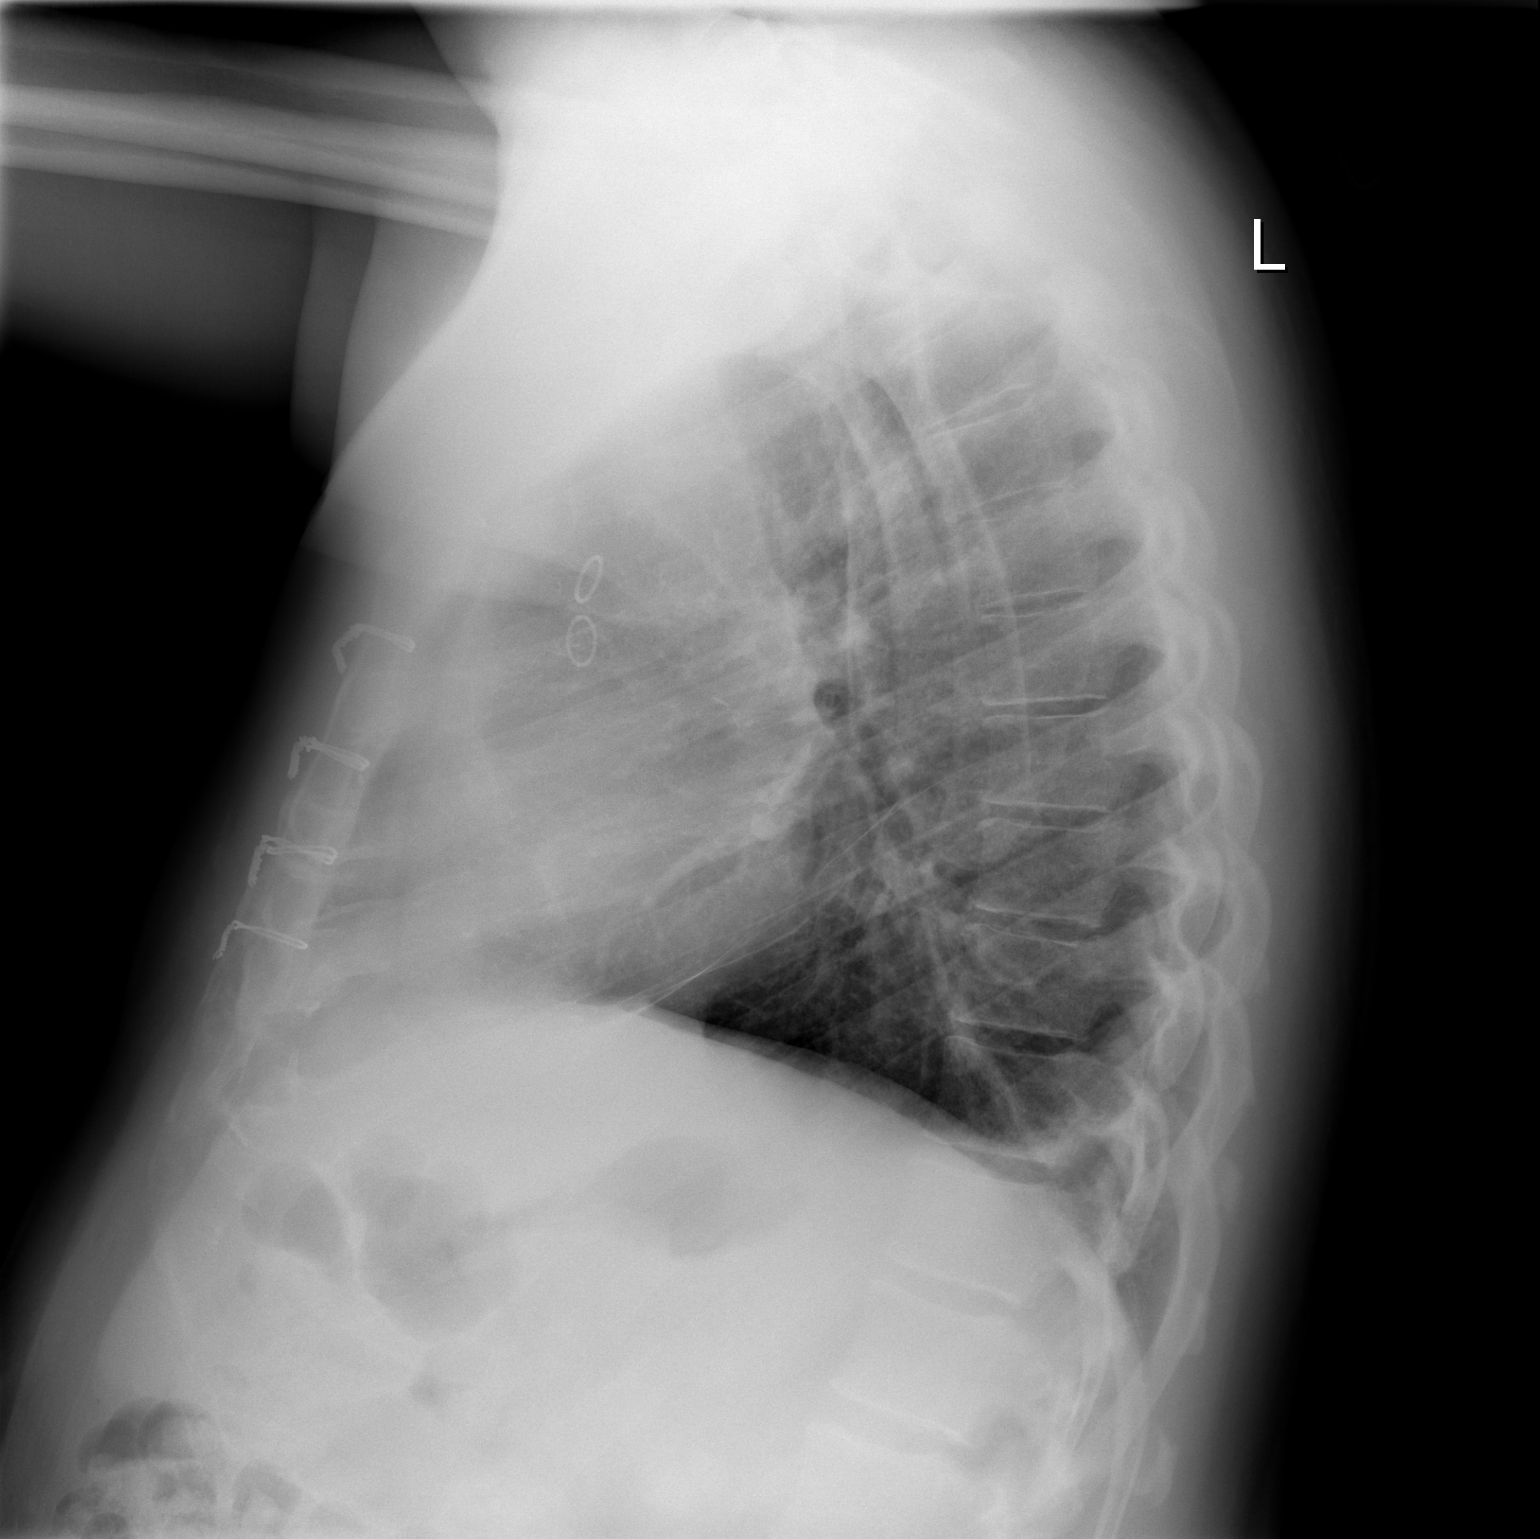

[2 of 2 positions shown; findings below may reference images not displayed]

FINDINGS: There appears be a small pleural effusion remaining
probably on the left.  Mild cardiomegaly is stable.  No
pneumothorax is seen.  Median sternotomy sutures are noted.  No
bony abnormality is seen.
IMPRESSION: Small pleural effusion probably on the left.

## 2014-09-03 ENCOUNTER — Ambulatory Visit (INDEPENDENT_AMBULATORY_CARE_PROVIDER_SITE_OTHER): Payer: Medicaid Other | Admitting: Cardiovascular Disease

## 2014-09-03 ENCOUNTER — Encounter: Payer: Self-pay | Admitting: Cardiovascular Disease

## 2014-09-03 VITALS — BP 147/82 | HR 62 | Ht 70.0 in | Wt 236.8 lb

## 2014-09-03 DIAGNOSIS — E781 Pure hyperglyceridemia: Secondary | ICD-10-CM

## 2014-09-03 DIAGNOSIS — E119 Type 2 diabetes mellitus without complications: Secondary | ICD-10-CM

## 2014-09-03 DIAGNOSIS — R0600 Dyspnea, unspecified: Secondary | ICD-10-CM | POA: Diagnosis not present

## 2014-09-03 DIAGNOSIS — I10 Essential (primary) hypertension: Secondary | ICD-10-CM

## 2014-09-03 DIAGNOSIS — R5383 Other fatigue: Secondary | ICD-10-CM

## 2014-09-03 DIAGNOSIS — R079 Chest pain, unspecified: Secondary | ICD-10-CM

## 2014-09-03 DIAGNOSIS — Z951 Presence of aortocoronary bypass graft: Secondary | ICD-10-CM

## 2014-09-03 DIAGNOSIS — I480 Paroxysmal atrial fibrillation: Secondary | ICD-10-CM

## 2014-09-03 MED ORDER — NITROGLYCERIN 0.4 MG SL SUBL: 0.4000 mg | SUBLINGUAL_TABLET | SUBLINGUAL | Status: DC | PRN

## 2014-09-03 MED ORDER — RANOLAZINE ER 500 MG PO TB12: 500.0000 mg | ORAL_TABLET | Freq: Two times a day (BID) | ORAL | Status: DC

## 2014-09-03 NOTE — Patient Instructions (Signed)
   Nitroglycerin as needed for severe chest pain only - new sent to pharm  Ranexa 500mg  twice a day  - samples provided for 1 week  Call office with update on response to medication, will send to pharmacy if no problems. Continue all other medications.   Your physician wants you to follow up in:  4 months.  You will receive a reminder letter in the mail one-two months in advance.  If you don't receive a letter, please call our office to schedule the follow up appointment

## 2014-09-03 NOTE — Progress Notes (Signed)
Patient ID: GUSS FARRUGGIA, male   DOB: 02-02-1961, 53 y.o.   MRN: [id number]      SUBJECTIVE: Mr. Mckinney is here to followup for dyspnea on exertion and fatigue as well as hypertension, in the setting of coronary artery disease and CABG with prior PCI. He now sees a primary care physician in [place]. He did not take his medications this morning. He has difficulty sleeping. His dyspnea on exertion and chest discomfort are about the same since his last visit. His chest discomfort is aggravated by anxiety and other emotional stressors. He was not able to afford the fish oil. He is no longer taking pain medications. Stress echocardiogram in March 2015 was a poor quality study with possible off axis images. There may have been possible anteroseptal hypokinesis. He had reduced exercise tolerance.  HbA1C 7.7 on 8/6. Lipids on 8/6: TC 140, TG 254, HDL 32, LDL 47.  8/6 BMET: BUN 10, creatinine 1.24   Review of Systems: As per "subjective", otherwise negative.  No Known Allergies  Current Outpatient Prescriptions  Medication Sig Dispense Refill  . aspirin EC 81 MG tablet Take 1 tablet (81 mg total) by mouth daily.      . isosorbide dinitrate (ISORDIL) 30 MG tablet Take 1 tablet (30 mg total) by mouth 3 (three) times daily.  90 tablet  6  . lisinopril (PRINIVIL,ZESTRIL) 20 MG tablet Take 1 tablet (20 mg total) by mouth 2 (two) times daily.  60 tablet  6  . metFORMIN (GLUCOPHAGE) 500 MG tablet Take 1 tablet (500 mg total) by mouth 2 (two) times daily with a meal.  60 tablet  1  . metoprolol tartrate (LOPRESSOR) 25 MG tablet Take 1 tablet (25 mg total) by mouth 2 (two) times daily.  60 tablet  6  . simvastatin (ZOCOR) 40 MG tablet Take 1 tablet (40 mg total) by mouth at bedtime.  30 tablet  6   No current facility-administered medications for this visit.    Past Medical History  Diagnosis Date  . Hyperlipidemia   . Coronary artery disease   . Hypertriglyceridemia   . Dyslipidemia   . Tobacco  abuse   . AMI (acute myocardial infarction) 2005  . Diabetes mellitus without complication     Past Surgical History  Procedure Laterality Date  . Lumbar spine surgery  2005  . Coronary stent placement  2005    LAD50/70, CFX OK, PL1 90, PL2 40, RCA 95->0 with 2.5 x 60 mm Taxus stent, EF 60  . Angioplasty    . Coronary angioplasty    . Coronary artery bypass graft N/A 12/31/2012    Procedure: CORONARY ARTERY BYPASS GRAFTING (CABG);  Surgeon: Melrose Nakayama, MD;  Location: [place];  Service: Open Heart Surgery;  Laterality: N/A;  times four using left internal mammary artery, left radial artery, endoscopically harvested right saphenous vein  . Radial artery harvest Left 12/31/2012    Procedure: RADIAL ARTERY HARVEST;  Surgeon: Melrose Nakayama, MD;  Location: [place];  Service: Open Heart Surgery;  Laterality: Left;  . Colonoscopy N/A 06/29/2013    Procedure: COLONOSCOPY;  Surgeon: Danie Binder, MD;  [facility];  Service: Endoscopy;  Laterality: N/A;  10:45 AM    History   Social History  . Marital Status: Married    Spouse Name: N/A    Number of Children: N/A  . Years of Education: N/A   Occupational History  . Disabled    Social History Main Topics  .  Smoking status: Former Smoker -- 2.00 packs/day for 10 years    Types: Cigars  . Smokeless tobacco: Never Used  . Alcohol Use: Yes     Comment: Occasional  . Drug Use: Yes    Special: Marijuana     Comment: Last time-02/2013  . Sexual Activity: Not on file   Other Topics Concern  . Not on file   Social History Narrative   Married   Sedentary     Filed Vitals:   09/03/14 0818  BP: 147/82  Pulse: 62  Height: 5\' 10"  (1.778 m)  Weight: 236 lb 12.8 oz (107.412 kg)  SpO2: 98%    PHYSICAL EXAM General: NAD HEENT: Normal. Neck: No JVD, no thyromegaly. Lungs: Clear to auscultation bilaterally with normal respiratory effort. CV: Nondisplaced PMI.  Regular rate and rhythm, normal S1/S2, no S3/S4,  soft I/VI systolic murmur over RUSB/LUSB. No pretibial or periankle edema.  No carotid bruit.  Normal pedal pulses.  Abdomen: Soft, nontender, no hepatosplenomegaly, no distention.  Neurologic: Alert and oriented x 3.  Psych: Normal affect. Skin: Normal. Musculoskeletal: Normal range of motion, no gross deformities. Extremities: No clubbing or cyanosis.   ECG: Most recent ECG reviewed.      ASSESSMENT AND PLAN: 1. Dyspnea on exertion with fatigue, in the setting of an electrically abnormal stress echocardiogram with poor myocardial visualization and possible anteroseptal hypokinesis: I will continue isosorbide dinitrate 30 mg three times daily as this has helped to a significant degree. I will provide samples of Ranexa 500 mg twice daily. I've asked him to call me in one week to see if he's had any improvement of his symptoms. If so, I would then give him a prescription for Ranexa. I will hold off on an invasive evaluation. Continue aspirinbeta blocker, and statin. I will continue lisinopril 20 mg twice daily. I will give a prescription for SL nitro. 2. Essential hypertension: Mildly elevated on lisinopril 20 mg twice daily. He did not take meds this morning. I would consider the addition of amlodipine if it remains elevated. 3. Hyperlipidemia: Continue statin therapy. Results as noted above. No changes.  4. CAD/CABG and PCI: as per #1.  5. Type II diabetes mellitus: Continue 500 mg bid as marked improvement in HbA1C over past one year.  Dispo: f/u 4 months.   Kate Sable, M.D., F.A.C.C.

## 2014-09-08 ENCOUNTER — Telehealth: Payer: Self-pay | Admitting: Cardiovascular Disease

## 2014-09-08 MED ORDER — RANOLAZINE ER 500 MG PO TB12: 500.0000 mg | ORAL_TABLET | Freq: Two times a day (BID) | ORAL | Status: DC

## 2014-09-08 NOTE — Telephone Encounter (Signed)
ranolazine (RANEXA) 500 MG 12 hr tablet Was told to call in and report how he s doing on this medicine  - Started 10/23 and has enough meds until tomorrow.  Said that he appears to be doing fine taking this medication. Asked that RX be sent into [facility] in [place]

## 2014-10-20 ENCOUNTER — Other Ambulatory Visit: Payer: Self-pay | Admitting: Cardiovascular Disease

## 2014-10-20 DIAGNOSIS — R0600 Dyspnea, unspecified: Secondary | ICD-10-CM

## 2014-10-20 DIAGNOSIS — I2510 Atherosclerotic heart disease of native coronary artery without angina pectoris: Secondary | ICD-10-CM

## 2014-10-20 DIAGNOSIS — I10 Essential (primary) hypertension: Secondary | ICD-10-CM

## 2014-10-20 DIAGNOSIS — I480 Paroxysmal atrial fibrillation: Secondary | ICD-10-CM

## 2014-10-20 DIAGNOSIS — E119 Type 2 diabetes mellitus without complications: Secondary | ICD-10-CM

## 2014-10-20 MED ORDER — LISINOPRIL 20 MG PO TABS: 20.0000 mg | ORAL_TABLET | Freq: Two times a day (BID) | ORAL | Status: DC

## 2014-10-20 MED ORDER — METOPROLOL TARTRATE 25 MG PO TABS: 25.0000 mg | ORAL_TABLET | Freq: Two times a day (BID) | ORAL | Status: DC

## 2014-10-20 MED ORDER — SIMVASTATIN 40 MG PO TABS: 40.0000 mg | ORAL_TABLET | Freq: Every day | ORAL | Status: DC

## 2014-10-20 NOTE — Telephone Encounter (Signed)
Received fax refill request  Rx # [id number] Medication:  Simvastatin 40 mg tablets Qty 30 Sig:  Take one tablet by mouth every night at bedtime Physician:  Bronson Ing Received fax refill request  Rx # [id number] Medication:  Metoprolol Tartarate 25 mg tablets Qty 60 Sig:  Take one tablet by mouth twice daily Physician:  Bronson Ing Received fax refill request  Rx # [id number] Medication:  Lisinopril 20 mg tablets Qty 60 Sig:  Take one tablet by mouth twice daily Physician:  Bronson Ing

## 2014-10-21 ENCOUNTER — Encounter (HOSPITAL_COMMUNITY): Payer: Self-pay | Admitting: Cardiovascular Disease

## 2014-10-28 ENCOUNTER — Other Ambulatory Visit: Payer: Self-pay | Admitting: *Deleted

## 2014-10-28 MED ORDER — ISOSORBIDE DINITRATE 30 MG PO TABS: 30.0000 mg | ORAL_TABLET | Freq: Three times a day (TID) | ORAL | Status: DC

## 2014-11-09 ENCOUNTER — Ambulatory Visit (INDEPENDENT_AMBULATORY_CARE_PROVIDER_SITE_OTHER): Payer: Medicaid Other | Admitting: Cardiovascular Disease

## 2014-11-09 ENCOUNTER — Telehealth: Payer: Self-pay | Admitting: *Deleted

## 2014-11-09 ENCOUNTER — Encounter: Payer: Self-pay | Admitting: Cardiovascular Disease

## 2014-11-09 VITALS — BP 131/79 | HR 61 | Ht 70.0 in | Wt 240.0 lb

## 2014-11-09 DIAGNOSIS — Z951 Presence of aortocoronary bypass graft: Secondary | ICD-10-CM

## 2014-11-09 DIAGNOSIS — R079 Chest pain, unspecified: Secondary | ICD-10-CM

## 2014-11-09 DIAGNOSIS — R9439 Abnormal result of other cardiovascular function study: Secondary | ICD-10-CM

## 2014-11-09 DIAGNOSIS — I25708 Atherosclerosis of coronary artery bypass graft(s), unspecified, with other forms of angina pectoris: Secondary | ICD-10-CM

## 2014-11-09 DIAGNOSIS — K5900 Constipation, unspecified: Secondary | ICD-10-CM

## 2014-11-09 DIAGNOSIS — I480 Paroxysmal atrial fibrillation: Secondary | ICD-10-CM

## 2014-11-09 DIAGNOSIS — R0600 Dyspnea, unspecified: Secondary | ICD-10-CM

## 2014-11-09 DIAGNOSIS — E119 Type 2 diabetes mellitus without complications: Secondary | ICD-10-CM

## 2014-11-09 DIAGNOSIS — I10 Essential (primary) hypertension: Secondary | ICD-10-CM

## 2014-11-09 MED ORDER — METOPROLOL TARTRATE 25 MG PO TABS: 12.5000 mg | ORAL_TABLET | Freq: Two times a day (BID) | ORAL | Status: DC

## 2014-11-09 MED ORDER — RANOLAZINE ER 1000 MG PO TB12: 1000.0000 mg | ORAL_TABLET | Freq: Two times a day (BID) | ORAL | Status: DC

## 2014-11-09 MED ORDER — METFORMIN HCL 1000 MG PO TABS: 1000.0000 mg | ORAL_TABLET | Freq: Two times a day (BID) | ORAL | Status: DC

## 2014-11-09 NOTE — Progress Notes (Signed)
Patient ID: George [name], male   DOB: February 05, 1961, 53 y.o.   MRN: [id number]      SUBJECTIVE: Mr. Schexnayder is here to followup for dyspnea on exertion and fatigue as well as hypertension, in the setting of coronary artery disease and CABG with prior PCI. Initially, his chest discomfort was relieved by Ranexa for at least 3 weeks. However in the past several weeks, he has had more frequent chest pain with exertion, which is similar to prior episodes. He has also been struggling with constipation and hemorrhoids. He stopped taking isosorbide dinitrate for one week to see if this would help but it made no difference with his constipation. He now takes metformin 1000 mg twice daily. His chest discomfort has also historically been aggravated by anxiety and other emotional stressors.  Stress echocardiogram in March 2015 was a poor quality study with possible off axis images. There may have been possible anteroseptal hypokinesis. He had reduced exercise tolerance.  HbA1C 7.7 on 8/6. Lipids on 8/6: TC 140, TG 254, HDL 32, LDL 47.  8/6 BMET: BUN 10, creatinine 1.24    Review of Systems: As per "subjective", otherwise negative.  No Known Allergies  Current Outpatient Prescriptions  Medication Sig Dispense Refill  . aspirin EC 81 MG tablet Take 1 tablet (81 mg total) by mouth daily.    . isosorbide dinitrate (ISORDIL) 30 MG tablet Take 1 tablet (30 mg total) by mouth 3 (three) times daily. 90 tablet 6  . lisinopril (PRINIVIL,ZESTRIL) 20 MG tablet Take 1 tablet (20 mg total) by mouth 2 (two) times daily. 60 tablet 6  . metFORMIN (GLUCOPHAGE) 500 MG tablet Take 1 tablet (500 mg total) by mouth 2 (two) times daily with a meal. 60 tablet 1  . metoprolol tartrate (LOPRESSOR) 25 MG tablet Take 1 tablet (25 mg total) by mouth 2 (two) times daily. 60 tablet 6  . nitroGLYCERIN (NITROSTAT) 0.4 MG SL tablet Place 1 tablet (0.4 mg total) under the tongue every 5 (five) minutes as needed for chest pain. 25 tablet 3    . ranolazine (RANEXA) 500 MG 12 hr tablet Take 1 tablet (500 mg total) by mouth 2 (two) times daily. 60 tablet 3  . simvastatin (ZOCOR) 40 MG tablet Take 1 tablet (40 mg total) by mouth at bedtime. 30 tablet 6   No current facility-administered medications for this visit.    Past Medical History  Diagnosis Date  . Hyperlipidemia   . Coronary artery disease   . Hypertriglyceridemia   . Dyslipidemia   . Tobacco abuse   . AMI (acute myocardial infarction) 2005  . Diabetes mellitus without complication     Past Surgical History  Procedure Laterality Date  . Lumbar spine surgery  2005  . Coronary stent placement  2005    LAD50/70, CFX OK, PL1 90, PL2 40, RCA 95->0 with 2.5 x 60 mm Taxus stent, EF 60  . Angioplasty    . Coronary angioplasty    . Coronary artery bypass graft N/A 12/31/2012    Procedure: CORONARY ARTERY BYPASS GRAFTING (CABG);  Surgeon: Melrose Nakayama, MD;  Location: [place];  Service: Open Heart Surgery;  Laterality: N/A;  times four using left internal mammary artery, left radial artery, endoscopically harvested right saphenous vein  . Radial artery harvest Left 12/31/2012    Procedure: RADIAL ARTERY HARVEST;  Surgeon: Melrose Nakayama, MD;  Location: [place];  Service: Open Heart Surgery;  Laterality: Left;  . Colonoscopy N/A 06/29/2013  Procedure: COLONOSCOPY;  Surgeon: Danie Binder, MD;  [facility];  Service: Endoscopy;  Laterality: N/A;  10:45 AM  . Left heart catheterization with coronary angiogram N/A 12/30/2012    Procedure: LEFT HEART CATHETERIZATION WITH CORONARY ANGIOGRAM;  Surgeon: Josue Hector, MD;  Location[facility];  Service: Cardiovascular;  Laterality: N/A;    History   Social History  . Marital Status: Married    Spouse Name: N/A    Number of Children: N/A  . Years of Education: N/A   Occupational History  . Disabled    Social History Main Topics  . Smoking status: Former Smoker -- 2.00 packs/day for 10 years     Types: Cigars    Start date: 06/16/1994    Quit date: 06/12/2004  . Smokeless tobacco: Never Used  . Alcohol Use: No     Comment: Occasional  . Drug Use: Yes    Special: Marijuana     Comment: Last time-02/2013  . Sexual Activity:    Partners: Female   Other Topics Concern  . Not on file   Social History Narrative   Married   Sedentary     Filed Vitals:   11/09/14 0914  BP: 131/79  Pulse: 61  Height: 5\' 10"  (1.778 m)  Weight: 240 lb (108.863 kg)  SpO2: 99%    PHYSICAL EXAM General: NAD HEENT: Normal. Neck: No JVD, no thyromegaly. Lungs: Clear to auscultation bilaterally with normal respiratory effort. CV: Nondisplaced PMI. Regular rate and rhythm, normal S1/S2, no S3/S4, soft I/VI systolic murmur over RUSB/LUSB. No pretibial or periankle edema. No carotid bruit. Normal pedal pulses.  Abdomen: Soft, nontender, no hepatosplenomegaly, no distention.  Neurologic: Alert and oriented x 3.  Psych: Normal affect. Skin: Normal. Musculoskeletal: Normal range of motion, no gross deformities. Extremities: No clubbing or cyanosis.   ECG: Most recent ECG reviewed.      ASSESSMENT AND PLAN: 1. Chest pain, dyspnea on exertion with fatigue, in the setting of an electrically abnormal stress echocardiogram with poor myocardial visualization and possible anteroseptal hypokinesis: I will continue isosorbide dinitrate 30 mg three times daily and increase Ranexa to 1000 mg twice daily. I will hold off on an invasive evaluation. Continue aspirin, beta blocker (will reduce dose to 12.5 mg bid), and statin. I will continue lisinopril 20 mg twice daily.  2. Essential hypertension: Controlled on lisinopril 20 mg twice daily. No changes. 3. Hyperlipidemia: Continue statin therapy. Results as noted above. No changes.  4. CAD/CABG and PCI: as per #1.  5. Type II diabetes mellitus: Continue 1000 mg bid as marked improvement in HbA1C over past one year. 6. Constipation with hemorrhoids:  Encouraged to see his GI provider, Dr. Oneida Alar. As he has bleeding with each bowel movement and has increased dietary fiber consumption, he may require banding.  Dispo: f/u 8 weeks  Kate Sable, M.D., F.A.C.C.

## 2014-11-09 NOTE — Patient Instructions (Signed)
   Decrease Lopressor (Metoprolol) to 12.5mg  twice a day  (take 1/2 tablet of your 25mg  that you already have)  Increase Ranexa to 1,000mg  twice a day  (may take 2 tabs of your 500mg  tablet twice a day till finish current supply) - new sent to pharm Continue all other medications.   Follow up in  8 weeks

## 2014-11-09 NOTE — Telephone Encounter (Signed)
Pharmacy did not receive, reordered ranexa

## 2015-01-07 ENCOUNTER — Encounter: Payer: Self-pay | Admitting: Cardiovascular Disease

## 2015-01-07 ENCOUNTER — Ambulatory Visit (INDEPENDENT_AMBULATORY_CARE_PROVIDER_SITE_OTHER): Payer: Medicaid Other | Admitting: Cardiovascular Disease

## 2015-01-07 VITALS — BP 143/85 | HR 62 | Ht 70.0 in | Wt 244.0 lb

## 2015-01-07 DIAGNOSIS — I25708 Atherosclerosis of coronary artery bypass graft(s), unspecified, with other forms of angina pectoris: Secondary | ICD-10-CM

## 2015-01-07 DIAGNOSIS — N528 Other male erectile dysfunction: Secondary | ICD-10-CM

## 2015-01-07 DIAGNOSIS — I10 Essential (primary) hypertension: Secondary | ICD-10-CM

## 2015-01-07 DIAGNOSIS — I480 Paroxysmal atrial fibrillation: Secondary | ICD-10-CM

## 2015-01-07 DIAGNOSIS — R079 Chest pain, unspecified: Secondary | ICD-10-CM

## 2015-01-07 DIAGNOSIS — Z951 Presence of aortocoronary bypass graft: Secondary | ICD-10-CM

## 2015-01-07 DIAGNOSIS — E119 Type 2 diabetes mellitus without complications: Secondary | ICD-10-CM

## 2015-01-07 NOTE — Patient Instructions (Signed)
Continue all current medications. Your physician wants you to follow up in: 6 months.  You will receive a reminder letter in the mail one-two months in advance.  If you don't receive a letter, please call our office to schedule the follow up appointment   

## 2015-01-07 NOTE — Progress Notes (Signed)
Patient ID: George [name], male   DOB: [birth year], 54 y.o.   MRN: [id number]      SUBJECTIVE: George [name] is here to followup for dyspnea on exertion, chest pain, hypertension, and fatigue in the setting of coronary artery disease and CABG with prior PCI. He also has diabetes and takes metformin and now Tradjenta. His chest discomfort has historically been aggravated by anxiety and other emotional stressors.  Stress echocardiogram in March 2015 was a poor quality study with possible off axis images. There may have been possible anteroseptal hypokinesis. He had reduced exercise tolerance.  HbA1C 7.7 on 8/6. Lipids on 8/6: TC 140, TG 254, HDL 32, LDL 47.  8/6 BMET: BUN 10, creatinine 1.24  His blood sugars had been running in the 230 range but was then started on Tradjenta approximately 5 days ago and his blood sugars have come down to 183 this morning. He has noticed that his chest pain has remarkably improved as well. His primary complaint today is erectile dysfunction.   Review of Systems: As per "subjective", otherwise negative.  No Known Allergies  Current Outpatient Prescriptions  Medication Sig Dispense Refill  . aspirin EC 81 MG tablet Take 1 tablet (81 mg total) by mouth daily.    . isosorbide dinitrate (ISORDIL) 30 MG tablet Take 1 tablet (30 mg total) by mouth 3 (three) times daily. 90 tablet 6  . lisinopril (PRINIVIL,ZESTRIL) 20 MG tablet Take 1 tablet (20 mg total) by mouth 2 (two) times daily. 60 tablet 6  . metFORMIN (GLUCOPHAGE) 1000 MG tablet Take 1 tablet (1,000 mg total) by mouth 2 (two) times daily with a meal.    . metoprolol tartrate (LOPRESSOR) 25 MG tablet Take 0.5 tablets (12.5 mg total) by mouth 2 (two) times daily.    . nitroGLYCERIN (NITROSTAT) 0.4 MG SL tablet Place 1 tablet (0.4 mg total) under the tongue every 5 (five) minutes as needed for chest pain. 25 tablet 3  . ranolazine (RANEXA) 1000 MG SR tablet Take 1 tablet (1,000 mg total) by mouth 2 (two) times  daily. 60 tablet 6  . simvastatin (ZOCOR) 40 MG tablet Take 1 tablet (40 mg total) by mouth at bedtime. 30 tablet 6  . TRADJENTA 5 MG TABS tablet Take 1 tablet by mouth daily.  5   No current facility-administered medications for this visit.    Past Medical History  Diagnosis Date  . Hyperlipidemia   . Coronary artery disease   . Hypertriglyceridemia   . Dyslipidemia   . Tobacco abuse   . AMI (acute myocardial infarction) 2005  . Diabetes mellitus without complication     Past Surgical History  Procedure Laterality Date  . Lumbar spine surgery  2005  . Coronary stent placement  2005    LAD50/70, CFX OK, PL1 90, PL2 40, RCA 95->0 with 2.5 x 60 mm Taxus stent, EF 60  . Angioplasty    . Coronary angioplasty    . Coronary artery bypass graft N/A 12/31/2012    Procedure: CORONARY ARTERY BYPASS GRAFTING (CABG);  Surgeon: Melrose Nakayama, MD;  Location: [place];  Service: Open Heart Surgery;  Laterality: N/A;  times four using left internal mammary artery, left radial artery, endoscopically harvested right saphenous vein  . Radial artery harvest Left 12/31/2012    Procedure: RADIAL ARTERY HARVEST;  Surgeon: Melrose Nakayama, MD;  Location: [place];  Service: Open Heart Surgery;  Laterality: Left;  . Colonoscopy N/A 06/29/2013    Procedure: COLONOSCOPY;  Surgeon:  Danie Binder, MD;  [facility];  Service: Endoscopy;  Laterality: N/A;  10:45 AM  . Left heart catheterization with coronary angiogram N/A 12/30/2012    Procedure: LEFT HEART CATHETERIZATION WITH CORONARY ANGIOGRAM;  Surgeon: Josue Hector, MD;  Location: [facility] C[facility];  Service: Cardiovascular;  Laterality: N/A;    History   Social History  . Marital Status: Married    Spouse Name: N/A  . Number of Children: N/A  . Years of Education: N/A   Occupational History  . Disabled    Social History Main Topics  . Smoking status: Former Smoker -- 2.00 packs/day for 10 years    Types: Cigars    Start date:  06/16/1994    Quit date: 06/12/2004  . Smokeless tobacco: Never Used  . Alcohol Use: No     Comment: Occasional  . Drug Use: Yes    Special: Marijuana     Comment: Last time-02/2013  . Sexual Activity:    Partners: Female   Other Topics Concern  . Not on file   Social History Narrative   Married   Sedentary     Filed Vitals:   01/07/15 1030  Height: 5\' 10"  (1.778 m)  Weight: 244 lb (110.678 kg)   BP 143/85 Pulse 62   PHYSICAL EXAM General: NAD HEENT: Normal. Neck: No JVD, no thyromegaly. Lungs: Clear to auscultation bilaterally with normal respiratory effort. CV: Nondisplaced PMI. Regular rate and rhythm, normal S1/S2, no S3/S4, soft I/VI systolic murmur over RUSB/LUSB. No pretibial or periankle edema. No carotid bruit. Normal pedal pulses.  Abdomen: Soft, nontender, no hepatosplenomegaly, no distention.  Neurologic: Alert and oriented x 3.  Psych: Normal affect. Skin: Normal. Musculoskeletal: Normal range of motion, no gross deformities. Extremities: No clubbing or cyanosis.   ECG: Most recent ECG reviewed.      ASSESSMENT AND PLAN: 1. Chest pain, dyspnea on exertion with fatigue, in the setting of an electrically abnormal stress echocardiogram with poor myocardial visualization and possible anteroseptal hypokinesis: I will continue isosorbide dinitrate 30 mg three times daily and Ranexa 1000 mg twice daily. I will hold off on an invasive evaluation. Continue aspirin, beta blocker, and statin. I will continue lisinopril 20 mg twice daily.  2. Essential hypertension: Mildly elevated on lisinopril 20 mg twice daily. No changes for now, but will continue to monitor. 3. Hyperlipidemia: Continue simvastatin 40 mg. Results as noted above. No changes.  4. CAD/CABG and PCI: as per #1.  5. Type II diabetes mellitus: Continue metformin 1000 mg bid as marked improvement in HbA1C over past one year. Also now on Tradjenta. 6. Erectile dysfunction: Usually has  intercourse in the morning two days per week. I have instructed him to not take metoprolol the night before intercourse, and to wait until after intercourse before taking his morning dose of 12.5 mg. He is agreeable with this plan.  Dispo: f/u 6 months.   Kate Sable, M.D., F.A.C.C.

## 2015-03-29 ENCOUNTER — Encounter: Payer: Self-pay | Admitting: *Deleted

## 2015-04-01 ENCOUNTER — Encounter: Payer: Self-pay | Admitting: Cardiovascular Disease

## 2015-04-01 ENCOUNTER — Encounter: Payer: Self-pay | Admitting: *Deleted

## 2015-04-01 ENCOUNTER — Telehealth: Payer: Self-pay | Admitting: Cardiovascular Disease

## 2015-04-01 ENCOUNTER — Ambulatory Visit (INDEPENDENT_AMBULATORY_CARE_PROVIDER_SITE_OTHER): Payer: Medicaid Other | Admitting: Cardiovascular Disease

## 2015-04-01 ENCOUNTER — Other Ambulatory Visit: Payer: Self-pay | Admitting: Cardiovascular Disease

## 2015-04-01 VITALS — BP 150/90 | HR 91 | Ht 71.0 in | Wt 225.0 lb

## 2015-04-01 DIAGNOSIS — I10 Essential (primary) hypertension: Secondary | ICD-10-CM

## 2015-04-01 DIAGNOSIS — Z951 Presence of aortocoronary bypass graft: Secondary | ICD-10-CM | POA: Diagnosis not present

## 2015-04-01 DIAGNOSIS — I25708 Atherosclerosis of coronary artery bypass graft(s), unspecified, with other forms of angina pectoris: Secondary | ICD-10-CM

## 2015-04-01 DIAGNOSIS — I480 Paroxysmal atrial fibrillation: Secondary | ICD-10-CM | POA: Diagnosis not present

## 2015-04-01 DIAGNOSIS — E781 Pure hyperglyceridemia: Secondary | ICD-10-CM

## 2015-04-01 DIAGNOSIS — E119 Type 2 diabetes mellitus without complications: Secondary | ICD-10-CM

## 2015-04-01 DIAGNOSIS — R5383 Other fatigue: Secondary | ICD-10-CM

## 2015-04-01 DIAGNOSIS — R079 Chest pain, unspecified: Secondary | ICD-10-CM

## 2015-04-01 DIAGNOSIS — R0600 Dyspnea, unspecified: Secondary | ICD-10-CM

## 2015-04-01 DIAGNOSIS — R9439 Abnormal result of other cardiovascular function study: Secondary | ICD-10-CM

## 2015-04-01 NOTE — Telephone Encounter (Signed)
Left heart cath - Thursday, 5/26 - 7:30 - Burt Knack

## 2015-04-01 NOTE — Patient Instructions (Signed)
Your physician has requested that you have a cardiac catheterization. Cardiac catheterization is used to diagnose and/or treat various heart conditions. Doctors may recommend this procedure for a number of different reasons. The most common reason is to evaluate chest pain. Chest pain can be a symptom of coronary artery disease (CAD), and cardiac catheterization can show whether plaque is narrowing or blocking your heart's arteries. This procedure is also used to evaluate the valves, as well as measure the blood flow and oxygen levels in different parts of your heart. For further information please visit [email or web address]. Please follow instruction sheet, as given. Continue all current medications. Follow up will be given at time of discharge from procedure above.   

## 2015-04-01 NOTE — Progress Notes (Signed)
Patient ID: George [name], male   DOB: Nov 28, 1960, 54 y.o.   MRN: [id number]      SUBJECTIVE: [name] is here to followup for dyspnea on exertion, chest pain, hypertension, and fatigue in the setting of coronary artery disease and CABG with prior PCI. He also has diabetes and takes metformin and Tradjenta. His chest discomfort has historically been aggravated by anxiety and other emotional stressors.  Stress echocardiogram in March 2015 was a poor quality study with possible off axis images. There may have been possible anteroseptal hypokinesis. He had reduced exercise tolerance.  HbA1C 7.7 on 8/6. Lipids on 8/6: TC 140, TG 254, HDL 32, LDL 47.  8/6 BMET: BUN 10, creatinine 1.24  He has exertional chest pain with activities such as sweeping the floor or even with anticipation of intercourse. 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 He states he is compliant with his medications. His blood pressure runs in the 130/80 range at home. His blood sugars have been more well controlled with morning readings of 115.  Review of Systems: As per "subjective", otherwise negative.  No Known Allergies  Current Outpatient Prescriptions  Medication Sig Dispense Refill  . aspirin EC 81 MG tablet Take 1 tablet (81 mg total) by mouth daily.    . isosorbide dinitrate (ISORDIL) 30 MG tablet Take 1 tablet (30 mg total) by mouth 3 (three) times daily. 90 tablet 6  . lisinopril (PRINIVIL,ZESTRIL) 20 MG tablet Take 1 tablet (20 mg total) by mouth 2 (two) times daily. 60 tablet 6  . metFORMIN (GLUCOPHAGE) 1000 MG tablet Take 1 tablet (1,000 mg total) by mouth 2 (two) times daily with a meal.    . metoprolol tartrate (LOPRESSOR) 25 MG tablet Take 0.5 tablets (12.5 mg total) by mouth 2 (two) times daily.    . nitroGLYCERIN (NITROSTAT) 0.4 MG SL tablet Place 1 tablet (0.4 mg total) under the tongue every 5 (five) minutes as needed for chest pain. 25 tablet 3  . ranolazine  (RANEXA) 1000 MG SR tablet Take 1 tablet (1,000 mg total) by mouth 2 (two) times daily. 60 tablet 6  . simvastatin (ZOCOR) 40 MG tablet Take 1 tablet (40 mg total) by mouth at bedtime. 30 tablet 6  . TRADJENTA 5 MG TABS tablet Take 1 tablet by mouth daily.  5   No current facility-administered medications for this visit.    Past Medical History  Diagnosis Date  . Hyperlipidemia   . Coronary artery disease   . Hypertriglyceridemia   . Dyslipidemia   . Tobacco abuse   . AMI (acute myocardial infarction) 2005  . Diabetes mellitus without complication     Past Surgical History  Procedure Laterality Date  . Lumbar spine surgery  2005  . Coronary stent placement  2005    LAD50/70, CFX OK, PL1 90, PL2 40, RCA 95->0 with 2.5 x 60 mm Taxus stent, EF 60  . Angioplasty    . Coronary angioplasty    . Coronary artery bypass graft N/A 12/31/2012    Procedure: CORONARY ARTERY BYPASS GRAFTING (CABG);  Surgeon: Melrose Nakayama, MD;  Location: [place];  Service: Open Heart Surgery;  Laterality: N/A;  times four using left internal mammary artery, left radial artery, endoscopically harvested right saphenous vein  . Radial artery harvest Left 12/31/2012    Procedure: RADIAL ARTERY HARVEST;  Surgeon: Melrose Nakayama, MD;  Location: [facility];  Service: Open Heart Surgery;  Laterality: Left;  .  Colonoscopy N/A 06/29/2013    Procedure: COLONOSCOPY;  Surgeon: Danie Binder, MD;  [facility];  Service: Endoscopy;  Laterality: N/A;  10:45 AM  . Left heart catheterization with coronary angiogram N/A 12/30/2012    Procedure: LEFT HEART CATHETERIZATION WITH CORONARY ANGIOGRAM;  Surgeon: Josue Hector, MD;  Location: [facility];  Service: Cardiovascular;  Laterality: N/A;    History   Social History  . Marital Status: Married    Spouse Name: N/A  . Number of Children: N/A  . Years of Education: N/A   Occupational History  . Disabled    Social History Main Topics  . Smoking status:  Former Smoker -- 2.00 packs/day for 10 years    Types: Cigars    Start date: 06/16/1994    Quit date: 06/12/2004  . Smokeless tobacco: Never Used  . Alcohol Use: No     Comment: Occasional  . Drug Use: Yes    Special: Marijuana     Comment: Last time-02/2013  . Sexual Activity:    Partners: Female   Other Topics Concern  . Not on file   Social History Narrative   Married   Sedentary     Filed Vitals:   04/01/15 1149  BP: 150/90  Pulse: 91  Height: 5\' 11"  (1.803 m)  Weight: 225 lb (102.059 kg)  SpO2: 98%    PHYSICAL EXAM General: NAD HEENT: Normal. Neck: No JVD, no thyromegaly. Lungs: Clear to auscultation bilaterally with normal respiratory effort. CV: Nondisplaced PMI. Regular rate and rhythm, normal S1/S2, no S3/S4, soft I/VI systolic murmur over RUSB/LUSB. No pretibial or periankle edema. No carotid bruit. Normal pedal pulses.  Abdomen: Soft, nontender, no hepatosplenomegaly, no distention.  Neurologic: Alert and oriented x 3.  Psych: Normal affect. Skin: Normal. Musculoskeletal: Normal range of motion, no gross deformities. Extremities: No clubbing or cyanosis.   ECG: Most recent ECG reviewed.      ASSESSMENT AND PLAN: 1. Chest pain, dyspnea on exertion with fatigue, in the setting of an electrically abnormal stress echocardiogram with poor myocardial visualization and possible anteroseptal hypokinesis: Given the progressive nature of his symptoms in spite of adequate dosage of nitrates and Ranexa as well as beta blockers, I will proceed with coronary angiography. Continue aspirin and simvastatin.  2. Essential hypertension: Elevated today and at last visit on lisinopril 20 mg twice daily, but well controlled at home in 130/80 range. Continue to monitor.   3. Hyperlipidemia: Continue simvastatin 40 mg. Results as noted above. No changes.   4. CAD/CABG and PCI: as per #1.   5. Type II diabetes mellitus: Continue metformin 1000 mg bid as marked  improvement in HbA1C over past one year, as well as Tradjenta.   Dispo: f/u after cath.   Kate Sable, M.D., F.A.C.C.

## 2015-04-04 NOTE — Telephone Encounter (Signed)
No precert required 

## 2015-04-07 ENCOUNTER — Ambulatory Visit (HOSPITAL_COMMUNITY)
Admission: RE | Admit: 2015-04-07 | Discharge: 2015-04-08 | Disposition: A | Discharge status: Home / Self Care | Payer: Medicaid Other | Source: Ambulatory Visit | Attending: Cardiovascular Disease | Admitting: Cardiovascular Disease

## 2015-04-07 ENCOUNTER — Encounter (HOSPITAL_COMMUNITY)
Admission: RE | Disposition: A | Discharge status: Home / Self Care | Payer: Medicaid Other | Source: Ambulatory Visit | Attending: Cardiovascular Disease

## 2015-04-07 ENCOUNTER — Encounter (HOSPITAL_COMMUNITY): Payer: Self-pay | Admitting: General Practice

## 2015-04-07 DIAGNOSIS — E785 Hyperlipidemia, unspecified: Secondary | ICD-10-CM | POA: Diagnosis present

## 2015-04-07 DIAGNOSIS — I2510 Atherosclerotic heart disease of native coronary artery without angina pectoris: Secondary | ICD-10-CM | POA: Diagnosis present

## 2015-04-07 DIAGNOSIS — I200 Unstable angina: Secondary | ICD-10-CM | POA: Diagnosis present

## 2015-04-07 DIAGNOSIS — I25110 Atherosclerotic heart disease of native coronary artery with unstable angina pectoris: Secondary | ICD-10-CM

## 2015-04-07 DIAGNOSIS — I2584 Coronary atherosclerosis due to calcified coronary lesion: Secondary | ICD-10-CM | POA: Insufficient documentation

## 2015-04-07 DIAGNOSIS — I480 Paroxysmal atrial fibrillation: Secondary | ICD-10-CM | POA: Diagnosis not present

## 2015-04-07 DIAGNOSIS — E119 Type 2 diabetes mellitus without complications: Secondary | ICD-10-CM

## 2015-04-07 DIAGNOSIS — Z87891 Personal history of nicotine dependence: Secondary | ICD-10-CM | POA: Diagnosis not present

## 2015-04-07 DIAGNOSIS — F1210 Cannabis abuse, uncomplicated: Secondary | ICD-10-CM | POA: Diagnosis not present

## 2015-04-07 DIAGNOSIS — I25119 Atherosclerotic heart disease of native coronary artery with unspecified angina pectoris: Secondary | ICD-10-CM | POA: Diagnosis not present

## 2015-04-07 DIAGNOSIS — Z951 Presence of aortocoronary bypass graft: Secondary | ICD-10-CM

## 2015-04-07 DIAGNOSIS — I2582 Chronic total occlusion of coronary artery: Secondary | ICD-10-CM | POA: Diagnosis not present

## 2015-04-07 DIAGNOSIS — I25708 Atherosclerosis of coronary artery bypass graft(s), unspecified, with other forms of angina pectoris: Secondary | ICD-10-CM

## 2015-04-07 DIAGNOSIS — I10 Essential (primary) hypertension: Secondary | ICD-10-CM | POA: Diagnosis present

## 2015-04-07 DIAGNOSIS — Z7982 Long term (current) use of aspirin: Secondary | ICD-10-CM | POA: Insufficient documentation

## 2015-04-07 DIAGNOSIS — Z79899 Other long term (current) drug therapy: Secondary | ICD-10-CM | POA: Insufficient documentation

## 2015-04-07 DIAGNOSIS — Z955 Presence of coronary angioplasty implant and graft: Secondary | ICD-10-CM | POA: Diagnosis not present

## 2015-04-07 DIAGNOSIS — I25709 Atherosclerosis of coronary artery bypass graft(s), unspecified, with unspecified angina pectoris: Secondary | ICD-10-CM | POA: Diagnosis not present

## 2015-04-07 DIAGNOSIS — F17200 Nicotine dependence, unspecified, uncomplicated: Secondary | ICD-10-CM | POA: Diagnosis present

## 2015-04-07 DIAGNOSIS — E781 Pure hyperglyceridemia: Secondary | ICD-10-CM | POA: Diagnosis not present

## 2015-04-07 DIAGNOSIS — R5383 Other fatigue: Secondary | ICD-10-CM

## 2015-04-07 HISTORY — DX: Cannabis abuse, uncomplicated: F12.10

## 2015-04-07 HISTORY — DX: Personal history of urinary calculi: Z87.442

## 2015-04-07 HISTORY — DX: Essential (primary) hypertension: I10

## 2015-04-07 HISTORY — DX: Type 2 diabetes mellitus without complications: E11.9

## 2015-04-07 HISTORY — PX: CARDIAC CATHETERIZATION: SHX172

## 2015-04-07 HISTORY — PX: PERCUTANEOUS CORONARY STENT INTERVENTION (PCI-S): SHX6016

## 2015-04-07 LAB — GLUCOSE, CAPILLARY
GLUCOSE-CAPILLARY: 141 mg/dL — AB (ref 65–99)
GLUCOSE-CAPILLARY: 149 mg/dL — AB (ref 65–99)
GLUCOSE-CAPILLARY: 119 mg/dL — AB (ref 65–99)
GLUCOSE-CAPILLARY: 113 mg/dL — AB (ref 65–99)
Glucose-Capillary: 182 mg/dL — ABNORMAL HIGH (ref 65–99)

## 2015-04-07 LAB — CBC
HCT: 43.5 % (ref 39.0–52.0)
Hemoglobin: 15 g/dL (ref 13.0–17.0)
MCH: 31.1 pg (ref 26.0–34.0)
MCHC: 34.5 g/dL (ref 30.0–36.0)
MCV: 90.2 fL (ref 78.0–100.0)
Platelets: 228 10*3/uL (ref 150–400)
RBC: 4.82 MIL/uL (ref 4.22–5.81)
RDW: 12.6 % (ref 11.5–15.5)
WBC: 8.8 10*3/uL (ref 4.0–10.5)

## 2015-04-07 LAB — POCT I-STAT 3, VENOUS BLOOD GAS (G3P V)
ACID-BASE DEFICIT: 2 mmol/L (ref 0.0–2.0)
BICARBONATE: 23.2 meq/L (ref 20.0–24.0)
O2 Saturation: 66 %
TCO2: 24 mmol/L (ref 0–100)
pCO2, Ven: 40.6 mmHg — ABNORMAL LOW (ref 45.0–50.0)
pH, Ven: 7.364 — ABNORMAL HIGH (ref 7.250–7.300)
pO2, Ven: 36 mmHg (ref 30.0–45.0)

## 2015-04-07 LAB — POCT I-STAT 3, ART BLOOD GAS (G3+)
Acid-base deficit: 3 mmol/L — ABNORMAL HIGH (ref 0.0–2.0)
BICARBONATE: 21.6 meq/L (ref 20.0–24.0)
O2 SAT: 98 %
PO2 ART: 113 mmHg — AB (ref 80.0–100.0)
TCO2: 23 mmol/L (ref 0–100)
pCO2 arterial: 37 mmHg (ref 35.0–45.0)
pH, Arterial: 7.374 (ref 7.350–7.450)

## 2015-04-07 LAB — BASIC METABOLIC PANEL
Anion gap: 12 (ref 5–15)
BUN: 13 mg/dL (ref 6–20)
CHLORIDE: 103 mmol/L (ref 101–111)
CO2: 22 mmol/L (ref 22–32)
Calcium: 9.5 mg/dL (ref 8.9–10.3)
Creatinine, Ser: 1.22 mg/dL (ref 0.61–1.24)
GFR calc Af Amer: >60 mL/min (ref >60)
GFR calc non Af Amer: >60 mL/min (ref >60)
GLUCOSE: 146 mg/dL — AB (ref 65–99)
Potassium: 4.2 mmol/L (ref 3.5–5.1)
SODIUM: 137 mmol/L (ref 135–145)

## 2015-04-07 LAB — PROTIME-INR
INR: 1.01 (ref 0.00–1.49)
Prothrombin Time: 13.5 seconds (ref 11.6–15.2)

## 2015-04-07 LAB — POCT ACTIVATED CLOTTING TIME: Activated Clotting Time: 399 seconds

## 2015-04-07 SURGERY — RIGHT/LEFT HEART CATH AND CORONARY/GRAFT ANGIOGRAPHY

## 2015-04-07 MED ORDER — RANOLAZINE ER 500 MG PO TB12
1000.0000 mg | ORAL_TABLET | Freq: Two times a day (BID) | ORAL | Status: DC
  Administered 2015-04-07 – 2015-04-08 (×3): 1000 mg via ORAL
  Filled 2015-04-07 (×4): qty 2

## 2015-04-07 MED ORDER — IOHEXOL 350 MG/ML SOLN
INTRAVENOUS | Status: DC | PRN
  Administered 2015-04-07: 185 mL via INTRA_ARTERIAL

## 2015-04-07 MED ORDER — BIVALIRUDIN 250 MG IV SOLR
INTRAVENOUS | Status: AC
  Filled 2015-04-07: qty 250

## 2015-04-07 MED ORDER — SODIUM CHLORIDE 0.9 % IJ SOLN: 3.0000 mL | Freq: Two times a day (BID) | INTRAMUSCULAR | Status: DC

## 2015-04-07 MED ORDER — NITROGLYCERIN 0.2 MG/ML ON CALL CATH LAB
INTRAVENOUS | Status: DC | PRN
  Administered 2015-04-07: 200 ug via INTRACORONARY
  Administered 2015-04-07: 0.2 mg via INTRACORONARY

## 2015-04-07 MED ORDER — MIDAZOLAM HCL 2 MG/2ML IJ SOLN
INTRAMUSCULAR | Status: AC
  Filled 2015-04-07: qty 2

## 2015-04-07 MED ORDER — SODIUM CHLORIDE 0.9 % IV SOLN: 250.0000 mL | INTRAVENOUS | Status: DC | PRN

## 2015-04-07 MED ORDER — PRASUGREL HCL 10 MG PO TABS
10.0000 mg | ORAL_TABLET | Freq: Every day | ORAL | Status: DC
  Administered 2015-04-08: 10 mg via ORAL
  Filled 2015-04-07: qty 1

## 2015-04-07 MED ORDER — ONDANSETRON HCL 4 MG/2ML IJ SOLN: 4.0000 mg | Freq: Four times a day (QID) | INTRAMUSCULAR | Status: DC | PRN

## 2015-04-07 MED ORDER — SODIUM CHLORIDE 0.9 % WEIGHT BASED INFUSION: 1.0000 mL/kg/h | INTRAVENOUS | Status: DC

## 2015-04-07 MED ORDER — ASPIRIN EC 81 MG PO TBEC
81.0000 mg | DELAYED_RELEASE_TABLET | Freq: Every day | ORAL | Status: DC
Start: 2015-04-07 — End: 2015-04-08
  Filled 2015-04-07: qty 1

## 2015-04-07 MED ORDER — ISOSORBIDE DINITRATE 10 MG PO TABS
30.0000 mg | ORAL_TABLET | Freq: Three times a day (TID) | ORAL | Status: DC
  Administered 2015-04-07 – 2015-04-08 (×2): 30 mg via ORAL
  Filled 2015-04-07 (×3): qty 3

## 2015-04-07 MED ORDER — ASPIRIN 81 MG PO CHEW
CHEWABLE_TABLET | ORAL | Status: AC
  Filled 2015-04-07: qty 1

## 2015-04-07 MED ORDER — PRASUGREL HCL 10 MG PO TABS
ORAL_TABLET | ORAL | Status: DC | PRN
  Administered 2015-04-07: 60 mg via ORAL

## 2015-04-07 MED ORDER — FENTANYL CITRATE (PF) 100 MCG/2ML IJ SOLN
INTRAMUSCULAR | Status: AC
  Filled 2015-04-07: qty 2

## 2015-04-07 MED ORDER — NITROGLYCERIN 0.4 MG SL SUBL: 0.4000 mg | SUBLINGUAL_TABLET | SUBLINGUAL | Status: DC | PRN

## 2015-04-07 MED ORDER — MORPHINE SULFATE 2 MG/ML IJ SOLN: 2.0000 mg | INTRAMUSCULAR | Status: DC | PRN

## 2015-04-07 MED ORDER — SIMVASTATIN 20 MG PO TABS
40.0000 mg | ORAL_TABLET | Freq: Every day | ORAL | Status: DC
  Administered 2015-04-07: 40 mg via ORAL
  Filled 2015-04-07: qty 2

## 2015-04-07 MED ORDER — FENTANYL CITRATE (PF) 100 MCG/2ML IJ SOLN
INTRAMUSCULAR | Status: DC | PRN
  Administered 2015-04-07 (×3): 25 ug via INTRAVENOUS

## 2015-04-07 MED ORDER — ACETAMINOPHEN 325 MG PO TABS: 650.0000 mg | ORAL_TABLET | ORAL | Status: DC | PRN

## 2015-04-07 MED ORDER — NITROGLYCERIN 1 MG/10 ML FOR IR/CATH LAB
INTRA_ARTERIAL | Status: AC
  Filled 2015-04-07: qty 10

## 2015-04-07 MED ORDER — LIDOCAINE HCL (PF) 1 % IJ SOLN
INTRAMUSCULAR | Status: DC | PRN
  Administered 2015-04-07: 20 mL via INTRADERMAL

## 2015-04-07 MED ORDER — SODIUM CHLORIDE 0.9 % IJ SOLN: 3.0000 mL | INTRAMUSCULAR | Status: DC | PRN

## 2015-04-07 MED ORDER — MIDAZOLAM HCL 2 MG/2ML IJ SOLN
INTRAMUSCULAR | Status: DC | PRN
  Administered 2015-04-07 (×3): 2 mg via INTRAVENOUS

## 2015-04-07 MED ORDER — OXYCODONE-ACETAMINOPHEN 5-325 MG PO TABS: 1.0000 | ORAL_TABLET | ORAL | Status: DC | PRN

## 2015-04-07 MED ORDER — SODIUM CHLORIDE 0.9 % WEIGHT BASED INFUSION
3.0000 mL/kg/h | INTRAVENOUS | Status: DC
  Administered 2015-04-07: 3 mL/kg/h via INTRAVENOUS

## 2015-04-07 MED ORDER — BIVALIRUDIN BOLUS VIA INFUSION - CUPID
INTRAVENOUS | Status: DC | PRN
  Administered 2015-04-07: 76.575 mg via INTRAVENOUS

## 2015-04-07 MED ORDER — SODIUM CHLORIDE 0.9 % IV SOLN: INTRAVENOUS | Status: DC

## 2015-04-07 MED ORDER — HEPARIN (PORCINE) IN NACL 2-0.9 UNIT/ML-% IJ SOLN
INTRAMUSCULAR | Status: AC
  Filled 2015-04-07: qty 1000

## 2015-04-07 MED ORDER — SODIUM CHLORIDE 0.9 % IV SOLN
250.0000 mg | INTRAVENOUS | Status: DC | PRN
  Administered 2015-04-07: 1.75 mg/kg/h via INTRAVENOUS

## 2015-04-07 MED ORDER — LIDOCAINE HCL (PF) 1 % IJ SOLN
INTRAMUSCULAR | Status: AC
  Filled 2015-04-07: qty 30

## 2015-04-07 MED ORDER — PRASUGREL HCL 10 MG PO TABS
ORAL_TABLET | ORAL | Status: AC
  Filled 2015-04-07: qty 6

## 2015-04-07 MED ORDER — METOPROLOL TARTRATE 12.5 MG HALF TABLET
12.5000 mg | ORAL_TABLET | Freq: Two times a day (BID) | ORAL | Status: DC
  Administered 2015-04-07 – 2015-04-08 (×3): 12.5 mg via ORAL
  Filled 2015-04-07 (×3): qty 1

## 2015-04-07 MED ORDER — LISINOPRIL 10 MG PO TABS
20.0000 mg | ORAL_TABLET | Freq: Two times a day (BID) | ORAL | Status: DC
  Administered 2015-04-07 – 2015-04-08 (×3): 20 mg via ORAL
  Filled 2015-04-07 (×3): qty 2

## 2015-04-07 MED ORDER — NITROGLYCERIN 0.4 MG/SPRAY TL SOLN
Status: AC
  Filled 2015-04-07: qty 4.9

## 2015-04-07 MED ORDER — LINAGLIPTIN 5 MG PO TABS
5.0000 mg | ORAL_TABLET | Freq: Every day | ORAL | Status: DC
  Administered 2015-04-08: 08:00:00 5 mg via ORAL
  Filled 2015-04-07 (×2): qty 1

## 2015-04-07 MED ORDER — ASPIRIN 81 MG PO CHEW
81.0000 mg | CHEWABLE_TABLET | ORAL | Status: AC
  Administered 2015-04-07: 81 mg via ORAL

## 2015-04-07 MED ORDER — SODIUM CHLORIDE 0.9 % IV SOLN
250.0000 mL | INTRAVENOUS | Status: DC | PRN
Start: 2015-04-07 — End: 2015-04-07

## 2015-04-07 MED ORDER — ALPRAZOLAM 0.5 MG PO TABS
0.5000 mg | ORAL_TABLET | Freq: Once | ORAL | Status: AC
  Administered 2015-04-07: 0.5 mg via ORAL
  Filled 2015-04-07: qty 1

## 2015-04-07 SURGICAL SUPPLY — 26 items
BALLN EMERGE MR 2.0X15 (BALLOONS) ×3
BALLN EUPHORA RX 2.5X15 (BALLOONS) ×3
BALLN ~~LOC~~ EMERGE MR 3.25X15 (BALLOONS) ×3
BALLOON EMERGE MR 2.0X15 (BALLOONS) ×1 IMPLANT
BALLOON EUPHORA RX 2.5X15 (BALLOONS) ×1 IMPLANT
BALLOON ~~LOC~~ EMERGE MR 3.25X15 (BALLOONS) ×1 IMPLANT
CATH INFINITI 5FR JL4 (CATHETERS) ×3 IMPLANT
CATH INFINITI 5FR MULTPACK ANG (CATHETERS) ×3 IMPLANT
CATH SWAN GANZ 7F STRAIGHT (CATHETERS) ×3 IMPLANT
CATH VISTA GUIDE 6FR XB3.5 (CATHETERS) ×3 IMPLANT
DEVICE CLOSURE PERCLS PRGLD 6F (VASCULAR PRODUCTS) ×1 IMPLANT
KIT ENCORE 26 ADVANTAGE (KITS) ×3 IMPLANT
KIT HEART LEFT (KITS) ×3 IMPLANT
KIT HEART RIGHT NAMIC (KITS) ×3 IMPLANT
PACK CARDIAC CATHETERIZATION (CUSTOM PROCEDURE TRAY) ×3 IMPLANT
PERCLOSE PROGLIDE 6F (VASCULAR PRODUCTS) ×3
SHEATH PINNACLE 5F 10CM (SHEATH) ×3 IMPLANT
SHEATH PINNACLE 6F 10CM (SHEATH) ×3 IMPLANT
SHEATH PINNACLE 7F 10CM (SHEATH) ×3 IMPLANT
STENT PROMUS PREM MR 3.0X20 (Permanent Stent) ×3 IMPLANT
SYR MEDRAD MARK V 150ML (SYRINGE) ×3 IMPLANT
TRANSDUCER W/STOPCOCK (MISCELLANEOUS) ×3 IMPLANT
TUBING CIL FLEX 10 FLL-RA (TUBING) ×3 IMPLANT
WIRE COUGAR XT STRL 190CM (WIRE) ×3 IMPLANT
WIRE EMERALD 3MM-J .035X150CM (WIRE) ×3 IMPLANT
WIRE HI TORQ WHISPER MS 190CM (WIRE) ×3 IMPLANT

## 2015-04-07 NOTE — H&P (View-Only) (Signed)
Patient ID: George [name], male   DOB: [birth year], 54 y.o.   MRN: [id number]      SUBJECTIVE: George [name] is here to followup for dyspnea on exertion, chest pain, hypertension, and fatigue in the setting of coronary artery disease and CABG with prior PCI. He also has diabetes and takes metformin and Tradjenta. His chest discomfort has historically been aggravated by anxiety and other emotional stressors.  Stress echocardiogram in March 2015 was a poor quality study with possible off axis images. There may have been possible anteroseptal hypokinesis. He had reduced exercise tolerance.  HbA1C 7.7 on 8/6. Lipids on 8/6: TC 140, TG 254, HDL 32, LDL 47.  8/6 BMET: BUN 10, creatinine 1.24  He has exertional chest pain with activities such as sweeping the floor or even with anticipation of intercourse. 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 He states he is compliant with his medications. His blood pressure runs in the 130/80 range at home. His blood sugars have been more well controlled with morning readings of 115.  Review of Systems: As per "subjective", otherwise negative.  No Known Allergies  Current Outpatient Prescriptions  Medication Sig Dispense Refill  . aspirin EC 81 MG tablet Take 1 tablet (81 mg total) by mouth daily.    . isosorbide dinitrate (ISORDIL) 30 MG tablet Take 1 tablet (30 mg total) by mouth 3 (three) times daily. 90 tablet 6  . lisinopril (PRINIVIL,ZESTRIL) 20 MG tablet Take 1 tablet (20 mg total) by mouth 2 (two) times daily. 60 tablet 6  . metFORMIN (GLUCOPHAGE) 1000 MG tablet Take 1 tablet (1,000 mg total) by mouth 2 (two) times daily with a meal.    . metoprolol tartrate (LOPRESSOR) 25 MG tablet Take 0.5 tablets (12.5 mg total) by mouth 2 (two) times daily.    . nitroGLYCERIN (NITROSTAT) 0.4 MG SL tablet Place 1 tablet (0.4 mg total) under the tongue every 5 (five) minutes as needed for chest pain. 25 tablet 3  . ranolazine  (RANEXA) 1000 MG SR tablet Take 1 tablet (1,000 mg total) by mouth 2 (two) times daily. 60 tablet 6  . simvastatin (ZOCOR) 40 MG tablet Take 1 tablet (40 mg total) by mouth at bedtime. 30 tablet 6  . TRADJENTA 5 MG TABS tablet Take 1 tablet by mouth daily.  5   No current facility-administered medications for this visit.    Past Medical History  Diagnosis Date  . Hyperlipidemia   . Coronary artery disease   . Hypertriglyceridemia   . Dyslipidemia   . Tobacco abuse   . AMI (acute myocardial infarction) 2005  . Diabetes mellitus without complication     Past Surgical History  Procedure Laterality Date  . Lumbar spine surgery  2005  . Coronary stent placement  2005    LAD50/70, CFX OK, PL1 90, PL2 40, RCA 95->0 with 2.5 x 60 mm Taxus stent, EF 60  . Angioplasty    . Coronary angioplasty    . Coronary artery bypass graft N/A 12/31/2012    Procedure: CORONARY ARTERY BYPASS GRAFTING (CABG);  Surgeon: Melrose Nakayama, MD;  Location: [place];  Service: Open Heart Surgery;  Laterality: N/A;  times four using left internal mammary artery, left radial artery, endoscopically harvested right saphenous vein  . Radial artery harvest Left 12/31/2012    Procedure: RADIAL ARTERY HARVEST;  Surgeon: Melrose Nakayama, MD;  Location: [place];  Service: Open Heart Surgery;  Laterality: Left;  .  Colonoscopy N/A 06/29/2013    Procedure: COLONOSCOPY;  Surgeon: Danie Binder, MD;  [facility];  Service: Endoscopy;  Laterality: N/A;  10:45 AM  . Left heart catheterization with coronary angiogram N/A 12/30/2012    Procedure: LEFT HEART CATHETERIZATION WITH CORONARY ANGIOGRAM;  Surgeon: Josue Hector, MD;  Location: [facility] C[facility];  Service: Cardiovascular;  Laterality: N/A;    History   Social History  . Marital Status: Married    Spouse Name: N/A  . Number of Children: N/A  . Years of Education: N/A   Occupational History  . Disabled    Social History Main Topics  . Smoking status:  Former Smoker -- 2.00 packs/day for 10 years    Types: Cigars    Start date: 06/16/1994    Quit date: 06/12/2004  . Smokeless tobacco: Never Used  . Alcohol Use: No     Comment: Occasional  . Drug Use: Yes    Special: Marijuana     Comment: Last time-02/2013  . Sexual Activity:    Partners: Female   Other Topics Concern  . Not on file   Social History Narrative   Married   Sedentary     Filed Vitals:   04/01/15 1149  BP: 150/90  Pulse: 91  Height: 5\' 11"  (1.803 m)  Weight: 225 lb (102.059 kg)  SpO2: 98%    PHYSICAL EXAM General: NAD HEENT: Normal. Neck: No JVD, no thyromegaly. Lungs: Clear to auscultation bilaterally with normal respiratory effort. CV: Nondisplaced PMI. Regular rate and rhythm, normal S1/S2, no S3/S4, soft I/VI systolic murmur over RUSB/LUSB. No pretibial or periankle edema. No carotid bruit. Normal pedal pulses.  Abdomen: Soft, nontender, no hepatosplenomegaly, no distention.  Neurologic: Alert and oriented x 3.  Psych: Normal affect. Skin: Normal. Musculoskeletal: Normal range of motion, no gross deformities. Extremities: No clubbing or cyanosis.   ECG: Most recent ECG reviewed.      ASSESSMENT AND PLAN: 1. Chest pain, dyspnea on exertion with fatigue, in the setting of an electrically abnormal stress echocardiogram with poor myocardial visualization and possible anteroseptal hypokinesis: Given the progressive nature of his symptoms in spite of adequate dosage of nitrates and Ranexa as well as beta blockers, I will proceed with coronary angiography. Continue aspirin and simvastatin.  2. Essential hypertension: Elevated today and at last visit on lisinopril 20 mg twice daily, but well controlled at home in 130/80 range. Continue to monitor.   3. Hyperlipidemia: Continue simvastatin 40 mg. Results as noted above. No changes.   4. CAD/CABG and PCI: as per #1.   5. Type II diabetes mellitus: Continue metformin 1000 mg bid as marked  improvement in HbA1C over past one year, as well as Tradjenta.   Dispo: f/u after cath.   Kate Sable, M.D., F.A.C.C.

## 2015-04-07 NOTE — Interval H&P Note (Signed)
History and Physical Interval Note:  04/07/2015 10:22 AM  George [name]  has presented today for surgery, with the diagnosis of cp, cad  The various methods of treatment have been discussed with the patient and family. After consideration of risks, benefits and other options for treatment, the patient has consented to  Procedure(s): Right/Left Heart Cath and Coronary/Graft Angiography (N/A) as a surgical intervention .  The patient's history has been reviewed, patient examined, no change in status, stable for surgery.  I have reviewed the patient's chart and labs.  Questions were answered to the patient's satisfaction.    Cath Lab Visit (complete for each Cath Lab visit)  Clinical Evaluation Leading to the Procedure:   ACS: No.  Non-ACS:    Anginal Classification: CCS III  Anti-ischemic medical therapy: Maximal Therapy (2 or more classes of medications)  Non-Invasive Test Results: No non-invasive testing performed  Prior CABG: No previous CABG       Sherren Mocha

## 2015-04-07 NOTE — Progress Notes (Signed)
Site area: right groin  Site Prior to Removal:  Level 0  Pressure Applied For 15 MINUTES    Minutes Beginning at 1415  Manual:   Yes.    Patient Status During Pull:  Stable during venous sheath pull  Post Pull Groin Site:  Level 0  Post Pull Instructions Given:  Yes.    Post Pull Pulses Present:  Yes.    Dressing Applied:  Yes.    Comments:  Tolerated well, checked frequently with no bleeding or hematoma noted

## 2015-04-07 NOTE — Progress Notes (Signed)
CM spoke to pt regarding Effient and pamphlet given with 30 day free sample card. Pt uses [facility] representative on [facility] in [place], [place] called pharmacy and confirmed medication is in stock. No other needs identified @ present time. Whitman Hero RN,BSN,CM [phone]

## 2015-04-08 ENCOUNTER — Encounter (HOSPITAL_COMMUNITY): Payer: Self-pay | Admitting: Nurse Practitioner

## 2015-04-08 DIAGNOSIS — I200 Unstable angina: Secondary | ICD-10-CM | POA: Diagnosis not present

## 2015-04-08 DIAGNOSIS — I2582 Chronic total occlusion of coronary artery: Secondary | ICD-10-CM | POA: Diagnosis not present

## 2015-04-08 DIAGNOSIS — I2584 Coronary atherosclerosis due to calcified coronary lesion: Secondary | ICD-10-CM | POA: Diagnosis not present

## 2015-04-08 DIAGNOSIS — I2510 Atherosclerotic heart disease of native coronary artery without angina pectoris: Secondary | ICD-10-CM

## 2015-04-08 DIAGNOSIS — I25709 Atherosclerosis of coronary artery bypass graft(s), unspecified, with unspecified angina pectoris: Secondary | ICD-10-CM | POA: Diagnosis not present

## 2015-04-08 DIAGNOSIS — I25119 Atherosclerotic heart disease of native coronary artery with unspecified angina pectoris: Secondary | ICD-10-CM | POA: Diagnosis not present

## 2015-04-08 LAB — BASIC METABOLIC PANEL
Anion gap: 10 (ref 5–15)
BUN: 11 mg/dL (ref 6–20)
CALCIUM: 9.4 mg/dL (ref 8.9–10.3)
CHLORIDE: 102 mmol/L (ref 101–111)
CO2: 25 mmol/L (ref 22–32)
Creatinine, Ser: 1.34 mg/dL — ABNORMAL HIGH (ref 0.61–1.24)
GFR calc non Af Amer: 59 mL/min — ABNORMAL LOW (ref >60)
GLUCOSE: 134 mg/dL — AB (ref 65–99)
Potassium: 4.4 mmol/L (ref 3.5–5.1)
SODIUM: 137 mmol/L (ref 135–145)

## 2015-04-08 LAB — CBC
HCT: 44.2 % (ref 39.0–52.0)
Hemoglobin: 15.2 g/dL (ref 13.0–17.0)
MCH: 31.2 pg (ref 26.0–34.0)
MCHC: 34.4 g/dL (ref 30.0–36.0)
MCV: 90.8 fL (ref 78.0–100.0)
Platelets: 207 10*3/uL (ref 150–400)
RBC: 4.87 MIL/uL (ref 4.22–5.81)
RDW: 12.6 % (ref 11.5–15.5)
WBC: 9.1 10*3/uL (ref 4.0–10.5)

## 2015-04-08 LAB — GLUCOSE, CAPILLARY: GLUCOSE-CAPILLARY: 141 mg/dL — AB (ref 65–99)

## 2015-04-08 MED ORDER — METFORMIN HCL 1000 MG PO TABS: 1000.0000 mg | ORAL_TABLET | Freq: Two times a day (BID) | ORAL | Status: AC

## 2015-04-08 MED ORDER — PRASUGREL HCL 10 MG PO TABS: 10.0000 mg | ORAL_TABLET | Freq: Every day | ORAL | Status: DC

## 2015-04-08 MED FILL — Lidocaine HCl Local Preservative Free (PF) Inj 1%: INTRAMUSCULAR | Qty: 30 | Status: AC

## 2015-04-08 MED FILL — Heparin Sodium (Porcine) 2 Unit/ML in Sodium Chloride 0.9%: INTRAMUSCULAR | Qty: 1000 | Status: AC

## 2015-04-08 NOTE — Discharge Summary (Signed)
Discharge Summary   Patient ID: George [name],  MRN: [id number], DOB/AGE: 1960-12-11 54 y.o.  Admit date: 04/07/2015 Discharge date: 04/08/2015  Primary Care Provider: [place] Primary Cardiologist: Court Joy, MD   Discharge Diagnoses Principal Problem:   Unstable angina pectoris  **Status post PCI and drug-eluting stent placement to the native left circumflex this admission.  Active Problems:   Coronary atherosclerosis of native coronary artery   S/P CABG (coronary artery bypass graft)   Essential hypertension, benign   Diabetes mellitus   HYPERTRIGLYCERIDEMIA   Hyperlipidemia   History of TOBACCO ABUSE   PAF (paroxysmal atrial fibrillation)   Marijuana abuse  Allergies No Known Allergies  Procedures  Cardiac Catheterization and Percutaneous Coronary Intervention 5.26.2016  Coronary Findings     Dominance: Right    Left Anterior Descending   . Prox LAD lesion, 50% stenosed. calcified, diffuse .   [name] Mid LAD lesion, 50% stenosed. diffuse .      Left Circumflex   . Prox Cx lesion, 99% stenosed. calcified .   [name] PCI: The PCI was successful with implantation of a 3.0 x 20 mm Promus premier DES postdilated with a 3.25 mm noncompliant balloon to 16 atm. There was severe distal vessel disease in the third obtuse marginal branch that is small in size and was unable to be crossed with a wire. Medical therapy recommended.   . There is no residual stenosis post intervention.     . Second Obtuse Marginal Branch   2nd [place] filled by collaterals from 3rd Sept.      Right Coronary Artery   . Mid RCA lesion, 100% stenosed.      Graft Angiography     Free Graft to RPDA  Radial artery was injected is normal in caliber, and is anatomically normal. The free radial graft to right PDA is widely patent. This graft retrograde fills the posterolateral branches of the right coronary artery.      Free LIMA Graft to Dist LAD  LIMA is normal in caliber, and is  anatomically normal. The LIMA to mid LAD is widely patent. The LAD is patent to the apex with moderate stenosis in the most apical portions of the vessel. There is a collateral supply to the left circumflex distribution.      Sequential Graft to 1st Mrg, 2nd Mrg  Sequential SVG was injected .   [name] Prox Graft lesion before 1st Mrg, 100% stenosed.            Left Heart     Left Ventricle The left ventricular size is normal. The left ventricular systolic function is normal. The left ventricular ejection fraction is 55-65% by visual estimate. There are no wall motion abnormalities in the left ventricle.  _____________   History of Present Illness  54 year old male with a prior history of coronary artery disease status post RCA stenting in 2005 and subsequent coronary artery bypass grafting. He has been experiencing intermittent chest discomfort, fatigue, and dyspnea on exertion over the past 12-18 months and was recently seen in clinic by Dr. Bronson Ing with recommendation for diagnostic catheterization.   Hospital Course  Patient presented to the cardiac catheterization laboratory at [facility] on May 26 and underwent diagnostic cardiac catheterization revealing severe native left circumflex disease with occlusive disease within the sequential vein graft to the OM1 and OM 2. Both the LIMA to the LAD and free radial artery to the right PDA were widely patent. The combination of vein  graft occlusion and native left circumflex disease was felt to be the culprit for his symptoms and thus the native left circumflex was successfully treated using a 3.0 x 20 mm Promus premier drug-eluting stent. The patient tolerated the procedure well and has not had any recurrent chest discomfort postprocedure. He has been agitated and reports not sleeping well in that setting, his blood pressures have been running high though are more stable this morning. He will be discharged home today in good condition with follow-up  arranged within the next 2 weeks.  Discharge Vitals Blood pressure 140/78, pulse 93, temperature 97.7 F (36.5 C), temperature source Oral, resp. rate 20, height 5' 10"  (1.778 m), weight 209 lb 7 oz (95 kg), SpO2 100 %.  Filed Weights   04/07/15 0534 04/08/15 0019  Weight: 225 lb (102.059 kg) 209 lb 7 oz (95 kg)   Labs  CBC  Recent Labs  04/07/15 0620 04/08/15 0537  WBC 8.8 9.1  HGB 15.0 15.2  HCT 43.5 44.2  MCV 90.2 90.8  PLT 228 017   Basic Metabolic Panel  Recent Labs  04/07/15 0620 04/08/15 0537  NA 137 137  K 4.2 4.4  CL 103 102  CO2 22 25  GLUCOSE 146* 134*  BUN 13 11  CREATININE 1.22 1.34*  CALCIUM 9.5 9.4   Disposition  Pt is being discharged home today in good condition.  Follow-up Plans & Appointments      Follow-up Information    Follow up with Herminio Commons, MD On 04/20/2015.   Specialty:  Cardiology   Why:  9:40 AM   Contact information:   [address] [place] [address] [phone]       Discharge Medications    Medication List    TAKE these medications        aspirin EC 81 MG tablet  Take 1 tablet (81 mg total) by mouth daily.     isosorbide dinitrate 30 MG tablet  Commonly known as:  ISORDIL  Take 1 tablet (30 mg total) by mouth 3 (three) times daily.     lisinopril 20 MG tablet  Commonly known as:  PRINIVIL,ZESTRIL  Take 1 tablet (20 mg total) by mouth 2 (two) times daily.     metFORMIN 1000 MG tablet  Commonly known as:  GLUCOPHAGE  Take 1 tablet (1,000 mg total) by mouth 2 (two) times daily with a meal.     metoprolol tartrate 25 MG tablet  Commonly known as:  LOPRESSOR  Take 0.5 tablets (12.5 mg total) by mouth 2 (two) times daily.     nitroGLYCERIN 0.4 MG SL tablet  Commonly known as:  NITROSTAT  Place 1 tablet (0.4 mg total) under the tongue every 5 (five) minutes as needed for chest pain.     prasugrel 10 MG Tabs tablet  Commonly known as:  EFFIENT  Take 1 tablet (10 mg total) by mouth  daily.     ranolazine 1000 MG SR tablet  Commonly known as:  RANEXA  Take 1 tablet (1,000 mg total) by mouth 2 (two) times daily.     simvastatin 40 MG tablet  Commonly known as:  ZOCOR  Take 1 tablet (40 mg total) by mouth at bedtime.     TRADJENTA 5 MG Tabs tablet  Generic drug:  linagliptin  Take 5 mg by mouth daily.       Outstanding Labs/Studies  None  Duration of Discharge Encounter   Greater than 30 minutes including  physician time.  Signed, Murray Hodgkins NP 04/08/2015, 8:20 AM     Lorretta Harp, M.D., FACP, [facility], [facility] [address] [place], [place]  [address]  [phone] 04/08/2015 9:14 AM

## 2015-04-08 NOTE — Progress Notes (Signed)
Patient Name: George [name] Date of Encounter: 04/08/2015    Principal Problem:   Unstable angina pectoris Active Problems:   Coronary atherosclerosis of native coronary artery   S/P CABG (coronary artery bypass graft)   Essential hypertension, benign   Diabetes mellitus   HYPERTRIGLYCERIDEMIA   Hyperlipidemia   SUBJECTIVE  No c/p or sob overnight. Didn't sleep well.  Admittedly agitated this morning.  BP has been running high.  Wants to leave now.  CURRENT MEDS . aspirin EC  81 mg Oral Daily  . isosorbide dinitrate  30 mg Oral TID  . linagliptin  5 mg Oral Daily  . lisinopril  20 mg Oral BID  . metoprolol tartrate  12.5 mg Oral BID  . prasugrel  10 mg Oral Daily  . ranolazine  1,000 mg Oral BID  . simvastatin  40 mg Oral QHS  . sodium chloride  3 mL Intravenous Q12H    OBJECTIVE  Filed Vitals:   04/07/15 2002 04/07/15 2028 04/08/15 0019 04/08/15 0604  BP: 196/96 139/83 132/94 162/95  Pulse: 80 74 76 88  Temp: 98.5 F (36.9 C) 97.8 F (36.6 C) 97.8 F (36.6 C) 97.1 F (36.2 C)  TempSrc: Oral Oral Oral Oral  Resp: 20 14 18 19   Height:      Weight:   209 lb 7 oz (95 kg)   SpO2: 99% 100% 97% 100%    Intake/Output Summary (Last 24 hours) at 04/08/15 0718 Last data filed at 04/08/15 8185  Gross per 24 hour  Intake    400 ml  Output    975 ml  Net   -575 ml   Filed Weights   04/07/15 0534 04/08/15 0019  Weight: 225 lb (102.059 kg) 209 lb 7 oz (95 kg)    PHYSICAL EXAM  General: Pleasant, NAD. Neuro: Alert and oriented X 3. Moves all extremities spontaneously. Psych: Normal affect. HEENT:  Normal  Neck: Supple without bruits or JVD. Lungs:  Resp regular and unlabored, CTA. Heart: RRR no s3, s4, or murmurs. Abdomen: Soft, non-tender, non-distended, BS + x 4.  Extremities: No clubbing, cyanosis or edema. DP/PT/Radials 2+ and equal bilaterally. R groin w/o bleeding/bruit/hematoma.  Accessory Clinical Findings  CBC  Recent Labs  04/07/15 0620  04/08/15 0537  WBC 8.8 9.1  HGB 15.0 15.2  HCT 43.5 44.2  MCV 90.2 90.8  PLT 228 631   Basic Metabolic Panel  Recent Labs  04/07/15 0620 04/08/15 0537  NA 137 137  K 4.2 4.4  CL 103 102  CO2 22 25  GLUCOSE 146* 134*  BUN 13 11  CREATININE 1.22 1.34*  CALCIUM 9.5 9.4   TELE  rsr sinus tach.  ECG  RSR, 83, inf q's - no acute st/t changes.  Radiology/Studies  No results found.  ASSESSMENT AND PLAN  1.  USA/CAD:  S/p cath yesterday revealing severe prox LCX dzs with occlusion of the VG OM1.  Patent LIMA  LAD and Radial PDA.  The LCX was successfully stented using a 3.0x20 Promus Premier DES.  No c/p overnight.  ECG w/o acute changes.  Ambulate this AM.  Cont asa, bb, acei, effient, nitrate, and statin.  Discussed importance of compliance.  Plan d/c today.  2.  Essential HTN:  BP variable.  Elevated this AM.  He didn't sleep well and is upset/agitated.  Says BP isn't usually this high.  F/U after AM meds, if he is willing to stay long enough (threatening to walk out now).  3.  HL:  Cont statin. Outpt f/u.  4.  DM II:  Sugars mildly elevated.  Cont tradjenta.  Hold Metformin x 48 hrs.  Signed, Murray Hodgkins NP  Pt s/p LCX stent by Dr. Billee Cashing. Groin OK. No CP. Labs OK. DAPT. OK to D/c home. ROV with Dr Burke Keels 2-3 weeks.    Agree with note by Ignacia Bayley RNP

## 2015-04-08 NOTE — Discharge Instructions (Signed)
**  PLEASE REMEMBER TO BRING ALL OF YOUR MEDICATIONS TO EACH OF YOUR FOLLOW-UP OFFICE VISITS. ° °NO HEAVY LIFTING OR SEXUAL ACTIVITY X 7 DAYS. °NO DRIVING X 3-5 DAYS. °NO SOAKING BATHS, HOT TUBS, POOLS, ETC., X 7 DAYS. ° °Groin Site Care °Refer to this sheet in the next few weeks. These instructions provide you with information on caring for yourself after your procedure. Your caregiver may also give you more specific instructions. Your treatment has been planned according to current medical practices, but problems sometimes occur. Call your caregiver if you have any problems or questions after your procedure. °HOME CARE INSTRUCTIONS °· You may shower 24 hours after the procedure. Remove the bandage (dressing) and gently wash the site with plain soap and water. Gently pat the site dry.  °· Do not apply powder or lotion to the site.  °· Do not sit in a bathtub, swimming pool, or whirlpool for 5 to 7 days.  °· No bending, squatting, or lifting anything over 10 pounds (4.5 kg) as directed by your caregiver.  °· Inspect the site at least twice daily.  °· Do not drive home if you are discharged the same day of the procedure. Have someone else drive you.  ° °What to expect: °· Any bruising will usually fade within 1 to 2 weeks.  °· Blood that collects in the tissue (hematoma) may be painful to the touch. It should usually decrease in size and tenderness within 1 to 2 weeks.  °SEEK IMMEDIATE MEDICAL CARE IF: °· You have unusual pain at the groin site or down the affected leg.  °· You have redness, warmth, swelling, or pain at the groin site.  °· You have drainage (other than a small amount of blood on the dressing).  °· You have chills.  °· You have a fever or persistent symptoms for more than 72 hours.  °· You have a fever and your symptoms suddenly get worse.  °· Your leg becomes pale, cool, tingly, or numb.  °You have heavy bleeding from the site. Hold pressure on the site. . ° °

## 2015-04-08 NOTE — Progress Notes (Signed)
CARDIAC REHAB PHASE I   PRE:  Rate/Rhythm: 84 SR    BP: sitting 140/78    SaO2:   MODE:  Ambulation: 800 ft   POST:  Rate/Rhythm: 104 ST    BP: sitting 164/90     SaO2:   Pt more relaxed than earlier sitting by window. Able to walk without problems. Sts he hasn't walked that far in greater than a year due to CP and fatigue. Ed completed although it was difficult at times to keep pt focused. Not interested in [facility], he feels he can ex on his own.  [phone]   Darrick Meigs CES, ACSM 04/08/2015 9:17 AM

## 2015-04-20 ENCOUNTER — Ambulatory Visit (INDEPENDENT_AMBULATORY_CARE_PROVIDER_SITE_OTHER): Payer: Medicaid Other | Admitting: Cardiovascular Disease

## 2015-04-20 ENCOUNTER — Encounter: Payer: Self-pay | Admitting: Cardiovascular Disease

## 2015-04-20 VITALS — BP 121/81 | HR 65 | Ht 71.0 in | Wt 225.0 lb

## 2015-04-20 DIAGNOSIS — E119 Type 2 diabetes mellitus without complications: Secondary | ICD-10-CM | POA: Diagnosis not present

## 2015-04-20 DIAGNOSIS — I10 Essential (primary) hypertension: Secondary | ICD-10-CM

## 2015-04-20 DIAGNOSIS — I25708 Atherosclerosis of coronary artery bypass graft(s), unspecified, with other forms of angina pectoris: Secondary | ICD-10-CM | POA: Diagnosis not present

## 2015-04-20 DIAGNOSIS — Z951 Presence of aortocoronary bypass graft: Secondary | ICD-10-CM

## 2015-04-20 DIAGNOSIS — Z955 Presence of coronary angioplasty implant and graft: Secondary | ICD-10-CM

## 2015-04-20 DIAGNOSIS — E785 Hyperlipidemia, unspecified: Secondary | ICD-10-CM

## 2015-04-20 NOTE — Patient Instructions (Signed)
   Stop Ranexa  Stop Imdur  Stopping above medications since you now have good blood flow to your heart.  If symptoms recur, can restart Imdur 30 daily.  Please call the office for new prescription if necessary.   Continue all other medications.   Your physician wants you to follow up in: 6 months.  You will receive a reminder letter in the mail one-two months in advance.  If you don't receive a letter, please call our office to schedule the follow up appointment

## 2015-04-20 NOTE — Addendum Note (Signed)
Addended by: Laurine Blazer on: 04/20/2015 10:07 AM   Modules accepted: Orders, Medications, Level of Service

## 2015-04-20 NOTE — Progress Notes (Signed)
Patient ID: George [name], male   DOB: 1960-11-18, 54 y.o.   MRN: [id number]      SUBJECTIVE: The patient returns after undergoing percutaneous coronary intervention of the native left circumflex order artery with a single drug-eluting stent on 04/07/15 by Dr. Burt Knack. Left ventricular systolic function was normal, LVEF 55-65%.  Coronary angiography demonstrated severe three-vessel coronary artery disease with total occlusion of the RCA, subtotal occlusion of the proximal left circumflex with 99% stenosis, and moderate diffuse 50-60% stenosis of the LAD. There was continued patency of the LIMA to LAD and free radial graft to PDA and total occlusion of the sequential saphenous graft to OM1 and OM2 (CABG was on 12/31/12). He is now on Effient.  He says he feels better than he did after bypass surgery and feels like he did 10 years ago. He denies chest pain and shortness of breath. He is grateful.  Review of Systems: As per "subjective", otherwise negative.  No Known Allergies  Current Outpatient Prescriptions  Medication Sig Dispense Refill  . aspirin EC 81 MG tablet Take 1 tablet (81 mg total) by mouth daily.    . isosorbide dinitrate (ISORDIL) 30 MG tablet Take 1 tablet (30 mg total) by mouth 3 (three) times daily. 90 tablet 6  . lisinopril (PRINIVIL,ZESTRIL) 20 MG tablet Take 1 tablet (20 mg total) by mouth 2 (two) times daily. 60 tablet 6  . metFORMIN (GLUCOPHAGE) 1000 MG tablet Take 1 tablet (1,000 mg total) by mouth 2 (two) times daily with a meal.    . metoprolol tartrate (LOPRESSOR) 25 MG tablet Take 0.5 tablets (12.5 mg total) by mouth 2 (two) times daily.    . nitroGLYCERIN (NITROSTAT) 0.4 MG SL tablet Place 1 tablet (0.4 mg total) under the tongue every 5 (five) minutes as needed for chest pain. 25 tablet 3  . prasugrel (EFFIENT) 10 MG TABS tablet Take 1 tablet (10 mg total) by mouth daily. 30 tablet 6  . ranolazine (RANEXA) 1000 MG SR tablet Take 1 tablet (1,000 mg total) by mouth 2  (two) times daily. 60 tablet 6  . simvastatin (ZOCOR) 40 MG tablet Take 1 tablet (40 mg total) by mouth at bedtime. 30 tablet 6  . TRADJENTA 5 MG TABS tablet Take 5 mg by mouth daily.   5   No current facility-administered medications for this visit.    Past Medical History  Diagnosis Date  . Hyperlipidemia   . Coronary artery disease     a. MI 2005 s/p prior RCA stenting;  b. s/p CABG x 4 (LIMA->LAD, Radial->RPDA, VG->OM1->OM2);  c. 03/2015 Cath/PCI: LAD 50p/m, LCX 99p (3.0x20 Promus Premier DES), OM2 100, RCA 143m, Radial->RPDA nl, LIMA->LAD nl, VG->OM1->OM2 100, EF 55-65%.  . Hypertriglyceridemia   . Dyslipidemia   . Tobacco abuse   . Hypertension   . Type II diabetes mellitus   . History of kidney stones   . Marijuana abuse     Past Surgical History  Procedure Laterality Date  . Lumbar spine surgery  2005  . Coronary stent placement  2005    LAD50/70, CFX OK, PL1 90, PL2 40, RCA 95->0 with 2.5 x 60 mm Taxus stent, EF 60  . Angioplasty    . Coronary angioplasty    . Coronary artery bypass graft N/A 12/31/2012    Procedure: CORONARY ARTERY BYPASS GRAFTING (CABG);  Surgeon: Melrose Nakayama, MD;  Location: [facility];  Service: Open Heart Surgery;  Laterality: N/A;  times four using left internal mammary  artery, left radial artery, endoscopically harvested right saphenous vein  . Radial artery harvest Left 12/31/2012    Procedure: RADIAL ARTERY HARVEST;  Surgeon: Melrose Nakayama, MD;  Location: [facility];  Service: Open Heart Surgery;  Laterality: Left;  . Colonoscopy N/A 06/29/2013    Procedure: COLONOSCOPY;  Surgeon: Danie Binder, MD;  [facility];  Service: Endoscopy;  Laterality: N/A;  10:45 AM  . Left heart catheterization with coronary angiogram N/A 12/30/2012    Procedure: LEFT HEART CATHETERIZATION WITH CORONARY ANGIOGRAM;  Surgeon: Josue Hector, MD;  Location: [facility];  Service: Cardiovascular;  Laterality: N/A;  . Percutaneous coronary stent intervention  (pci-s)  04/07/2015    native left circumfles with DES  . Cardiac catheterization N/A 04/07/2015    Procedure: Right/Left Heart Cath and Coronary/Graft Angiography;  Surgeon: Sherren Mocha, MD;  Location: [place] CV LAB;  Service: Cardiovascular;  Laterality: N/A;  . Cardiac catheterization N/A 04/07/2015    Procedure: Coronary Stent Intervention;  Surgeon: Sherren Mocha, MD;  Location: [facility];  Service: Cardiovascular;  Laterality: N/A;    History   Social History  . Marital Status: Married    Spouse Name: N/A  . Number of Children: N/A  . Years of Education: N/A   Occupational History  . Disabled    Social History Main Topics  . Smoking status: Former Smoker -- 2.00 packs/day for 10 years    Types: Cigars    Start date: 06/16/1994    Quit date: 06/12/2004  . Smokeless tobacco: Never Used  . Alcohol Use: No     Comment: Occasional  . Drug Use: Yes    Special: Marijuana     Comment: Last time-02/2013  . Sexual Activity:    Partners: Female   Other Topics Concern  . Not on file   Social History Narrative   Married   Sedentary     Filed Vitals:   04/20/15 0929  BP: 121/81  Pulse: 65  Height: 5\' 11"  (1.803 m)  Weight: 225 lb (102.059 kg)    PHYSICAL EXAM General: NAD HEENT: Normal. Neck: No JVD, no thyromegaly. Lungs: Clear to auscultation bilaterally with normal respiratory effort. CV: Nondisplaced PMI. Regular rate and rhythm, normal S1/S2, no S3/S4, soft I/VI systolic murmur over RUSB/LUSB. No pretibial or periankle edema. Abdomen: Soft, nontender, no distention.  Neurologic: Alert and oriented x 3.  Psych: Normal affect. Skin: Normal. Musculoskeletal: No gross deformities. Extremities: No clubbing or cyanosis.    ECG: Most recent ECG reviewed.      ASSESSMENT AND PLAN: 1. CAD s/p CABG with occluded sequential SVG to OM1 and OM2, s/p PCI of native circumflex: Symptomatically improved and doing well. Continue ASA, lisinopril,  metoprolol, Effient, and simvastatin. I will ask him to stop both Imdur and Ranexa. If symptoms were to recur, would consider restarting Imdur.  2. Essential hypertension: Well controlled. No changes.   3. Hyperlipidemia: Continue simvastatin 40 mg.   4. Type II diabetes mellitus: Continue metformin 1000 mg bid as marked improvement in HbA1C over past one year, as well as Tradjenta.   Dispo: f/u 6 months.  Kate Sable, M.D., F.A.C.C.

## 2015-05-23 ENCOUNTER — Other Ambulatory Visit: Payer: Self-pay | Admitting: *Deleted

## 2015-05-23 DIAGNOSIS — R0600 Dyspnea, unspecified: Secondary | ICD-10-CM

## 2015-05-23 DIAGNOSIS — I2510 Atherosclerotic heart disease of native coronary artery without angina pectoris: Secondary | ICD-10-CM

## 2015-05-23 DIAGNOSIS — I25708 Atherosclerosis of coronary artery bypass graft(s), unspecified, with other forms of angina pectoris: Secondary | ICD-10-CM

## 2015-05-23 DIAGNOSIS — E119 Type 2 diabetes mellitus without complications: Secondary | ICD-10-CM

## 2015-05-23 DIAGNOSIS — I10 Essential (primary) hypertension: Secondary | ICD-10-CM

## 2015-05-23 DIAGNOSIS — I480 Paroxysmal atrial fibrillation: Secondary | ICD-10-CM

## 2015-05-23 MED ORDER — SIMVASTATIN 40 MG PO TABS: 40.0000 mg | ORAL_TABLET | Freq: Every day | ORAL | Status: DC

## 2015-05-23 MED ORDER — LISINOPRIL 20 MG PO TABS: 20.0000 mg | ORAL_TABLET | Freq: Two times a day (BID) | ORAL | Status: DC

## 2015-05-23 MED ORDER — METOPROLOL TARTRATE 25 MG PO TABS: 12.5000 mg | ORAL_TABLET | Freq: Two times a day (BID) | ORAL | Status: DC

## 2015-06-29 ENCOUNTER — Ambulatory Visit: Payer: Medicaid Other | Admitting: Cardiovascular Disease

## 2015-07-29 ENCOUNTER — Ambulatory Visit (INDEPENDENT_AMBULATORY_CARE_PROVIDER_SITE_OTHER): Payer: Medicaid Other | Admitting: Adult Health

## 2015-07-29 ENCOUNTER — Encounter: Payer: Self-pay | Admitting: *Deleted

## 2015-07-29 ENCOUNTER — Encounter: Payer: Self-pay | Admitting: Adult Health

## 2015-07-29 VITALS — BP 128/74 | HR 76 | Wt 229.2 lb

## 2015-07-29 DIAGNOSIS — I2510 Atherosclerotic heart disease of native coronary artery without angina pectoris: Secondary | ICD-10-CM

## 2015-07-29 DIAGNOSIS — I10 Essential (primary) hypertension: Secondary | ICD-10-CM

## 2015-07-29 DIAGNOSIS — I350 Nonrheumatic aortic (valve) stenosis: Secondary | ICD-10-CM | POA: Diagnosis not present

## 2015-07-29 DIAGNOSIS — E7800 Pure hypercholesterolemia, unspecified: Secondary | ICD-10-CM

## 2015-07-29 DIAGNOSIS — I351 Nonrheumatic aortic (valve) insufficiency: Secondary | ICD-10-CM

## 2015-07-29 NOTE — Patient Instructions (Signed)
Your physician wants you to follow-up in: 6 months with Kathryn Lawrence, NP. You will receive a reminder letter in the mail two months in advance. If you don't receive a letter, please call our office to schedule the follow-up appointment.  Your physician recommends that you continue on your current medications as directed. Please refer to the Current Medication list given to you today.  Your physician has requested that you have an echocardiogram. Echocardiography is a painless test that uses sound waves to create images of your heart. It provides your doctor with information about the size and shape of your heart and how well your heart's chambers and valves are working. This procedure takes approximately one hour. There are no restrictions for this procedure.  Thank you for choosing [facility]!   

## 2015-07-29 NOTE — Progress Notes (Deleted)
Name: George [name]    DOB: Oct 10, 1961  Age: 54 y.o.  MR#: [id number]       PCP:  [facility]: Payor: MEDICAID [place] / Plan: MEDICAID [place] ACCESS / Product Type: *No Product type* /   CC:    Chief Complaint  Patient presents with  . Coronary Artery Disease    VS Filed Vitals:   07/29/15 1447  BP: 128/74  Pulse: 76  Weight: 229 lb 3.2 oz (103.964 kg)  SpO2: 99%    Weights Current Weight  07/29/15 229 lb 3.2 oz (103.964 kg)  04/20/15 225 lb (102.059 kg)  04/08/15 209 lb 7 oz (95 kg)    Blood Pressure  BP Readings from Last 3 Encounters:  07/29/15 128/74  04/20/15 121/81  04/08/15 140/78     Admit date:  (Not on file) Last encounter with RMR:  Visit date not found   Allergy Review of patient's allergies indicates no known allergies.  Current Outpatient Prescriptions  Medication Sig Dispense Refill  . aspirin EC 81 MG tablet Take 1 tablet (81 mg total) by mouth daily.    [name] lisinopril (PRINIVIL,ZESTRIL) 20 MG tablet Take 1 tablet (20 mg total) by mouth 2 (two) times daily. 60 tablet 6  . metFORMIN (GLUCOPHAGE) 1000 MG tablet Take 1 tablet (1,000 mg total) by mouth 2 (two) times daily with a meal.    . metoprolol tartrate (LOPRESSOR) 25 MG tablet Take 0.5 tablets (12.5 mg total) by mouth 2 (two) times daily. 30 tablet 6  . nitroGLYCERIN (NITROSTAT) 0.4 MG SL tablet Place 1 tablet (0.4 mg total) under the tongue every 5 (five) minutes as needed for chest pain. 25 tablet 3  . prasugrel (EFFIENT) 10 MG TABS tablet Take 1 tablet (10 mg total) by mouth daily. 30 tablet 6  . simvastatin (ZOCOR) 40 MG tablet Take 1 tablet (40 mg total) by mouth at bedtime. 30 tablet 6  . TRADJENTA 5 MG TABS tablet Take 5 mg by mouth daily.   5   No current facility-administered medications for this visit.    Discontinued Meds:   There are no discontinued medications.  Patient Active Problem List   Diagnosis Date Noted  . Unstable angina pectoris  04/07/2015  . S/P CABG (coronary artery bypass graft) 02/10/2014  . PAF (paroxysmal atrial fibrillation) 01/03/2013  . Diabetes mellitus 12/29/2012  . Essential hypertension, benign 02/17/2010  . HYPERTRIGLYCERIDEMIA 02/13/2010  . Hyperlipidemia 02/13/2010  . TOBACCO ABUSE 02/13/2010  . AMI 02/13/2010  . Coronary atherosclerosis of native coronary artery 02/13/2010    LABS    Component Value Date/Time   NA 137 04/08/2015 0537   NA 137 04/07/2015 0620   NA 140 06/17/2014 1016   K 4.4 04/08/2015 0537   K 4.2 04/07/2015 0620   K 4.2 06/17/2014 1016   CL 102 04/08/2015 0537   CL 103 04/07/2015 0620   CL 101 06/17/2014 1016   CO2 25 04/08/2015 0537   CO2 22 04/07/2015 0620   CO2 27 06/17/2014 1016   GLUCOSE 134* 04/08/2015 0537   GLUCOSE 146* 04/07/2015 0620   GLUCOSE 163* 06/17/2014 1016   BUN 11 04/08/2015 0537   BUN 13 04/07/2015 0620   BUN 10 06/17/2014 1016   CREATININE 1.34* 04/08/2015 0537   CREATININE 1.22 04/07/2015 0620   CREATININE 1.24 06/17/2014 1016   CREATININE 0.78 01/03/2013 0625   CALCIUM 9.4 04/08/2015 0537   CALCIUM 9.5 04/07/2015 9622  CALCIUM 9.5 06/17/2014 1016   GFRNONAA 59* 04/08/2015 0537   GFRNONAA >60 04/07/2015 0620   GFRNONAA >90 01/03/2013 0625   GFRAA >60 04/08/2015 0537   GFRAA >60 04/07/2015 0620   GFRAA >90 01/03/2013 0625   CMP     Component Value Date/Time   NA 137 04/08/2015 0537   K 4.4 04/08/2015 0537   CL 102 04/08/2015 0537   CO2 25 04/08/2015 0537   GLUCOSE 134* 04/08/2015 0537   BUN 11 04/08/2015 0537   CREATININE 1.34* 04/08/2015 0537   CREATININE 1.24 06/17/2014 1016   CALCIUM 9.4 04/08/2015 0537   PROT 7.0 06/17/2014 1016   ALBUMIN 4.5 06/17/2014 1016   AST 18 06/17/2014 1016   ALT 37 06/17/2014 1016   ALKPHOS 51 06/17/2014 1016   BILITOT 0.6 06/17/2014 1016   GFRNONAA 59* 04/08/2015 0537   GFRAA >60 04/08/2015 0537       Component Value Date/Time   WBC 9.1 04/08/2015 0537   WBC 8.8 04/07/2015 0620    WBC 12.3* 01/03/2013 0625   HGB 15.2 04/08/2015 0537   HGB 15.0 04/07/2015 0620   HGB 11.9* 01/03/2013 0625   HCT 44.2 04/08/2015 0537   HCT 43.5 04/07/2015 0620   HCT 35.1* 01/03/2013 0625   MCV 90.8 04/08/2015 0537   MCV 90.2 04/07/2015 0620   MCV 91.2 01/03/2013 0625    Lipid Panel     Component Value Date/Time   CHOL 140 06/17/2014 1016   TRIG 254* 06/17/2014 1016   HDL 32* 06/17/2014 1016   CHOLHDL 4.4 06/17/2014 1016   VLDL 51* 06/17/2014 1016   LDLCALC 57 06/17/2014 1016    ABG    Component Value Date/Time   PHART 7.374 04/07/2015 1102   PCO2ART 37.0 04/07/2015 1102   PO2ART 113.0* 04/07/2015 1102   HCO3 21.6 04/07/2015 1102   TCO2 23 04/07/2015 1102   ACIDBASEDEF 3.0* 04/07/2015 1102   O2SAT 98.0 04/07/2015 1102     No results found for: TSH BNP (last 3 results) No results for input(s): BNP in the last 8760 hours.  ProBNP (last 3 results) No results for input(s): PROBNP in the last 8760 hours.  Cardiac Panel (last 3 results) No results for input(s): CKTOTAL, CKMB, TROPONINI, RELINDX in the last 72 hours.  Iron/TIBC/Ferritin/ %Sat No results found for: IRON, TIBC, FERRITIN, IRONPCTSAT   EKG Orders placed or performed during the hospital encounter of 04/07/15  . EKG 12-Lead  . EKG 12-Lead  . EKG 12-Lead immediately post procedure  . EKG 12-Lead  . EKG 12-Lead immediately post procedure  . EKG 12-Lead  . EKG     Prior Assessment and Plan Problem List as of 07/29/2015      Cardiovascular and Mediastinum   Essential hypertension, benign   Last Assessment & Plan 01/14/2014 Office Visit Written 01/14/2014  2:25 PM by Lendon Colonel, NP    Blood pressure is well controlled currently. He will continue his current medications.       Coronary atherosclerosis of native coronary artery   Last Assessment & Plan 01/14/2014 Office Visit Written 01/14/2014  2:26 PM by Lendon Colonel, NP    He requests another stress test due to recurrent chest discomfort and  DOE. I will plan for a stress echo. He is not to take metoprolol the night before or the morning of the test. He will follow up with Dr. Bronson Ing       PAF (paroxysmal atrial fibrillation)   Last Assessment & Plan  02/20/2013 Office Visit Written 02/20/2013 10:23 AM by Josue Hector, MD    Periop post CABG Resolved continue ASA and beta blocker      AMI   Unstable angina pectoris     Endocrine   Diabetes mellitus   Last Assessment & Plan 02/20/2013 Office Visit Written 02/20/2013 10:24 AM by Josue Hector, MD    Discussed low carb diet.  Target hemoglobin A1c is 6.5 or less.  Continue current medications.         Other   Hyperlipidemia   Last Assessment & Plan 09/01/2013 Office Visit Written 09/01/2013  5:31 PM by Lendon Colonel, NP    He continues on statin therapy, and is followed by Dr. Orson Ape with recent labs within the last month. He is advised on a low cholesterol diet and to increase exercise as tolerated.      TOBACCO ABUSE   Last Assessment & Plan 09/01/2013 Office Visit Written 09/01/2013  5:32 PM by Lendon Colonel, NP    The patient has stopped smoking with rare cigarette use for which he may smoke part of the cigarette every few months. He is advised to stop smoking entirely.      S/P CABG (coronary artery bypass graft)   HYPERTRIGLYCERIDEMIA       Imaging: No results found.

## 2015-07-29 NOTE — Progress Notes (Signed)
Cardiology Office Note   Date:  07/29/2015   ID:  Hendricks, Schwandt 08-27-1961, MRN [id number]  PCP:  [place]  Cardiologist:  Woodroe Chen, NP   Chief Complaint  Patient presents with  . Coronary Artery Disease      History of Present Illness: George [name] is a 54 y.o. male who presents for ongoing assessment and management of CAD with coronary artery disease status post RCA stenting in 2005 and subsequent coronary artery bypass grafting. He has been experiencing intermittent chest discomfort, fatigue, and dyspnea on exertion He was admitted for cardiac cath May of 2016 which revealed both the LIMA to the LAD and free radial artery to the right PDA were widely patent. The combination of vein graft occlusion and native left circumflex disease was felt to be the culprit for his symptoms and thus the native left circumflex was successfully treated using a 3.0 x 20 mm Promus premier drug-eluting stent. He is here for 3 months follow up and has been undergoing cardiac rehab.[name]   He comes today without cardiac complaint. He is walking two miles daily. He is losing wt and watching his diet. He states he feels much better and is happy with his progress. He has seen his PCP who noticed his aortic murmur. The PCP requested that he be seen for further evaluation for this. Labs have been ordered by PCP and they are being requested.   Past Medical History  Diagnosis Date  . Hyperlipidemia   . Coronary artery disease     a. MI 2005 s/p prior RCA stenting;  b. s/p CABG x 4 (LIMA->LAD, Radial->RPDA, VG->OM1->OM2);  c. 03/2015 Cath/PCI: LAD 50p/m, LCX 99p (3.0x20 Promus Premier DES), OM2 100, RCA 164m, Radial->RPDA nl, LIMA->LAD nl, VG->OM1->OM2 100, EF 55-65%.  . Hypertriglyceridemia   . Dyslipidemia   . Tobacco abuse   . Hypertension   . Type II diabetes mellitus   . History of kidney stones   . Marijuana abuse     Past Surgical History  Procedure  Laterality Date  . Lumbar spine surgery  2005  . Coronary stent placement  2005    LAD50/70, CFX OK, PL1 90, PL2 40, RCA 95->0 with 2.5 x 60 mm Taxus stent, EF 60  . Angioplasty    . Coronary angioplasty    . Coronary artery bypass graft N/A 12/31/2012    Procedure: CORONARY ARTERY BYPASS GRAFTING (CABG);  Surgeon: Melrose Nakayama, MD;  Location: [facility];  Service: Open Heart Surgery;  Laterality: N/A;  times four using left internal mammary artery, left radial artery, endoscopically harvested right saphenous vein  . Radial artery harvest Left 12/31/2012    Procedure: RADIAL ARTERY HARVEST;  Surgeon: Melrose Nakayama, MD;  Location: [facility];  Service: Open Heart Surgery;  Laterality: Left;  . Colonoscopy N/A 06/29/2013    Procedure: COLONOSCOPY;  Surgeon: Danie Binder, MD;  [facility];  Service: Endoscopy;  Laterality: N/A;  10:45 AM  . Left heart catheterization with coronary angiogram N/A 12/30/2012    Procedure: LEFT HEART CATHETERIZATION WITH CORONARY ANGIOGRAM;  Surgeon: Josue Hector, MD;  Location: [facility] CATH LAB;  Service: Cardiovascular;  Laterality: N/A;  . Percutaneous coronary stent intervention (pci-s)  04/07/2015    native left circumfles with DES  . Cardiac catheterization N/A 04/07/2015    Procedure: Right/Left Heart Cath and Coronary/Graft Angiography;  Surgeon: Sherren Mocha, MD;  Location: [facility] CV LAB;  Service:  Cardiovascular;  Laterality: N/A;  . Cardiac catheterization N/A 04/07/2015    Procedure: Coronary Stent Intervention;  Surgeon: Sherren Mocha, MD;  Location: [facility] CV LAB;  Service: Cardiovascular;  Laterality: N/A;     Current Outpatient Prescriptions  Medication Sig Dispense Refill  . aspirin EC 81 MG tablet Take 1 tablet (81 mg total) by mouth daily.    [name] lisinopril (PRINIVIL,ZESTRIL) 20 MG tablet Take 1 tablet (20 mg total) by mouth 2 (two) times daily. 60 tablet 6  . metFORMIN (GLUCOPHAGE) 1000 MG tablet Take 1 tablet (1,000 mg  total) by mouth 2 (two) times daily with a meal.    . metoprolol tartrate (LOPRESSOR) 25 MG tablet Take 0.5 tablets (12.5 mg total) by mouth 2 (two) times daily. 30 tablet 6  . nitroGLYCERIN (NITROSTAT) 0.4 MG SL tablet Place 1 tablet (0.4 mg total) under the tongue every 5 (five) minutes as needed for chest pain. 25 tablet 3  . prasugrel (EFFIENT) 10 MG TABS tablet Take 1 tablet (10 mg total) by mouth daily. 30 tablet 6  . simvastatin (ZOCOR) 40 MG tablet Take 1 tablet (40 mg total) by mouth at bedtime. 30 tablet 6  . TRADJENTA 5 MG TABS tablet Take 5 mg by mouth daily.   5   No current facility-administered medications for this visit.    Allergies:   Review of patient's allergies indicates no known allergies.    Social History:  The patient  reports that he quit smoking about 11 years ago. His smoking use included Cigars. He started smoking about 21 years ago. He has never used smokeless tobacco. He reports that he uses illicit drugs (Marijuana). He reports that he does not drink alcohol.   Family History:  The patient's family history is negative for Colon cancer.    ROS: All other systems are reviewed and negative. Unless otherwise mentioned in H&P    PHYSICAL EXAM: VS:  BP 128/74 mmHg  Pulse 76  Wt 229 lb 3.2 oz (103.964 kg)  SpO2 99% , BMI Body mass index is 31.98 kg/(m^2). GEN: Well nourished, well developed, in no acute distress HEENT: normal Neck: no JVD, carotid bruits, or masses Cardiac: RRR; 1/6 systolic mumur, no rubs, or gallops,no edema  Respiratory:  clear to auscultation bilaterally, normal work of breathing GI: soft, nontender, nondistended, + BS MS: no deformity or atrophy Skin: warm and dry, no rash Neuro:  Strength and sensation are intact Psych: euthymic mood, full affect   Recent Labs: 04/08/2015: BUN 11; Creatinine, Ser 1.34*; Hemoglobin 15.2; Platelets 207; Potassium 4.4; Sodium 137    Lipid Panel    Component Value Date/Time   CHOL 140 06/17/2014  1016   TRIG 254* 06/17/2014 1016   HDL 32* 06/17/2014 1016   CHOLHDL 4.4 06/17/2014 1016   VLDL 51* 06/17/2014 1016   LDLCALC 57 06/17/2014 1016      Wt Readings from Last 3 Encounters:  07/29/15 229 lb 3.2 oz (103.964 kg)  04/20/15 225 lb (102.059 kg)  04/08/15 209 lb 7 oz (95 kg)     ASSESSMENT AND PLAN:  1. CAD -CABG: PCI to native left circumflex was treated using a 3.0 x 20 mm Promus premier drug-eluting stent, May of 2015. He is doing well and is continuing exercise regimen outside of cardiac rehab. He is medically compliant. He will continue Brilinta for one year and be considered for discontinuation after that. I would favor long term antiplatelet therapy with multiple stents in situ. Continue BB, ACE,  and ASA as well. See him again in 6 months unless symptomatic. Refills are authorized.   2. AoV stenosis: Will plan echocardiogram to evaluate this further for direction on medical management vs intervention.   3,. Hypercholesterolemia: He is on statin. Labs are being evaluated by PCP with dosage adjustments if necessary.   Current medicines are reviewed at length with the patient today.    Labs/ tests ordered today include: Echo No orders of the defined types were placed in this encounter.     Disposition:   FU with Dr. Bronson Ing in 6 months unless echo abnormal.  Signed, Jory Sims, NP  07/29/2015 2:58 PM    [facility] [address], [place], [place] [address] Phone: [phone]; Fax: [phone]

## 2015-08-03 ENCOUNTER — Ambulatory Visit (HOSPITAL_COMMUNITY)
Admission: RE | Admit: 2015-08-03 | Discharge: 2015-08-03 | Disposition: A | Discharge status: Home / Self Care | Payer: Medicaid Other | Source: Ambulatory Visit | Attending: Adult Health | Admitting: Adult Health

## 2015-08-03 DIAGNOSIS — I350 Nonrheumatic aortic (valve) stenosis: Secondary | ICD-10-CM | POA: Diagnosis not present

## 2015-11-30 ENCOUNTER — Other Ambulatory Visit: Payer: Self-pay | Admitting: Cardiovascular Disease

## 2015-12-28 ENCOUNTER — Other Ambulatory Visit: Payer: Self-pay

## 2015-12-28 DIAGNOSIS — R0600 Dyspnea, unspecified: Secondary | ICD-10-CM

## 2015-12-28 DIAGNOSIS — E119 Type 2 diabetes mellitus without complications: Secondary | ICD-10-CM

## 2015-12-28 DIAGNOSIS — I10 Essential (primary) hypertension: Secondary | ICD-10-CM

## 2015-12-28 DIAGNOSIS — I2510 Atherosclerotic heart disease of native coronary artery without angina pectoris: Secondary | ICD-10-CM

## 2015-12-28 MED ORDER — LISINOPRIL 20 MG PO TABS: 20.0000 mg | ORAL_TABLET | Freq: Two times a day (BID) | ORAL | Status: DC

## 2015-12-28 MED ORDER — SIMVASTATIN 40 MG PO TABS: 40.0000 mg | ORAL_TABLET | Freq: Every day | ORAL | Status: DC

## 2015-12-28 NOTE — Telephone Encounter (Signed)
Refill complete 

## 2016-01-30 ENCOUNTER — Encounter: Payer: Self-pay | Admitting: Adult Health

## 2016-01-30 ENCOUNTER — Ambulatory Visit (INDEPENDENT_AMBULATORY_CARE_PROVIDER_SITE_OTHER): Payer: Medicaid Other | Admitting: Adult Health

## 2016-01-30 VITALS — BP 124/78 | HR 78 | Ht 70.0 in | Wt 231.0 lb

## 2016-01-30 DIAGNOSIS — I2510 Atherosclerotic heart disease of native coronary artery without angina pectoris: Secondary | ICD-10-CM

## 2016-01-30 DIAGNOSIS — I10 Essential (primary) hypertension: Secondary | ICD-10-CM

## 2016-01-30 DIAGNOSIS — E7800 Pure hypercholesterolemia, unspecified: Secondary | ICD-10-CM | POA: Diagnosis not present

## 2016-01-30 NOTE — Progress Notes (Signed)
Cardiology Office Note   Date:  01/30/2016   ID:  George [name], George [name] [birth year], MRN [id number]  PCP:  [facility]  Cardiologist: Woodroe Chen, NP   No chief complaint on file.     History of Present Illness: George [name] is a 55 y.o. male who presents for for ongoing assessment and management of CAD with coronary artery disease status post RCA stenting in 2005 and subsequent coronary artery bypass grafting. On last office 07/29/2015.  The patient was experiencing intermittent chest discomfort, fatigue, and dyspnea on exertion.  At that office visit he was sent for catheterization, which revealed a combination of vein graft, occlusion, and native left circumflex disease, felt to be culprit for his symptoms and had a drug-eluting stent placed to the native left circumflex.  I last office visit post procedure.  He was doing well and without any cardiac complaints.  An echocardiogram was completed, due to aortic valve murmur, found by her primary care physician. Echo dated 08/03/2015.  Left ventricle: The cavity size was normal. Wall thickness was  increased in a pattern of mild LVH. Systolic function was normal.  The estimated ejection fraction was in the range of 60% to 65%.  Wall motion was normal; there were no regional wall motion  abnormalities. Left ventricular diastolic function parameters  were normal. - Aortic valve: Mildly calcified annulus. Trileaflet; mildly  thickened leaflets. Valve area (VTI): 2.58 cm^2. Valve area  (Vmax): 2.83 cm^2. - Mitral valve: Mildly calcified annulus. Normal thickness leaflets  He comes today without any cardiac complaints.  He is interested in stopping his EFFIIENT at the end of one year.  He is recovering from a chest cold, but has had no chest pain, worsening dyspnea, or fatigue.  He has had no bleeding issues.  Past Medical History  Diagnosis Date  . Hyperlipidemia   . Coronary artery disease      a. MI 2005 s/p prior RCA stenting;  b. s/p CABG x 4 (LIMA->LAD, Radial->RPDA, VG->OM1->OM2);  c. 03/2015 Cath/PCI: LAD 50p/m, LCX 99p (3.0x20 Promus Premier DES), OM2 100, RCA 148m, Radial->RPDA nl, LIMA->LAD nl, VG->OM1->OM2 100, EF 55-65%.  . Hypertriglyceridemia   . Dyslipidemia   . Tobacco abuse   . Hypertension   . Type II diabetes mellitus ([name])   . History of kidney stones   . Marijuana abuse     Past Surgical History  Procedure Laterality Date  . Lumbar spine surgery  2005  . Coronary stent placement  2005    LAD50/70, CFX OK, PL1 90, PL2 40, RCA 95->0 with 2.5 x 60 mm Taxus stent, EF 60  . Angioplasty    . Coronary angioplasty    . Coronary artery bypass graft N/A 12/31/2012    Procedure: CORONARY ARTERY BYPASS GRAFTING (CABG);  Surgeon: Melrose Nakayama, MD;  Location: [facility];  Service: Open Heart Surgery;  Laterality: N/A;  times four using left internal mammary artery, left radial artery, endoscopically harvested right saphenous vein  . Radial artery harvest Left 12/31/2012    Procedure: RADIAL ARTERY HARVEST;  Surgeon: Melrose Nakayama, MD;  Location: [facility];  Service: Open Heart Surgery;  Laterality: Left;  . Colonoscopy N/A 06/29/2013    Procedure: COLONOSCOPY;  Surgeon: Danie Binder, MD;  [facility];  Service: Endoscopy;  Laterality: N/A;  10:45 AM  . Left heart catheterization with coronary angiogram N/A 12/30/2012    Procedure: LEFT HEART CATHETERIZATION WITH CORONARY ANGIOGRAM;  Surgeon: Josue Hector, MD;  Location[facility] CATH LAB;  Service: Cardiovascular;  Laterality: N/A;  . Percutaneous coronary stent intervention (pci-s)  04/07/2015    native left circumfles with DES  . Cardiac catheterization N/A 04/07/2015    Procedure: Right/Left Heart Cath and Coronary/Graft Angiography;  Surgeon: Sherren Mocha, MD;  Location: [facility] CV LAB;  Service: Cardiovascular;  Laterality: N/A;  . Cardiac catheterization N/A 04/07/2015    Procedure: Coronary Stent  Intervention;  Surgeon: Sherren Mocha, MD;  Location: [facility] CV LAB;  Service: Cardiovascular;  Laterality: N/A;     Current Outpatient Prescriptions  Medication Sig Dispense Refill  . aspirin EC 81 MG tablet Take 1 tablet (81 mg total) by mouth daily.    [name] EFFIENT 10 MG TABS tablet TAKE 1 TABLET BY MOUTH DAILY 30 tablet 3  . lisinopril (PRINIVIL,ZESTRIL) 20 MG tablet Take 1 tablet (20 mg total) by mouth 2 (two) times daily. 60 tablet 6  . metFORMIN (GLUCOPHAGE) 1000 MG tablet Take 1 tablet (1,000 mg total) by mouth 2 (two) times daily with a meal.    . metoprolol tartrate (LOPRESSOR) 25 MG tablet Take 0.5 tablets (12.5 mg total) by mouth 2 (two) times daily. 30 tablet 6  . nitroGLYCERIN (NITROSTAT) 0.4 MG SL tablet Place 1 tablet (0.4 mg total) under the tongue every 5 (five) minutes as needed for chest pain. 25 tablet 3  . simvastatin (ZOCOR) 40 MG tablet Take 1 tablet (40 mg total) by mouth at bedtime. 30 tablet 6  . TRADJENTA 5 MG TABS tablet Take 5 mg by mouth daily.   5   No current facility-administered medications for this visit.    Allergies:   Review of patient's allergies indicates no known allergies.    Social History:  The patient  reports that he quit smoking about 11 years ago. His smoking use included Cigars. He started smoking about 21 years ago. He has never used smokeless tobacco. He reports that he uses illicit drugs (Marijuana). He reports that he does not drink alcohol.   Family History:  The patient's family history is negative for Colon cancer.    ROS: All other systems are reviewed and negative. Unless otherwise mentioned in H&P    PHYSICAL EXAM: VS:  BP 124/78 mmHg  Pulse 78  Ht 5\' 10"  (1.778 m)  Wt 231 lb (104.781 kg)  BMI 33.15 kg/m2  SpO2 98% , BMI Body mass index is 33.15 kg/(m^2). GEN: Well nourished, well developed, in no acute distress HEENT: normal Neck: no JVD, carotid bruits, or masses Cardiac: [id number] systolic murmur at the apex edema   Respiratory:  clear to auscultation bilaterally, normal work of breathing GI: soft, nontender, nondistended, + BS MS: no deformity or atrophy Skin: warm and dry, no rash Neuro:  Strength and sensation are intact Psych: euthymic mood, full affect   EKG:  The ekg ordered today demonstrates normal sinus rhythm, rate of 82 beats per minute.   Recent Labs: 04/08/2015: BUN 11; Creatinine, Ser 1.34*; Hemoglobin 15.2; Platelets 207; Potassium 4.4; Sodium 137    Lipid Panel    Component Value Date/Time   CHOL 140 06/17/2014 1016   TRIG 254* 06/17/2014 1016   HDL 32* 06/17/2014 1016   CHOLHDL 4.4 06/17/2014 1016   VLDL 51* 06/17/2014 1016   LDLCALC 57 06/17/2014 1016      Wt Readings from Last 3 Encounters:  01/30/16 231 lb (104.781 kg)  07/29/15 229 lb 3.2 oz (103.964 kg)  04/20/15 225  lb (102.059 kg)      ASSESSMENT AND PLAN:  1.  Coronary artery disease: Status post coronary artery bypass grafting, with drug-eluting stent to the native circumflex.  This was completed in May of 2016.  He will continue ACE inhibitor, metoprolol, simvastatin.  He will stop his FEN at the end of May 2017.  He no longer wishes to take it any longer than necessary.  Will try this.  If he has another event and he will need to continue this for life.  But for now will try taking him off it after the one year time of endothelializing.  2. Hypertension:very well controlled on ACE inhibitor.  As he is a diabetic.  He is within normal limits.  He will need to have followup labs will be seeing his primary care provider in 2 weeks.  I requested results.  3. Hypercholesterolemia:continue statin therapy.  We will request a copy of labs once they are drawn in 2 weeks by primary care.   Current medicines are reviewed at length with the patient today.    Labs/ tests ordered today include: none.  Requesting copies  Orders Placed This Encounter  Procedures  . EKG 12-Lead     Disposition:   FU with 6  months  Signed, Jory Sims, NP  01/30/2016 3:58 PM    [facility] [address] [place], [place] [address] Phone: [phone]; Fax: [phone]

## 2016-01-30 NOTE — Progress Notes (Deleted)
Name: George [name]    DOB: 09/22/1961  Age: 55 y.o.  MR#: [id number]       PCP:  [facility]: Payor: MEDICAID [place] / Plan: MEDICAID  ACCESS / Product Type: *No Product type* /   CC:   No chief complaint on file.   VS Filed Vitals:   01/30/16 1523  BP: 124/78  Pulse: 78  Height: 5\' 10"  (1.778 m)  Weight: 231 lb (104.781 kg)  SpO2: 98%    Weights Current Weight  01/30/16 231 lb (104.781 kg)  07/29/15 229 lb 3.2 oz (103.964 kg)  04/20/15 225 lb (102.059 kg)    Blood Pressure  BP Readings from Last 3 Encounters:  01/30/16 124/78  07/29/15 128/74  04/20/15 121/81     Admit date:  (Not on file) Last encounter with RMR:  Visit date not found   Allergy Review of patient's allergies indicates no known allergies.  Current Outpatient Prescriptions  Medication Sig Dispense Refill  . aspirin EC 81 MG tablet Take 1 tablet (81 mg total) by mouth daily.    [name] EFFIENT 10 MG TABS tablet TAKE 1 TABLET BY MOUTH DAILY 30 tablet 3  . lisinopril (PRINIVIL,ZESTRIL) 20 MG tablet Take 1 tablet (20 mg total) by mouth 2 (two) times daily. 60 tablet 6  . metFORMIN (GLUCOPHAGE) 1000 MG tablet Take 1 tablet (1,000 mg total) by mouth 2 (two) times daily with a meal.    . metoprolol tartrate (LOPRESSOR) 25 MG tablet Take 0.5 tablets (12.5 mg total) by mouth 2 (two) times daily. 30 tablet 6  . nitroGLYCERIN (NITROSTAT) 0.4 MG SL tablet Place 1 tablet (0.4 mg total) under the tongue every 5 (five) minutes as needed for chest pain. 25 tablet 3  . simvastatin (ZOCOR) 40 MG tablet Take 1 tablet (40 mg total) by mouth at bedtime. 30 tablet 6  . TRADJENTA 5 MG TABS tablet Take 5 mg by mouth daily.   5   No current facility-administered medications for this visit.    Discontinued Meds:   There are no discontinued medications.  Patient Active Problem List   Diagnosis Date Noted  . Unstable angina pectoris ([place]) 04/07/2015  . S/P CABG (coronary artery bypass  graft) 02/10/2014  . PAF (paroxysmal atrial fibrillation) ([place]) 01/03/2013  . Diabetes mellitus ([place]) 12/29/2012  . Essential hypertension, benign 02/17/2010  . HYPERTRIGLYCERIDEMIA 02/13/2010  . Hyperlipidemia 02/13/2010  . TOBACCO ABUSE 02/13/2010  . AMI 02/13/2010  . Coronary atherosclerosis of native coronary artery 02/13/2010    LABS    Component Value Date/Time   NA 137 04/08/2015 0537   NA 137 04/07/2015 0620   NA 140 06/17/2014 1016   K 4.4 04/08/2015 0537   K 4.2 04/07/2015 0620   K 4.2 06/17/2014 1016   CL 102 04/08/2015 0537   CL 103 04/07/2015 0620   CL 101 06/17/2014 1016   CO2 25 04/08/2015 0537   CO2 22 04/07/2015 0620   CO2 27 06/17/2014 1016   GLUCOSE 134* 04/08/2015 0537   GLUCOSE 146* 04/07/2015 0620   GLUCOSE 163* 06/17/2014 1016   BUN 11 04/08/2015 0537   BUN 13 04/07/2015 0620   BUN 10 06/17/2014 1016   CREATININE 1.34* 04/08/2015 0537   CREATININE 1.22 04/07/2015 0620   CREATININE 1.24 06/17/2014 1016   CREATININE 0.78 01/03/2013 0625   CALCIUM 9.4 04/08/2015 0537   CALCIUM 9.5 04/07/2015 0620   CALCIUM 9.5 06/17/2014 1016  GFRNONAA 59* 04/08/2015 0537   GFRNONAA >60 04/07/2015 0620   GFRNONAA >90 01/03/2013 0625   GFRAA >60 04/08/2015 0537   GFRAA >60 04/07/2015 0620   GFRAA >90 01/03/2013 0625   CMP     Component Value Date/Time   NA 137 04/08/2015 0537   K 4.4 04/08/2015 0537   CL 102 04/08/2015 0537   CO2 25 04/08/2015 0537   GLUCOSE 134* 04/08/2015 0537   BUN 11 04/08/2015 0537   CREATININE 1.34* 04/08/2015 0537   CREATININE 1.24 06/17/2014 1016   CALCIUM 9.4 04/08/2015 0537   PROT 7.0 06/17/2014 1016   ALBUMIN 4.5 06/17/2014 1016   AST 18 06/17/2014 1016   ALT 37 06/17/2014 1016   ALKPHOS 51 06/17/2014 1016   BILITOT 0.6 06/17/2014 1016   GFRNONAA 59* 04/08/2015 0537   GFRAA >60 04/08/2015 0537       Component Value Date/Time   WBC 9.1 04/08/2015 0537   WBC 8.8 04/07/2015 0620   WBC 12.3* 01/03/2013 0625   HGB 15.2  04/08/2015 0537   HGB 15.0 04/07/2015 0620   HGB 11.9* 01/03/2013 0625   HCT 44.2 04/08/2015 0537   HCT 43.5 04/07/2015 0620   HCT 35.1* 01/03/2013 0625   MCV 90.8 04/08/2015 0537   MCV 90.2 04/07/2015 0620   MCV 91.2 01/03/2013 0625    Lipid Panel     Component Value Date/Time   CHOL 140 06/17/2014 1016   TRIG 254* 06/17/2014 1016   HDL 32* 06/17/2014 1016   CHOLHDL 4.4 06/17/2014 1016   VLDL 51* 06/17/2014 1016   LDLCALC 57 06/17/2014 1016    ABG    Component Value Date/Time   PHART 7.374 04/07/2015 1102   PCO2ART 37.0 04/07/2015 1102   PO2ART 113.0* 04/07/2015 1102   HCO3 21.6 04/07/2015 1102   TCO2 23 04/07/2015 1102   ACIDBASEDEF 3.0* 04/07/2015 1102   O2SAT 98.0 04/07/2015 1102     No results found for: TSH BNP (last 3 results) No results for input(s): BNP in the last 8760 hours.  ProBNP (last 3 results) No results for input(s): PROBNP in the last 8760 hours.  Cardiac Panel (last 3 results) No results for input(s): CKTOTAL, CKMB, TROPONINI, RELINDX in the last 72 hours.  Iron/TIBC/Ferritin/ %Sat No results found for: IRON, TIBC, FERRITIN, IRONPCTSAT   EKG Orders placed or performed in visit on 01/30/16  . EKG 12-Lead     Prior Assessment and Plan Problem List as of 01/30/2016      Cardiovascular and Mediastinum   Essential hypertension, benign   Last Assessment & Plan 01/14/2014 Office Visit Written 01/14/2014  2:25 PM by Lendon Colonel, NP    Blood pressure is well controlled currently. He will continue his current medications.       Coronary atherosclerosis of native coronary artery   Last Assessment & Plan 01/14/2014 Office Visit Written 01/14/2014  2:26 PM by Lendon Colonel, NP    He requests another stress test due to recurrent chest discomfort and DOE. I will plan for a stress echo. He is not to take metoprolol the night before or the morning of the test. He will follow up with Dr. Bronson Ing       PAF (paroxysmal atrial fibrillation) [facility])    Last Assessment & Plan 02/20/2013 Office Visit Written 02/20/2013 10:23 AM by Josue Hector, MD    Periop post CABG Resolved continue ASA and beta blocker      AMI   Unstable angina pectoris ([place])  Endocrine   Diabetes mellitus [facility])   Last Assessment & Plan 02/20/2013 Office Visit Written 02/20/2013 10:24 AM by Josue Hector, MD    Discussed low carb diet.  Target hemoglobin A1c is 6.5 or less.  Continue current medications.         Other   Hyperlipidemia   Last Assessment & Plan 09/01/2013 Office Visit Written 09/01/2013  5:31 PM by Lendon Colonel, NP    He continues on statin therapy, and is followed by Dr. Orson Ape with recent labs within the last month. He is advised on a low cholesterol diet and to increase exercise as tolerated.      TOBACCO ABUSE   Last Assessment & Plan 09/01/2013 Office Visit Written 09/01/2013  5:32 PM by Lendon Colonel, NP    The patient has stopped smoking with rare cigarette use for which he may smoke part of the cigarette every few months. He is advised to stop smoking entirely.      S/P CABG (coronary artery bypass graft)   HYPERTRIGLYCERIDEMIA       Imaging: No results found.

## 2016-01-30 NOTE — Patient Instructions (Signed)
Your physician wants you to follow-up in: 6 months You will receive a reminder letter in the mail two months in advance. If you don't receive a letter, please call our office to schedule the follow-up appointment.  You may STOP Effient June 1st   Continue taking all your other medications     Thank you for choosing [facility] !

## 2016-05-14 ENCOUNTER — Encounter: Payer: Self-pay | Admitting: Gastroenterology

## 2016-07-30 ENCOUNTER — Ambulatory Visit (INDEPENDENT_AMBULATORY_CARE_PROVIDER_SITE_OTHER): Payer: Medicaid Other | Admitting: Adult Health

## 2016-07-30 ENCOUNTER — Encounter: Payer: Self-pay | Admitting: Adult Health

## 2016-07-30 VITALS — BP 142/88 | HR 88 | Ht 70.0 in | Wt 225.0 lb

## 2016-07-30 DIAGNOSIS — I2510 Atherosclerotic heart disease of native coronary artery without angina pectoris: Secondary | ICD-10-CM | POA: Diagnosis not present

## 2016-07-30 DIAGNOSIS — E119 Type 2 diabetes mellitus without complications: Secondary | ICD-10-CM

## 2016-07-30 DIAGNOSIS — I10 Essential (primary) hypertension: Secondary | ICD-10-CM

## 2016-07-30 DIAGNOSIS — I480 Paroxysmal atrial fibrillation: Secondary | ICD-10-CM

## 2016-07-30 DIAGNOSIS — R0600 Dyspnea, unspecified: Secondary | ICD-10-CM

## 2016-07-30 MED ORDER — NITROGLYCERIN 0.4 MG SL SUBL: 0.4000 mg | SUBLINGUAL_TABLET | SUBLINGUAL | 3 refills | Status: DC | PRN

## 2016-07-30 MED ORDER — LISINOPRIL 20 MG PO TABS: 20.0000 mg | ORAL_TABLET | Freq: Two times a day (BID) | ORAL | 6 refills | Status: DC

## 2016-07-30 MED ORDER — SIMVASTATIN 40 MG PO TABS: 40.0000 mg | ORAL_TABLET | Freq: Every day | ORAL | 6 refills | Status: DC

## 2016-07-30 MED ORDER — METOPROLOL TARTRATE 25 MG PO TABS: 12.5000 mg | ORAL_TABLET | Freq: Two times a day (BID) | ORAL | 6 refills | Status: DC

## 2016-07-30 NOTE — Patient Instructions (Signed)
Your physician wants you to follow-up in: 6 Months with Dr. Bronson Ing. You will receive a reminder letter in the mail two months in advance. If you don't receive a letter, please call our office to schedule the follow-up appointment.  Your physician recommends that you continue on your current medications as directed. Please refer to the Current Medication list given to you today.  Your physician recommends that you return for lab work in: Today  If you need a refill on your cardiac medications before your next appointment, please call your pharmacy.  Thank you for choosing [facility]!

## 2016-07-30 NOTE — Progress Notes (Signed)
Name: George [name]    DOB: 02/27/1961  Age: 55 y.o.  MR#: [id number]       PCP:  No PCP Per Patient      Insurance: Payor: MEDICAID [place] / Plan: MEDICAID OF [place] / Product Type: *No Product type* /   CC:    Chief Complaint  Patient presents with  . Coronary Artery Disease    VS Vitals:   07/30/16 1309  BP: (!) 142/88  Pulse: 88  SpO2: 95%  Weight: 225 lb (102.1 kg)  Height: 5\' 10"  (1.778 m)    Weights Current Weight  07/30/16 225 lb (102.1 kg)  01/30/16 231 lb (104.8 kg)  07/29/15 229 lb 3.2 oz (104 kg)    Blood Pressure  BP Readings from Last 3 Encounters:  07/30/16 (!) 142/88  01/30/16 124/78  07/29/15 128/74     Admit date:  (Not on file) Last encounter with RMR:  Visit date not found   Allergy Review of patient's allergies indicates no known allergies.  Current Outpatient Prescriptions  Medication Sig Dispense Refill  . aspirin EC 81 MG tablet Take 1 tablet (81 mg total) by mouth daily.    [name] lisinopril (PRINIVIL,ZESTRIL) 20 MG tablet Take 1 tablet (20 mg total) by mouth 2 (two) times daily. 60 tablet 6  . metFORMIN (GLUCOPHAGE) 1000 MG tablet Take 1 tablet (1,000 mg total) by mouth 2 (two) times daily with a meal.    . nitroGLYCERIN (NITROSTAT) 0.4 MG SL tablet Place 1 tablet (0.4 mg total) under the tongue every 5 (five) minutes as needed for chest pain. 25 tablet 3  . simvastatin (ZOCOR) 40 MG tablet Take 1 tablet (40 mg total) by mouth at bedtime. 30 tablet 6   No current facility-administered medications for this visit.     Discontinued Meds:    Medications Discontinued During This Encounter  Medication Reason  . EFFIENT 10 MG TABS tablet Error  . metoprolol tartrate (LOPRESSOR) 25 MG tablet Error  . TRADJENTA 5 MG TABS tablet Error    Patient Active Problem List   Diagnosis Date Noted  . Unstable angina pectoris ([place]) 04/07/2015  . S/P CABG (coronary artery bypass graft) 02/10/2014  . PAF (paroxysmal atrial fibrillation) ([place]) 01/03/2013  . Diabetes  mellitus ([place]) 12/29/2012  . Essential hypertension, benign 02/17/2010  . HYPERTRIGLYCERIDEMIA 02/13/2010  . Hyperlipidemia 02/13/2010  . TOBACCO ABUSE 02/13/2010  . AMI 02/13/2010  . Coronary atherosclerosis of native coronary artery 02/13/2010    LABS    Component Value Date/Time   NA 137 04/08/2015 0537   NA 137 04/07/2015 0620   NA 140 06/17/2014 1016   K 4.4 04/08/2015 0537   K 4.2 04/07/2015 0620   K 4.2 06/17/2014 1016   CL 102 04/08/2015 0537   CL 103 04/07/2015 0620   CL 101 06/17/2014 1016   CO2 25 04/08/2015 0537   CO2 22 04/07/2015 0620   CO2 27 06/17/2014 1016   GLUCOSE 134 (H) 04/08/2015 0537   GLUCOSE 146 (H) 04/07/2015 0620   GLUCOSE 163 (H) 06/17/2014 1016   BUN 11 04/08/2015 0537   BUN 13 04/07/2015 0620   BUN 10 06/17/2014 1016   CREATININE 1.34 (H) 04/08/2015 0537   CREATININE 1.22 04/07/2015 0620   CREATININE 1.24 06/17/2014 1016   CREATININE 0.78 01/03/2013 0625   CALCIUM 9.4 04/08/2015 0537   CALCIUM 9.5 04/07/2015 0620   CALCIUM 9.5 06/17/2014 1016   GFRNONAA 59 (L) 04/08/2015 0537   GFRNONAA >60 04/07/2015 [id number]  GFRNONAA >90 01/03/2013 0625   GFRAA >60 04/08/2015 0537   GFRAA >60 04/07/2015 0620   GFRAA >90 01/03/2013 0625   CMP     Component Value Date/Time   NA 137 04/08/2015 0537   K 4.4 04/08/2015 0537   CL 102 04/08/2015 0537   CO2 25 04/08/2015 0537   GLUCOSE 134 (H) 04/08/2015 0537   BUN 11 04/08/2015 0537   CREATININE 1.34 (H) 04/08/2015 0537   CREATININE 1.24 06/17/2014 1016   CALCIUM 9.4 04/08/2015 0537   PROT 7.0 06/17/2014 1016   ALBUMIN 4.5 06/17/2014 1016   AST 18 06/17/2014 1016   ALT 37 06/17/2014 1016   ALKPHOS 51 06/17/2014 1016   BILITOT 0.6 06/17/2014 1016   GFRNONAA 59 (L) 04/08/2015 0537   GFRAA >60 04/08/2015 0537       Component Value Date/Time   WBC 9.1 04/08/2015 0537   WBC 8.8 04/07/2015 0620   WBC 12.3 (H) 01/03/2013 0625   HGB 15.2 04/08/2015 0537   HGB 15.0 04/07/2015 0620   HGB 11.9 (L)  01/03/2013 0625   HCT 44.2 04/08/2015 0537   HCT 43.5 04/07/2015 0620   HCT 35.1 (L) 01/03/2013 0625   MCV 90.8 04/08/2015 0537   MCV 90.2 04/07/2015 0620   MCV 91.2 01/03/2013 0625    Lipid Panel     Component Value Date/Time   CHOL 140 06/17/2014 1016   TRIG 254 (H) 06/17/2014 1016   HDL 32 (L) 06/17/2014 1016   CHOLHDL 4.4 06/17/2014 1016   VLDL 51 (H) 06/17/2014 1016   LDLCALC 57 06/17/2014 1016    ABG    Component Value Date/Time   PHART 7.374 04/07/2015 1102   PCO2ART 37.0 04/07/2015 1102   PO2ART 113.0 (H) 04/07/2015 1102   HCO3 21.6 04/07/2015 1102   TCO2 23 04/07/2015 1102   ACIDBASEDEF 3.0 (H) 04/07/2015 1102   O2SAT 98.0 04/07/2015 1102     No results found for: TSH BNP (last 3 results) No results for input(s): BNP in the last 8760 hours.  ProBNP (last 3 results) No results for input(s): PROBNP in the last 8760 hours.  Cardiac Panel (last 3 results) No results for input(s): CKTOTAL, CKMB, TROPONINI, RELINDX in the last 72 hours.  Iron/TIBC/Ferritin/ %Sat No results found for: IRON, TIBC, FERRITIN, IRONPCTSAT   EKG Orders placed or performed in visit on 01/30/16  . EKG 12-Lead     Prior Assessment and Plan Problem List as of 07/30/2016 Reviewed: 01/30/2016  4:00 PM by Jory Sims, NP     Cardiovascular and Mediastinum   Essential hypertension, benign   Last Assessment & Plan 01/14/2014 Office Visit Written 01/14/2014  2:25 PM by Lendon Colonel, NP    Blood pressure is well controlled currently. He will continue his current medications.       Coronary atherosclerosis of native coronary artery   Last Assessment & Plan 01/14/2014 Office Visit Written 01/14/2014  2:26 PM by Lendon Colonel, NP    He requests another stress test due to recurrent chest discomfort and DOE. I will plan for a stress echo. He is not to take metoprolol the night before or the morning of the test. He will follow up with Dr. Bronson Ing       PAF (paroxysmal atrial  fibrillation) [facility])   Last Assessment & Plan 02/20/2013 Office Visit Written 02/20/2013 10:23 AM by Josue Hector, MD    Periop post CABG Resolved continue ASA and beta blocker  AMI   Unstable angina pectoris [facility])     Endocrine   Diabetes mellitus [facility])   Last Assessment & Plan 02/20/2013 Office Visit Written 02/20/2013 10:24 AM by Josue Hector, MD    Discussed low carb diet.  Target hemoglobin A1c is 6.5 or less.  Continue current medications.         Other   Hyperlipidemia   Last Assessment & Plan 09/01/2013 Office Visit Written 09/01/2013  5:31 PM by Lendon Colonel, NP    He continues on statin therapy, and is followed by Dr. Orson Ape with recent labs within the last month. He is advised on a low cholesterol diet and to increase exercise as tolerated.      TOBACCO ABUSE   Last Assessment & Plan 09/01/2013 Office Visit Written 09/01/2013  5:32 PM by Lendon Colonel, NP    The patient has stopped smoking with rare cigarette use for which he may smoke part of the cigarette every few months. He is advised to stop smoking entirely.      S/P CABG (coronary artery bypass graft)   HYPERTRIGLYCERIDEMIA       Imaging: No results found.

## 2016-07-30 NOTE — Progress Notes (Signed)
Cardiology Office Note   Date:  07/30/2016   ID:  George [name], George [name] 01/11/1961, MRN [id number]  PCP:  No PCP Per Patient  Cardiologist: Woodroe Chen, NP   Chief Complaint  Patient presents with  . Coronary Artery Disease      History of Present Illness: George [name] is a 55 y.o. male who presents for ongoing assessment and management of coronary artery disease, history of RCA stenting in 2005, and subsequent coronary artery bypass grafting.subsequent coronary artery bypass grafting. He has been experiencing intermittent chest discomfort, fatigue, and dyspnea on exertion He was admitted for cardiac cath May of 2016 which revealed both the LIMA to the LAD and free radial artery to the right PDA were widely patent. The combination of vein graft occlusion and native left circumflex disease was felt to be the culprit for his symptoms and thus the native left circumflex was successfully treated using a 3.0 x 20 mm Promus premier drug-eluting stent.  Was last seen September 2016, was doing well without any complaints. Exercising regularly. He was continued on his current medication regimen. If follow-up echocardiogram was ordered to evaluate aortic valve stenosis.  Echocardiogram 08/03/2015 Left ventricle: The cavity size was normal. Wall thickness was   increased in a pattern of mild LVH. Systolic function was normal.   The estimated ejection fraction was in the range of 60% to 65%.   Wall motion was normal; there were no regional wall motion   abnormalities. Left ventricular diastolic function parameters   were normal. - Aortic valve: Mildly calcified annulus. Trileaflet; mildly   thickened leaflets. Valve area (VTI): 2.58 cm^2. Valve area   (Vmax): 2.83 cm^2. - Mitral valve: Mildly calcified annulus. Normal thickness leaflets   .Right ventricle: The cavity size was mildly dilated. - Atrial septum: No defect or patent foramen ovale was identified. - Technically adequate  study.  He has stopped his Effient, and has not taken metoprolol since last office visit. He has constant diaphoresis. Denies chest pain, dyspnea or fatigue.   Past Medical History:  Diagnosis Date  . Coronary artery disease    a. MI 2005 s/p prior RCA stenting;  b. s/p CABG x 4 (LIMA->LAD, Radial->RPDA, VG->OM1->OM2);  c. 03/2015 Cath/PCI: LAD 50p/m, LCX 99p (3.0x20 Promus Premier DES), OM2 100, RCA 121m, Radial->RPDA nl, LIMA->LAD nl, VG->OM1->OM2 100, EF 55-65%.  . Dyslipidemia   . History of kidney stones   . Hyperlipidemia   . Hypertension   . Hypertriglyceridemia   . Marijuana abuse   . Tobacco abuse   . Type II diabetes mellitus ([place])     Past Surgical History:  Procedure Laterality Date  . ANGIOPLASTY    . CARDIAC CATHETERIZATION N/A 04/07/2015   Procedure: Right/Left Heart Cath and Coronary/Graft Angiography;  Surgeon: Sherren Mocha, MD;  Location: [place] [facility];  Service: Cardiovascular;  Laterality: N/A;  . CARDIAC CATHETERIZATION N/A 04/07/2015   Procedure: Coronary Stent Intervention;  Surgeon: Sherren Mocha, MD;  Location: [place] CV [facility];  Service: Cardiovascular;  Laterality: N/A;  . COLONOSCOPY N/A 06/29/2013   Procedure: COLONOSCOPY;  Surgeon: Danie Binder, MD;  [facility];  Service: Endoscopy;  Laterality: N/A;  10:45 AM  . CORONARY ANGIOPLASTY    . CORONARY ARTERY BYPASS GRAFT N/A 12/31/2012   Procedure: CORONARY ARTERY BYPASS GRAFTING (CABG);  Surgeon: Melrose Nakayama, MD;  Location: [place];  Service: Open Heart Surgery;  Laterality: N/A;  times four using left internal mammary artery, left  radial artery, endoscopically harvested right saphenous vein  . CORONARY STENT PLACEMENT  2005   LAD50/70, CFX OK, PL1 90, PL2 40, RCA 95->0 with 2.5 x 60 mm Taxus stent, EF 60  . LEFT HEART CATHETERIZATION WITH CORONARY ANGIOGRAM N/A 12/30/2012   Procedure: LEFT HEART CATHETERIZATION WITH CORONARY ANGIOGRAM;  Surgeon: Josue Hector, MD;  [facility]  CATH LAB;  Service: Cardiovascular;  Laterality: N/A;  . LUMBAR SPINE SURGERY  2005  . PERCUTANEOUS CORONARY STENT INTERVENTION (PCI-S)  04/07/2015   native left circumfles with DES  . RADIAL ARTERY HARVEST Left 12/31/2012   Procedure: RADIAL ARTERY HARVEST;  Surgeon: Melrose Nakayama, MD;  Location: [place];  Service: Open Heart Surgery;  Laterality: Left;     Current Outpatient Prescriptions  Medication Sig Dispense Refill  . aspirin EC 81 MG tablet Take 1 tablet (81 mg total) by mouth daily.    [name] EFFIENT 10 MG TABS tablet TAKE 1 TABLET BY MOUTH DAILY 30 tablet 3  . lisinopril (PRINIVIL,ZESTRIL) 20 MG tablet Take 1 tablet (20 mg total) by mouth 2 (two) times daily. 60 tablet 6  . metFORMIN (GLUCOPHAGE) 1000 MG tablet Take 1 tablet (1,000 mg total) by mouth 2 (two) times daily with a meal.    . metoprolol tartrate (LOPRESSOR) 25 MG tablet Take 0.5 tablets (12.5 mg total) by mouth 2 (two) times daily. 30 tablet 6  . nitroGLYCERIN (NITROSTAT) 0.4 MG SL tablet Place 1 tablet (0.4 mg total) under the tongue every 5 (five) minutes as needed for chest pain. 25 tablet 3  . simvastatin (ZOCOR) 40 MG tablet Take 1 tablet (40 mg total) by mouth at bedtime. 30 tablet 6  . TRADJENTA 5 MG TABS tablet Take 5 mg by mouth daily.   5   No current facility-administered medications for this visit.     Allergies:   Review of patient's allergies indicates no known allergies.    Social History:  The patient  reports that he quit smoking about 12 years ago. His smoking use included Cigars. He started smoking about 22 years ago. He has a 20.00 pack-year smoking history. He has never used smokeless tobacco. He reports that he uses drugs, including Marijuana. He reports that he does not drink alcohol.   Family History:  The patient's family history is not on file.    ROS: All other systems are reviewed and negative. Unless otherwise mentioned in H&P    PHYSICAL EXAM: VS:  BP (!) 142/88   Pulse 88   Ht 5'  10" (1.778 m)   Wt 225 lb (102.1 kg)   SpO2 95%   BMI 32.28 kg/m  , BMI Body mass index is 32.28 kg/m. GEN: Well nourished, well developed, in no acute distress  HEENT: normal  Neck: no JVD, carotid bruits, or masses Cardiac: RRR; tachycardic, 1/6 systolic murmur, rubs, or gallops,no edema  Respiratory:  clear to auscultation bilaterally, normal work of breathing GI: soft, nontender, nondistended, + BS MS: no deformity or atrophy  Skin: warm and dry, no rash Neuro:  Strength and sensation are intact Psych: euthymic mood, full affect   Lipid Panel    Component Value Date/Time   CHOL 140 06/17/2014 1016   TRIG 254 (H) 06/17/2014 1016   HDL 32 (L) 06/17/2014 1016   CHOLHDL 4.4 06/17/2014 1016   VLDL 51 (H) 06/17/2014 1016   LDLCALC 57 06/17/2014 1016      Wt Readings from Last 3 Encounters:  07/30/16 225 lb (102.1  kg)  01/30/16 231 lb (104.8 kg)  07/29/15 229 lb 3.2 oz (104 kg)     ASSESSMENT AND PLAN:  1.  CAD: He is overall doing well. He denies any symptoms. He is no longer on Effient. He has not been on metoprolol for unknown reason for several months. I will restart this at 12.5 mg twice a day. He has a high resting heart rate. With known coronary artery disease disease I have explained the importance of taking this medicine as directed.  2. Hypertension: Blood pressure slightly elevated. May be related to lack of beta blocker. He is taking lisinopril as directed. Labs will be completed to include see med, CBC, and fasting lipids and LFTs.  3. Hypercholesterolemia: Patient continues to use statin therapy. Labs are ordered.  4. Diabetes: Hemoglobin A1c is ordered. He does not follow a primary care physician. He is encouraged to find one. He will continue metformin as directed.   Current medicines are reviewed at length with the patient today.    Labs/ tests ordered today include: CBC, hemoglobin A1c, fasting lipids and LFTs, and BMET  No orders of the defined types  were placed in this encounter.    Disposition:   FU with 6 months   Signed, Jory Sims, NP  07/30/2016 1:12 PM    [facility] [address], [place], [place] [address] Phone: [phone]; Fax: [phone]

## 2016-07-31 ENCOUNTER — Telehealth: Payer: Self-pay | Admitting: *Deleted

## 2016-07-31 ENCOUNTER — Other Ambulatory Visit (HOSPITAL_COMMUNITY)
Admission: RE | Admit: 2016-07-31 | Discharge: 2016-07-31 | Disposition: A | Discharge status: Home / Self Care | Payer: Medicaid Other | Source: Ambulatory Visit | Attending: Adult Health | Admitting: Adult Health

## 2016-07-31 DIAGNOSIS — I10 Essential (primary) hypertension: Secondary | ICD-10-CM | POA: Insufficient documentation

## 2016-07-31 DIAGNOSIS — I25812 Atherosclerosis of bypass graft of coronary artery of transplanted heart without angina pectoris: Secondary | ICD-10-CM | POA: Insufficient documentation

## 2016-07-31 DIAGNOSIS — I480 Paroxysmal atrial fibrillation: Secondary | ICD-10-CM | POA: Diagnosis present

## 2016-07-31 DIAGNOSIS — R0600 Dyspnea, unspecified: Secondary | ICD-10-CM | POA: Diagnosis not present

## 2016-07-31 DIAGNOSIS — E119 Type 2 diabetes mellitus without complications: Secondary | ICD-10-CM | POA: Insufficient documentation

## 2016-07-31 LAB — CBC WITH DIFFERENTIAL/PLATELET
BASOS ABS: 0.1 10*3/uL (ref 0.0–0.1)
BASOS PCT: 1 %
EOS ABS: 0.6 10*3/uL (ref 0.0–0.7)
Eosinophils Relative: 6 %
HEMATOCRIT: 47.4 % (ref 39.0–52.0)
Hemoglobin: 16.6 g/dL (ref 13.0–17.0)
Lymphocytes Relative: 38 %
Lymphs Abs: 3.8 10*3/uL (ref 0.7–4.0)
MCH: 32.5 pg (ref 26.0–34.0)
MCHC: 35 g/dL (ref 30.0–36.0)
MCV: 92.9 fL (ref 78.0–100.0)
MONOS PCT: 7 %
Monocytes Absolute: 0.7 10*3/uL (ref 0.1–1.0)
NEUTROS ABS: 4.8 10*3/uL (ref 1.7–7.7)
NEUTROS PCT: 48 %
Platelets: 202 10*3/uL (ref 150–400)
RBC: 5.1 MIL/uL (ref 4.22–5.81)
RDW: 12.5 % (ref 11.5–15.5)
WBC: 9.9 10*3/uL (ref 4.0–10.5)

## 2016-07-31 LAB — BASIC METABOLIC PANEL
Anion gap: 8 (ref 5–15)
BUN: 22 mg/dL — ABNORMAL HIGH (ref 6–20)
CALCIUM: 9.6 mg/dL (ref 8.9–10.3)
CHLORIDE: 101 mmol/L (ref 101–111)
CO2: 27 mmol/L (ref 22–32)
CREATININE: 1.3 mg/dL — AB (ref 0.61–1.24)
GFR calc Af Amer: >60 mL/min (ref >60)
Glucose, Bld: 183 mg/dL — ABNORMAL HIGH (ref 65–99)
Potassium: 4.7 mmol/L (ref 3.5–5.1)
Sodium: 136 mmol/L (ref 135–145)

## 2016-07-31 LAB — LIPID PANEL
CHOLESTEROL: 240 mg/dL — AB (ref 0–200)
HDL: 32 mg/dL — AB (ref >40)
LDL CALC: 138 mg/dL — AB (ref 0–99)
TRIGLYCERIDES: 348 mg/dL — AB (ref <150)
Total CHOL/HDL Ratio: 7.5 RATIO
VLDL: 70 mg/dL — AB (ref 0–40)

## 2016-07-31 MED ORDER — ATORVASTATIN CALCIUM 80 MG PO TABS: 80.0000 mg | ORAL_TABLET | Freq: Every day | ORAL | 3 refills | Status: DC

## 2016-07-31 NOTE — Telephone Encounter (Signed)
Called patient with test results. No answer. Left message to call back.  

## 2016-07-31 NOTE — Telephone Encounter (Signed)
-----   Message from Lendon Colonel, NP sent at 07/31/2016 11:29 AM EDT ----- Reviewed lipid profile, cholesterol and triglycerides are not well controlled.  Change simvastatin to atorvastatin 80 mg daily. Repeat fasting lipids and LFT's in 3 months. Send him low cholesterol diet. Awaiting Hgb A1C.

## 2016-08-01 ENCOUNTER — Telehealth: Payer: Self-pay | Admitting: *Deleted

## 2016-08-01 LAB — HEMOGLOBIN A1C
Hgb A1c MFr Bld: 7.4 % — ABNORMAL HIGH (ref 4.8–5.6)
Mean Plasma Glucose: 166 mg/dL

## 2016-08-01 NOTE — Telephone Encounter (Signed)
Called patient with test results. No answer. Left message to call back.  

## 2016-08-01 NOTE — Telephone Encounter (Signed)
-----   Message from Lendon Colonel, NP sent at 08/01/2016 10:21 AM EDT ----- Poor diabetic control. Needs to have PCP to discuss medication adjustment.

## 2017-06-04 ENCOUNTER — Other Ambulatory Visit (HOSPITAL_COMMUNITY): Payer: Self-pay | Admitting: Internal Medicine

## 2017-06-04 DIAGNOSIS — R945 Abnormal results of liver function studies: Secondary | ICD-10-CM

## 2017-06-04 DIAGNOSIS — R7989 Other specified abnormal findings of blood chemistry: Secondary | ICD-10-CM

## 2017-06-12 ENCOUNTER — Ambulatory Visit (HOSPITAL_COMMUNITY)
Admission: RE | Admit: 2017-06-12 | Discharge: 2017-06-12 | Disposition: A | Discharge status: Home / Self Care | Payer: Medicaid Other | Source: Ambulatory Visit | Attending: Internal Medicine | Admitting: Internal Medicine

## 2017-06-12 DIAGNOSIS — R160 Hepatomegaly, not elsewhere classified: Secondary | ICD-10-CM | POA: Insufficient documentation

## 2017-06-12 DIAGNOSIS — R945 Abnormal results of liver function studies: Secondary | ICD-10-CM | POA: Diagnosis present

## 2017-06-12 DIAGNOSIS — R7989 Other specified abnormal findings of blood chemistry: Secondary | ICD-10-CM

## 2017-06-18 ENCOUNTER — Other Ambulatory Visit (HOSPITAL_COMMUNITY): Payer: Self-pay | Admitting: Internal Medicine

## 2017-06-18 DIAGNOSIS — K7689 Other specified diseases of liver: Secondary | ICD-10-CM

## 2017-07-09 ENCOUNTER — Ambulatory Visit (HOSPITAL_COMMUNITY)
Admission: RE | Admit: 2017-07-09 | Discharge: 2017-07-09 | Disposition: A | Discharge status: Home / Self Care | Payer: Medicaid Other | Source: Ambulatory Visit | Attending: Internal Medicine | Admitting: Internal Medicine

## 2017-07-09 DIAGNOSIS — R932 Abnormal findings on diagnostic imaging of liver and biliary tract: Secondary | ICD-10-CM | POA: Insufficient documentation

## 2017-07-09 DIAGNOSIS — R7989 Other specified abnormal findings of blood chemistry: Secondary | ICD-10-CM | POA: Insufficient documentation

## 2017-07-09 DIAGNOSIS — I700 Atherosclerosis of aorta: Secondary | ICD-10-CM | POA: Diagnosis not present

## 2017-07-09 DIAGNOSIS — K7689 Other specified diseases of liver: Secondary | ICD-10-CM

## 2017-07-09 LAB — POCT I-STAT CREATININE: CREATININE: 1.1 mg/dL (ref 0.61–1.24)

## 2017-07-09 MED ORDER — GADOBENATE DIMEGLUMINE 529 MG/ML IV SOLN
20.0000 mL | Freq: Once | INTRAVENOUS | Status: AC | PRN
  Administered 2017-07-09: 20 mL via INTRAVENOUS

## 2021-09-13 NOTE — Progress Notes (Addendum)
Cardiology Office Note:    Date:  09/14/2021   ID:  George, [name] Jan 15, 1961, MRN [id number]  PCP:  Alanson Puls, [facility] Providers Cardiologist:  Werner Lean, MD     Referring MD: Alanson Puls, [name]   CC: angina Consulted for the evaluation of establish care CAD at the behest of George [name]   History of Present Illness:    George [name] is a 60 y.o. male with a hx of CAD w/ prior CABG (2005 LIMA to LAD, L rad to RPDA, SVG OM1, SVG OM2, a dns /p Lcx PCI in 2016), HTN with DM, HLD with DM, Former Tobacco (stopped 05) and Marijuana Use who presents to re-establish care.  Last seen in 2017.   Patient notes that he is doing worse, again.  He had CP in august and started on August and started Imdur 10 mg which has improved the pain.    Has chest pressure that radiates upwards.  Also feels a burning.  This is his angina from 2014 and 2016.  His chest pain improves from an 8 to a 2 with nitro.  Uses about 2 tablets a day. No SOB/DOE and no PND/Orthopnea.  No weight gain or leg swelling.  No palpitations or syncope.  No upcoming surgeries or bleeding issues.  Had Imdur started and his metoprolol increased.  Ambulatory blood pressure 130/70.   Past Medical History:  Diagnosis Date   Coronary artery disease    a. MI 2005 s/p prior RCA stenting;  b. s/p CABG x 4 (LIMA->LAD, Radial->RPDA, VG->OM1->OM2);  c. 03/2015 Cath/PCI: LAD 50p/m, LCX 99p (3.0x20 Promus Premier DES), OM2 100, RCA 13m, Radial->RPDA nl, LIMA->LAD nl, VG->OM1->OM2 100, EF 55-65%.   Dyslipidemia    History of kidney stones    Hyperlipidemia    Hypertension    Hypertriglyceridemia    Marijuana abuse    Tobacco abuse    Type II diabetes mellitus ([place])     Past Surgical History:  Procedure Laterality Date   ANGIOPLASTY     CARDIAC CATHETERIZATION N/A 04/07/2015   Procedure: Right/Left Heart Cath and Coronary/Graft Angiography;  Surgeon: Sherren Mocha, MD;  Location:  [place] [facility];  Service: Cardiovascular;  Laterality: N/A;   CARDIAC CATHETERIZATION N/A 04/07/2015   Procedure: Coronary Stent Intervention;  Surgeon: Sherren Mocha, MD;  Location: [place] [facility];  Service: Cardiovascular;  Laterality: N/A;   COLONOSCOPY N/A 06/29/2013   Procedure: COLONOSCOPY;  Surgeon: Danie Binder, MD;  [facility];  Service: Endoscopy;  Laterality: N/A;  10:45 AM   CORONARY ANGIOPLASTY     CORONARY ARTERY BYPASS GRAFT N/A 12/31/2012   Procedure: CORONARY ARTERY BYPASS GRAFTING (CABG);  Surgeon: Melrose Nakayama, MD;  Location: [place];  Service: Open Heart Surgery;  Laterality: N/A;  times four using left internal mammary artery, left radial artery, endoscopically harvested right saphenous vein   CORONARY STENT PLACEMENT  2005   LAD50/70, CFX OK, PL1 90, PL2 40, RCA 95->0 with 2.5 x 60 mm Taxus stent, EF 60   LEFT HEART CATHETERIZATION WITH CORONARY ANGIOGRAM N/A 12/30/2012   Procedure: LEFT HEART CATHETERIZATION WITH CORONARY ANGIOGRAM;  Surgeon: Josue Hector, MD;  Location: [facility];  Service: Cardiovascular;  Laterality: N/A;   LUMBAR SPINE SURGERY  2005   PERCUTANEOUS CORONARY STENT INTERVENTION (PCI-S)  04/07/2015   native left circumfles with DES   RADIAL ARTERY HARVEST Left 12/31/2012   Procedure:  RADIAL ARTERY HARVEST;  Surgeon: Melrose Nakayama, MD;  Location: [place];  Service: Open Heart Surgery;  Laterality: Left;    Current Medications: Current Meds  Medication Sig   aspirin EC 81 MG tablet Take 1 tablet (81 mg total) by mouth daily.   atorvastatin (LIPITOR) 80 MG tablet Take 1 tablet (80 mg total) by mouth daily.   fenofibrate (TRICOR) 48 MG tablet Take 48 mg by mouth daily.   glimepiride (AMARYL) 2 MG tablet Take 2 mg by mouth daily.   isosorbide mononitrate (IMDUR) 30 MG 24 hr tablet Take 1 tablet (30 mg total) by mouth daily.   lisinopril (PRINIVIL,ZESTRIL) 20 MG tablet Take 1 tablet (20 mg total) by mouth 2 (two) times  daily.   metFORMIN (GLUCOPHAGE) 1000 MG tablet Take 1 tablet (1,000 mg total) by mouth 2 (two) times daily with a meal.   metoprolol tartrate (LOPRESSOR) 25 MG tablet Take 0.5 tablets (12.5 mg total) by mouth 2 (two) times daily.   nitroGLYCERIN (NITROSTAT) 0.4 MG SL tablet Place 1 tablet (0.4 mg total) under the tongue every 5 (five) minutes as needed for chest pain.   TRADJENTA 5 MG TABS tablet Take 5 mg by mouth daily.   TRULICITY 1.5 KD/9.8PJ SOPN Inject 1.5 mg into the skin once a week.   [DISCONTINUED] isosorbide dinitrate (ISORDIL) 10 MG tablet Take 10 mg by mouth 2 (two) times daily.     Allergies:   Patient has no known allergies.   Social History   Socioeconomic History   Marital status: Married    Spouse name: Not on file   Number of children: Not on file   Years of education: Not on file   Highest education level: Not on file  Occupational History   Occupation: Disabled  Tobacco Use   Smoking status: Former    Packs/day: 2.00    Years: 10.00    Pack years: 20.00    Types: Cigars, Cigarettes    Start date: 06/16/1994    Quit date: 06/12/2004    Years since quitting: 17.2   Smokeless tobacco: Never  Vaping Use   Vaping Use: Never used  Substance and Sexual Activity   Alcohol use: No    Alcohol/week: 0.0 standard drinks    Comment: Occasional   Drug use: Yes    Types: Marijuana    Comment: Last time-02/2013   Sexual activity: Yes    Partners: Female, Male  Other Topics Concern   Not on file  Social History Narrative   Married   Sedentary   Social Determinants of Health   Financial Resource Strain: Not on file  Food Insecurity: Not on file  Transportation Needs: Not on file  Physical Activity: Not on file  Stress: Not on file  Social Connections: Not on file     Family History: The patient's family history includes Cancer in his father; Diabetes in his mother; Heart disease in his mother. There is no history of Colon cancer.  ROS:   Please see the  history of present illness.     All other systems reviewed and are negative.  EKGs/Labs/Other Studies Reviewed:    The following studies were reviewed today:  EKG:  EKG is  ordered today.  The ekg ordered today demonstrates  09/14/21: SR with sinus arrhythmia inferior q waves  Transthoracic Echocardiogram: Date: 08/03/2015 Results: Study Conclusions   - Left ventricle: The cavity size was normal. Wall thickness was    increased in a pattern of mild  LVH. Systolic function was normal.    The estimated ejection fraction was in the range of 60% to 65%.    Wall motion was normal; there were no regional wall motion    abnormalities. Left ventricular diastolic function parameters    were normal.  - Aortic valve: Mildly calcified annulus. Trileaflet; mildly    thickened leaflets. Valve area (VTI): 2.58 cm^2. Valve area    (Vmax): 2.83 cm^2.  - Mitral valve: Mildly calcified annulus. Normal thickness leaflets    .  - Right ventricle: The cavity size was mildly dilated.  - Atrial septum: No defect or patent foramen ovale was identified.  - Technically adequate study.   Left/Right Heart Catheterizations: Date: 09/14/21 Results:  CONCLUSIONS: Severe three-vessel coronary artery disease with total occlusion of the RCA, subtotal occlusion of the proximal left circumflex with 99% stenosis, and moderate diffuse 50-60% stenosis of the LAD Normal LV systolic function with estimated LVEF 55-65% Status post aortocoronary bypass surgery with continued patency of the LIMA to LAD and free radial graft to PDA and total occlusion of the sequential saphenous graft to OM1 and Successful PCI of the native left circumflex with a single drug-eluting stent(Promus premier 3.0 x 20 mm DES) Normal right heart hemodynamics   RECOMMENDATIONS: Dual antiplatelets therapy with aspirin and Effient for at least 12 months As long as no complications, anticipate discharge home tomorrow  Recent Labs: No results found  for requested labs within last 8760 hours.  Recent Lipid Panel    Component Value Date/Time   CHOL 240 (H) 07/31/2016 0824   TRIG 348 (H) 07/31/2016 0824   HDL 32 (L) 07/31/2016 0824   CHOLHDL 7.5 07/31/2016 0824   VLDL 70 (H) 07/31/2016 0824   LDLCALC 138 (H) 07/31/2016 [facility]  or Outside [name] Studies (OSH):  Date: 06/14/21 Cholesterol 131 HDL 26 LDL 55 Tgs 324 Creatinine 1.13      Physical Exam:    VS:  BP 136/74   Pulse 72   Ht 5\' 10"  (1.778 m)   Wt 230 lb 9.6 oz (104.6 kg)   SpO2 98%   BMI 33.09 kg/m     Wt Readings from Last 3 Encounters:  09/14/21 230 lb 9.6 oz (104.6 kg)  07/30/16 225 lb (102.1 kg)  01/30/16 231 lb (104.8 kg)     GEN:  Well nourished, well developed in no acute distress HEENT: Normal NECK: No JVD LYMPHATICS: No lymphadenopathy CARDIAC: RRR, no rubs, gallops; soft systolic murmur RESPIRATORY:  Clear to auscultation without rales, wheezing or rhonchi  ABDOMEN: Soft, non-tender, non-distended MUSCULOSKELETAL:  No edema; No deformity  SKIN: Warm and dry NEUROLOGIC:  Alert and oriented x 3 PSYCHIATRIC:  Normal affect   ASSESSMENT:    1. Essential hypertension, benign   2. S/P CABG (coronary artery bypass graft)   3. PAF (paroxysmal atrial fibrillation) ([place])   4. HYPERTRIGLYCERIDEMIA   5. Marijuana use    PLAN:    CAD w/ prior CABG (2014 LIMA to LAD, L rad to RPDA, SVG OM1, SVG OM2, as/p  Lcx PCI in 2016) HTN with DM HLD with DM Tobacco and Marijuana Use Hypertryglyceridemia - he notes that he significant PTSD about his prior cath experience - continue ASA 81, atorvastation 80, lisinopril 20, and metoprolol 12.5 mg PO BID - adding imdur 30 mg PO daily - offered LHC-G and patient declined.  If his Imdur does not significantly improve his symptoms, he will call in and we will schedule LHC  ADDENDUM:  Working to get him on Tgs specific therapy with our Pharm D team.  Discussed Marijuana Cessation and that CYP  interactions can be associated with inappropriate metabolism of  - CCB - Long acting nitrates - Atorvastatin - BB - discussed 3.3 X higher risk of stroke/vascular disease with marijuana use ([facility] 04/19/21)  Will see back in February  Medication Adjustments/Labs and Tests Ordered: Current medicines are reviewed at length with the patient today.  Concerns regarding medicines are outlined above.  Orders Placed This Encounter  Procedures   EKG 12-Lead   Meds ordered this encounter  Medications   isosorbide mononitrate (IMDUR) 30 MG 24 hr tablet    Sig: Take 1 tablet (30 mg total) by mouth daily.    Dispense:  90 tablet    Refill:  3     Patient Instructions  Medication Instructions:  Your physician has recommended you make the following change in your medication:   START Imdur 30 mg taking 1 daily    *If you need a refill on your cardiac medications before your next appointment, please call your pharmacy*   Lab Work: None ordered  If you have labs (blood work) drawn today and your tests are completely normal, you will receive your results only by: [place] (if you have MyChart) OR A paper copy in the mail If you have any lab test that is abnormal or we need to change your treatment, we will call you to review the results.   Testing/Procedures: None ordered   Follow-Up: At [facility], you and your health needs are our priority.  As part of our continuing mission to provide you with exceptional heart care, we have created designated Provider Care Teams.  These Care Teams include your primary Cardiologist (physician) and Advanced Practice Providers (APPs -  Physician Assistants and Nurse Practitioners) who all work together to provide you with the care you need, when you need it.  We recommend signing up for the patient portal called "MyChart".  Sign up information is provided on this After Visit Summary.  MyChart is used to connect with patients for Virtual Visits  (Telemedicine).  Patients are able to view lab/test results, encounter notes, upcoming appointments, etc.  Non-urgent messages can be sent to your provider as well.   To learn more about what you can do with MyChart, go to [email or web address].    Your next appointment:   3 month(s)  12/19/2021 ARRIVE AT 12:45   The format for your next appointment:   In Person  Provider:   You may see Dr. Gasper Sells or one of the following Advanced Practice Providers on your designated Care Team:   Bernerd Pho, PA-C  Ermalinda Barrios, PA-C    Other Instructions    Signed, Werner Lean, MD  09/14/2021 9:16 AM    [place]

## 2021-09-14 ENCOUNTER — Encounter: Payer: Self-pay | Admitting: Internal Medicine

## 2021-09-14 ENCOUNTER — Other Ambulatory Visit: Payer: Self-pay

## 2021-09-14 ENCOUNTER — Ambulatory Visit (INDEPENDENT_AMBULATORY_CARE_PROVIDER_SITE_OTHER): Payer: Medicaid Other | Admitting: Internal Medicine

## 2021-09-14 VITALS — BP 136/74 | HR 72 | Ht 70.0 in | Wt 230.6 lb

## 2021-09-14 DIAGNOSIS — Z951 Presence of aortocoronary bypass graft: Secondary | ICD-10-CM | POA: Diagnosis not present

## 2021-09-14 DIAGNOSIS — I10 Essential (primary) hypertension: Secondary | ICD-10-CM | POA: Diagnosis not present

## 2021-09-14 DIAGNOSIS — E781 Pure hyperglyceridemia: Secondary | ICD-10-CM

## 2021-09-14 DIAGNOSIS — F1290 Cannabis use, unspecified, uncomplicated: Secondary | ICD-10-CM | POA: Insufficient documentation

## 2021-09-14 DIAGNOSIS — I480 Paroxysmal atrial fibrillation: Secondary | ICD-10-CM

## 2021-09-14 MED ORDER — ISOSORBIDE MONONITRATE ER 30 MG PO TB24: 30.0000 mg | ORAL_TABLET | Freq: Every day | ORAL | 3 refills | Status: DC

## 2021-09-14 NOTE — Patient Instructions (Addendum)
Medication Instructions:  Your physician has recommended you make the following change in your medication:   START Imdur 30 mg taking 1 daily    *If you need a refill on your cardiac medications before your next appointment, please call your pharmacy*   Lab Work: None ordered  If you have labs (blood work) drawn today and your tests are completely normal, you will receive your results only by: [place] (if you have MyChart) OR A paper copy in the mail If you have any lab test that is abnormal or we need to change your treatment, we will call you to review the results.   Testing/Procedures: None ordered   Follow-Up: At [facility], you and your health needs are our priority.  As part of our continuing mission to provide you with exceptional heart care, we have created designated Provider Care Teams.  These Care Teams include your primary Cardiologist (physician) and Advanced Practice Providers (APPs -  Physician Assistants and Nurse Practitioners) who all work together to provide you with the care you need, when you need it.  We recommend signing up for the patient portal called "MyChart".  Sign up information is provided on this After Visit Summary.  MyChart is used to connect with patients for Virtual Visits (Telemedicine).  Patients are able to view lab/test results, encounter notes, upcoming appointments, etc.  Non-urgent messages can be sent to your provider as well.   To learn more about what you can do with MyChart, go to [email or web address].    Your next appointment:   3 month(s)  12/19/2021 ARRIVE AT 12:45   The format for your next appointment:   In Person  Provider:   You may see Dr. Gasper Sells or one of the following Advanced Practice Providers on your designated Care Team:   Bernerd Pho, PA-C  Ermalinda Barrios, [place]    Other Instructions

## 2021-09-18 ENCOUNTER — Telehealth: Payer: Self-pay | Admitting: Pharmacist

## 2021-09-18 ENCOUNTER — Encounter: Payer: Self-pay | Admitting: Pharmacist

## 2021-09-18 MED ORDER — ICOSAPENT ETHYL 1 G PO CAPS: 2.0000 g | ORAL_CAPSULE | Freq: Two times a day (BID) | ORAL | 11 refills | Status: DC

## 2021-09-18 MED ORDER — OMEGA-3-ACID ETHYL ESTERS 1 G PO CAPS: 2.0000 g | ORAL_CAPSULE | Freq: Two times a day (BID) | ORAL | 3 refills | Status: DC

## 2021-09-18 NOTE — Telephone Encounter (Signed)
This encounter was created in error - please disregard.

## 2021-09-18 NOTE — Telephone Encounter (Signed)
PA for lovaza approved. Rx sent to pharmacy. Called and LVM for pt to call back to review.

## 2021-09-29 NOTE — Telephone Encounter (Signed)
No answer- left message that medication was sent to pharmacy for pick up

## 2021-12-18 NOTE — Progress Notes (Signed)
Cardiology Office Note:    Date:  12/19/2021   ID:  George [name], George [name], George [name], George [name]  PCP:  Alanson Puls, [facility] Cardiologist:  Werner Lean, MD     Referring MD: Alanson Puls, [facility]   CC: Angina  History of Present Illness:    George [name] is a 61 y.o. male with a hx of CAD w/ prior CABG (2014 LIMA to LAD, L rad to RPDA, SVG OM1, SVG OM2, a dns /p Lcx PCI in 2016), HTN with DM, HLD with DM, Former Tobacco (stopped 05) and Marijuana Use who presents to re-establish care.  At index visit with me had persistent CP but deferred a repeat LHC.  Started Imdur and Lovaza. Seen 12/18/21.  Patient notes that he is doing a little better.   Took a week for the Imdur to get to feeling.   Still having chest pain with exertion, and now with sexual activity.  No SOB/DOE and no PND/Orthopnea.  No weight gain or leg swelling.  No palpitations or syncope. No headache with Imdur (HA have improved).  Notes that his chest pain have improved off of Trulicity.  Notes that he has cut back on his smoking as he is taking special gummies.  Past Medical History:  Diagnosis Date   Coronary artery disease    a. MI 2005 s/p prior RCA stenting;  b. s/p CABG x 4 (LIMA->LAD, Radial->RPDA, VG->OM1->OM2);  c. 03/2015 Cath/PCI: LAD 50p/m, LCX 99p (3.0x20 Promus Premier DES), OM2 100, RCA 140m, Radial->RPDA nl, LIMA->LAD nl, VG->OM1->OM2 100, EF 55-65%.   Dyslipidemia    History of kidney stones    Hyperlipidemia    Hypertension    Hypertriglyceridemia    Marijuana abuse    Tobacco abuse    Type II diabetes mellitus ([place])     Past Surgical History:  Procedure Laterality Date   ANGIOPLASTY     CARDIAC CATHETERIZATION N/A 04/07/2015   Procedure: Right/Left Heart Cath and Coronary/Graft Angiography;  Surgeon: Sherren Mocha, MD;  Location: [place] [facility];  Service: Cardiovascular;  Laterality: N/A;   CARDIAC CATHETERIZATION N/A 04/07/2015   Procedure:  Coronary Stent Intervention;  Surgeon: Sherren Mocha, MD;  Location: [place] [facility];  Service: Cardiovascular;  Laterality: N/A;   COLONOSCOPY N/A 06/29/2013   Procedure: COLONOSCOPY;  Surgeon: Danie Binder, MD;  [facility];  Service: Endoscopy;  Laterality: N/A;  10:45 AM   CORONARY ANGIOPLASTY     CORONARY ARTERY BYPASS GRAFT N/A 12/31/2012   Procedure: CORONARY ARTERY BYPASS GRAFTING (CABG);  Surgeon: Melrose Nakayama, MD;  Location: [place];  Service: Open Heart Surgery;  Laterality: N/A;  times four using left internal mammary artery, left radial artery, endoscopically harvested right saphenous vein   CORONARY STENT PLACEMENT  2005   LAD50/70, CFX OK, PL1 90, PL2 40, RCA 95->0 with 2.5 x 60 mm Taxus stent, EF 60   LEFT HEART CATHETERIZATION WITH CORONARY ANGIOGRAM N/A 12/30/2012   Procedure: LEFT HEART CATHETERIZATION WITH CORONARY ANGIOGRAM;  Surgeon: Josue Hector, MD;  [facility];  Service: Cardiovascular;  Laterality: N/A;   LUMBAR SPINE SURGERY  2005   PERCUTANEOUS CORONARY STENT INTERVENTION (PCI-S)  04/07/2015   native left circumfles with DES   RADIAL ARTERY HARVEST Left 12/31/2012   Procedure: RADIAL ARTERY HARVEST;  Surgeon: Melrose Nakayama, MD;  Location: [place];  Service: Open Heart Surgery;  Laterality: Left;    Current  Medications: Current Meds  Medication Sig   amLODipine (NORVASC) 10 MG tablet Take 10 mg by mouth daily.   aspirin EC 81 MG tablet Take 1 tablet (81 mg total) by mouth daily.   atorvastatin (LIPITOR) 80 MG tablet Take 40 mg by mouth daily.   fenofibrate (TRICOR) 48 MG tablet Take 48 mg by mouth daily.   glimepiride (AMARYL) 2 MG tablet Take 2 mg by mouth daily.   isosorbide mononitrate (IMDUR) 60 MG 24 hr tablet Take 1 tablet (60 mg total) by mouth daily.   lisinopril (ZESTRIL) 40 MG tablet Take 40 mg by mouth daily.   metFORMIN (GLUCOPHAGE) 1000 MG tablet Take 1 tablet (1,000 mg total) by mouth 2 (two) times daily with a  meal.   metoprolol tartrate (LOPRESSOR) 100 MG tablet Take 100 mg by mouth 2 (two) times daily.   omega-3 acid ethyl esters (LOVAZA) 1 g capsule Take 2 capsules (2 g total) by mouth 2 (two) times daily.   TRADJENTA 5 MG TABS tablet Take 5 mg by mouth daily.   [DISCONTINUED] isosorbide mononitrate (IMDUR) 30 MG 24 hr tablet Take 1 tablet (30 mg total) by mouth daily.   [DISCONTINUED] nitroGLYCERIN (NITROSTAT) 0.4 MG SL tablet Place 1 tablet (0.4 mg total) under the tongue every 5 (five) minutes as needed for chest pain. (Patient taking differently: Place 0.4 mg under the tongue every 5 (five) minutes x 3 doses as needed for chest pain (if no relief after 2nd dose, proceed to ED or call 911).)   [DISCONTINUED] nitroGLYCERIN (NITROSTAT) 0.4 MG SL tablet Place 0.4 mg under the tongue every 5 (five) minutes x 3 doses as needed for chest pain (if no relief after 2nd dose, proceed to ED or call 911).     Allergies:   Patient has no known allergies.   Social History   Socioeconomic History   Marital status: Married    Spouse name: Not on file   Number of children: Not on file   Years of education: Not on file   Highest education level: Not on file  Occupational History   Occupation: Disabled  Tobacco Use   Smoking status: Former    Packs/day: 2.00    Years: 10.00    Pack years: 20.00    Types: Cigars, Cigarettes    Start date: 06/16/1994    Quit date: 06/12/2004    Years since quitting: [name].5   Smokeless tobacco: Never  Vaping Use   Vaping Use: Never used  Substance and Sexual Activity   Alcohol use: No    Alcohol/week: 0.0 standard drinks    Comment: Occasional   Drug use: Yes    Types: Marijuana    Comment: Last time-02/2013   Sexual activity: Yes    Partners: Female, Male  Other Topics Concern   Not on file  Social History Narrative   Married   Sedentary   Social Determinants of Health   Financial Resource Strain: Not on file  Food Insecurity: Not on file  Transportation  Needs: Not on file  Physical Activity: Not on file  Stress: Not on file  Social Connections: Not on file    Social: Married, Father died Christmas 2022  Family History: The patient's family history includes Cancer in his father; Diabetes in his mother; Heart disease in his mother. There is no history of Colon cancer.  ROS:   Please see the history of present illness.     All other systems reviewed and are negative.  EKGs/Labs/Other  Studies Reviewed:    The following studies were reviewed today:  EKG:   09/14/21: SR with sinus arrhythmia inferior q waves  Transthoracic Echocardiogram: Date: 08/03/2015 Results: Study Conclusions   - Left ventricle: The cavity size was normal. Wall thickness was    increased in a pattern of mild LVH. Systolic function was normal.    The estimated ejection fraction was in the range of 60% to 65%.    Wall motion was normal; there were no regional wall motion    abnormalities. Left ventricular diastolic function parameters    were normal.  - Aortic valve: Mildly calcified annulus. Trileaflet; mildly    thickened leaflets. Valve area (VTI): 2.58 cm^2. Valve area    (Vmax): 2.83 cm^2.  - Mitral valve: Mildly calcified annulus. Normal thickness leaflets    .  - Right ventricle: The cavity size was mildly dilated.  - Atrial septum: No defect or patent foramen ovale was identified.  - Technically adequate study.   Left/Right Heart Catheterizations: Date: 09/14/21 Results:  CONCLUSIONS: Severe three-vessel coronary artery disease with total occlusion of the RCA, subtotal occlusion of the proximal left circumflex with 99% stenosis, and moderate diffuse 50-60% stenosis of the LAD Normal LV systolic function with estimated LVEF 55-65% Status post aortocoronary bypass surgery with continued patency of the LIMA to LAD and free radial graft to PDA and total occlusion of the sequential saphenous graft to OM1 and Successful PCI of the native left circumflex  with a single drug-eluting stent(Promus premier 3.0 x 20 mm DES) Normal right heart hemodynamics   RECOMMENDATIONS: Dual antiplatelets therapy with aspirin and Effient for at least 12 months As long as no complications, anticipate discharge home tomorrow  Recent Labs: No results found for requested labs within last 8760 hours.  Recent Lipid Panel    Component Value Date/Time   CHOL 240 (H) 07/31/2016 0824   TRIG 348 (H) 07/31/2016 0824   HDL 32 (L) 07/31/2016 0824   CHOLHDL 7.5 07/31/2016 0824   VLDL 70 (H) 07/31/2016 0824   LDLCALC 138 (H) 07/31/2016 0824        Physical Exam:    VS:  BP (!) 144/88    Pulse 74    Ht 5\' 10"  (1.778 m)    Wt 103.4 kg    SpO2 98%    BMI 32.71 kg/m     Wt Readings from Last 3 Encounters:  12/19/21 103.4 kg  09/14/21 104.6 kg  09/18/[name] 102.1 kg     Gen: No distress   Neck: No JVD Ears: Right Frank Sign Cardiac: No Rubs or Gallops, Systolic crescendo murmur, regular rate, R +2 radial pulses Respiratory: Clear to auscultation bilaterally, normal effort, normal  respiratory rate GI: Soft, nontender, non-distended  MS: No edema;  moves all extremities Integument: Skin feels warm Neuro:  At time of evaluation, alert and oriented to person/place/time/situation  Psych: Normal affect, patient feels OK   ASSESSMENT:    1. Heart murmur, systolic   2. S/P CABG (coronary artery bypass graft)   3. TOBACCO ABUSE   4. HYPERTRIGLYCERIDEMIA     PLAN:    CAD w/ prior CABG (2014 LIMA to LAD, L rad to RPDA, SVG OM1, SVG OM2, as/p  Lcx PCI in 2016) HTN with DM HLD with DM Tobacco and Marijuana Use Hypertryglyceridemia - he notes that he significant PTSD about his prior cath experience; we have reviewed this again:  He has declined LHC-G, we will use medical therapy for his evaluation and  if this fails he knows red flags for ED assessment.  Will call to let me know if he changes his mind - continue ASA 81, atorvastation 80, lisinopril 20, and  metoprolol 12.5 mg PO BID - increasing imdur 60 mg PO daily - started on lovaza, continue secondary prevention medications - will get echo for new murmur   Discussed Marijuana Cessation and that CYP interactions can be associated with inappropriate metabolism (even with gummies) of  - CCB - Long acting nitrates - Atorvastatin - BB - discussed 3.3 X higher risk of stroke/vascular disease with marijuana use ([facility] 04/19/21)  Six month follow up  Medication Adjustments/Labs and Tests Ordered: Current medicines are reviewed at length with the patient today.  Concerns regarding medicines are outlined above.  Orders Placed This Encounter  Procedures   ECHOCARDIOGRAM COMPLETE   Meds ordered this encounter  Medications   nitroGLYCERIN (NITROSTAT) 0.4 MG SL tablet    Sig: Place 1 tablet (0.4 mg total) under the tongue every 5 (five) minutes x 3 doses as needed for chest pain (if no relief after 2nd dose, proceed to ED or call 911).    Dispense:  25 tablet    Refill:  3   isosorbide mononitrate (IMDUR) 60 MG 24 hr tablet    Sig: Take 1 tablet (60 mg total) by mouth daily.    Dispense:  90 tablet    Refill:  1    12/19/2021 dose increase     Patient Instructions  Medication Instructions:  Your physician has recommended you make the following change in your medication:  Increase isosorbide mononitrate to 60 mg daily Continue other medications the same  Labwork: none  Testing/Procedures: Your physician has requested that you have an echocardiogram. Echocardiography is a painless test that uses sound waves to create images of your heart. It provides your doctor with information about the size and shape of your heart and how well your hearts chambers and valves are working. This procedure takes approximately one hour. There are no restrictions for this procedure.  Follow-Up: Your physician recommends that you schedule a follow-up appointment in: 6 months  Any Other Special  Instructions Will Be Listed Below (If Applicable).  If you need a refill on your cardiac medications before your next appointment, please call your pharmacy.   Signed, Werner Lean, MD  12/19/2021 1:22 PM    [facility]

## 2021-12-19 ENCOUNTER — Encounter: Payer: Self-pay | Admitting: Internal Medicine

## 2021-12-19 ENCOUNTER — Ambulatory Visit (INDEPENDENT_AMBULATORY_CARE_PROVIDER_SITE_OTHER): Payer: Medicaid Other | Admitting: Internal Medicine

## 2021-12-19 ENCOUNTER — Other Ambulatory Visit: Payer: Self-pay

## 2021-12-19 VITALS — BP 144/88 | HR 74 | Ht 70.0 in | Wt 228.0 lb

## 2021-12-19 DIAGNOSIS — Z951 Presence of aortocoronary bypass graft: Secondary | ICD-10-CM | POA: Diagnosis not present

## 2021-12-19 DIAGNOSIS — F17200 Nicotine dependence, unspecified, uncomplicated: Secondary | ICD-10-CM

## 2021-12-19 DIAGNOSIS — R011 Cardiac murmur, unspecified: Secondary | ICD-10-CM

## 2021-12-19 DIAGNOSIS — E781 Pure hyperglyceridemia: Secondary | ICD-10-CM

## 2021-12-19 MED ORDER — NITROGLYCERIN 0.4 MG SL SUBL: 0.4000 mg | SUBLINGUAL_TABLET | SUBLINGUAL | 3 refills | Status: DC | PRN

## 2021-12-19 MED ORDER — ISOSORBIDE MONONITRATE ER 60 MG PO TB24: 60.0000 mg | ORAL_TABLET | Freq: Every day | ORAL | 1 refills | Status: DC

## 2021-12-19 NOTE — Patient Instructions (Addendum)
Medication Instructions:  Your physician has recommended you make the following change in your medication:  Increase isosorbide mononitrate to 60 mg daily Continue other medications the same  Labwork: none  Testing/Procedures: Your physician has requested that you have an echocardiogram. Echocardiography is a painless test that uses sound waves to create images of your heart. It provides your doctor with information about the size and shape of your heart and how well your hearts chambers and valves are working. This procedure takes approximately one hour. There are no restrictions for this procedure.  Follow-Up: Your physician recommends that you schedule a follow-up appointment in: 6 months  Any Other Special Instructions Will Be Listed Below (If Applicable).  If you need a refill on your cardiac medications before your next appointment, please call your pharmacy.

## 2022-01-12 ENCOUNTER — Ambulatory Visit (HOSPITAL_COMMUNITY)
Admission: RE | Admit: 2022-01-12 | Discharge: 2022-01-12 | Disposition: A | Discharge status: Home / Self Care | Payer: Medicaid Other | Source: Ambulatory Visit | Attending: Internal Medicine | Admitting: Internal Medicine

## 2022-01-12 DIAGNOSIS — R011 Cardiac murmur, unspecified: Secondary | ICD-10-CM | POA: Insufficient documentation

## 2022-01-12 LAB — ECHOCARDIOGRAM COMPLETE
AR max vel: 2.25 cm2
AV Area VTI: 2.49 cm2
AV Area mean vel: 2.52 cm2
AV Mean grad: 9.4 mmHg
AV Peak grad: 18.4 mmHg
Ao pk vel: 2.14 m/s
Area-P 1/2: 3.63 cm2
Calc EF: 64.3 %
S' Lateral: 3.5 cm
Single Plane A2C EF: 62.3 %
Single Plane A4C EF: 65 %

## 2022-01-12 MED ORDER — PERFLUTREN LIPID MICROSPHERE
1.0000 mL | INTRAVENOUS | Status: AC | PRN
  Administered 2022-01-12: 2 mL via INTRAVENOUS
  Filled 2022-01-12: qty 10

## 2022-01-12 NOTE — Progress Notes (Signed)
?  Echocardiogram ?2D Echocardiogram has been performed. ? [name] ?01/12/2022, 2:43 PM ?

## 2022-01-18 ENCOUNTER — Emergency Department (HOSPITAL_COMMUNITY)
Admission: EM | Admit: 2022-01-18 | Discharge: 2022-01-18 | Disposition: A | Discharge status: Home / Self Care | Payer: Medicaid Other | Attending: Emergency Medicine | Admitting: Emergency Medicine

## 2022-01-18 ENCOUNTER — Encounter (HOSPITAL_COMMUNITY): Payer: Self-pay | Admitting: *Deleted

## 2022-01-18 DIAGNOSIS — I10 Essential (primary) hypertension: Secondary | ICD-10-CM | POA: Insufficient documentation

## 2022-01-18 DIAGNOSIS — I2510 Atherosclerotic heart disease of native coronary artery without angina pectoris: Secondary | ICD-10-CM | POA: Insufficient documentation

## 2022-01-18 DIAGNOSIS — Z79899 Other long term (current) drug therapy: Secondary | ICD-10-CM | POA: Insufficient documentation

## 2022-01-18 DIAGNOSIS — Z87891 Personal history of nicotine dependence: Secondary | ICD-10-CM | POA: Insufficient documentation

## 2022-01-18 DIAGNOSIS — Z7982 Long term (current) use of aspirin: Secondary | ICD-10-CM | POA: Insufficient documentation

## 2022-01-18 DIAGNOSIS — E119 Type 2 diabetes mellitus without complications: Secondary | ICD-10-CM | POA: Diagnosis not present

## 2022-01-18 DIAGNOSIS — L02214 Cutaneous abscess of groin: Secondary | ICD-10-CM | POA: Insufficient documentation

## 2022-01-18 DIAGNOSIS — Z7984 Long term (current) use of oral hypoglycemic drugs: Secondary | ICD-10-CM | POA: Insufficient documentation

## 2022-01-18 DIAGNOSIS — Z951 Presence of aortocoronary bypass graft: Secondary | ICD-10-CM | POA: Insufficient documentation

## 2022-01-18 MED ORDER — DOXYCYCLINE HYCLATE 100 MG PO CAPS: 100.0000 mg | ORAL_CAPSULE | Freq: Two times a day (BID) | ORAL | 0 refills | Status: DC

## 2022-01-18 NOTE — Discharge Instructions (Signed)
Take the doxycycline as directed.  Soak daily in warm water.  If abscesses come to a head follow-up for incision and drainage.  If you get any significant swelling of your scrotum you need to get seen immediately.  Also given information to the general surgery clinic locally.  Since you have had recurrence in this area.  They may be able to be helpful to you. ?

## 2022-01-18 NOTE — ED Triage Notes (Signed)
States he has an abscess in groin area for the past week, states he is usually able to pop the abscess ?

## 2022-01-18 NOTE — ED Provider Notes (Addendum)
?[facility] ?Provider Note ? ? ?CSN: [id number] ?Arrival date & time: 01/18/22  1324 ? ?  ? ?History ? ?Chief Complaint  ?Patient presents with  ? Abscess  ? ? ?George [name] is a 61 y.o. male. ? ?Patient with a history of recurrent groin abscesses.  Patient states that started a week ago.  He did get some drainage.  But sealed.  He has been doing sitz bath's which have been providing some relief.  Patient's past medical history significant for diabetes says his blood sugars are well controlled.  Patient denies any fevers.  Denies any scrotal swelling.  Denies any nausea or vomiting. ? ?Past medical history is significant for coronary artery disease with coronary bypass graft in 2014.  Hyperlipidemia tobacco abuse hypertension type 2 diabetes marijuana abuse history of kidney stones.  Patient did quit smoking in 2005. ? ? ?  ? ?Home Medications ?Prior to Admission medications   ?Medication Sig Start Date End Date Taking? Authorizing Provider  ?amLODipine (NORVASC) 10 MG tablet Take 10 mg by mouth daily. 10/18/21   [provider]  ?aspirin EC 81 MG tablet Take 1 tablet (81 mg total) by mouth daily. 05/26/14   Herminio Commons, MD  ?atorvastatin (LIPITOR) 80 MG tablet Take 40 mg by mouth daily.    [provider]  ?doxycycline (VIBRAMYCIN) 100 MG capsule Take 1 capsule (100 mg total) by mouth 2 (two) times daily. 01/18/22   Fredia Sorrow, MD  ?fenofibrate (TRICOR) 48 MG tablet Take 48 mg by mouth daily. 06/21/21   [provider]  ?glimepiride (AMARYL) 2 MG tablet Take 2 mg by mouth daily. 06/21/21   [provider]  ?isosorbide mononitrate (IMDUR) 60 MG 24 hr tablet Take 1 tablet (60 mg total) by mouth daily. 12/19/21   Rudean Haskell A, MD  ?lisinopril (ZESTRIL) 40 MG tablet Take 40 mg by mouth daily. 12/14/21   [provider]  ?metFORMIN (GLUCOPHAGE) 1000 MG tablet Take 1 tablet (1,000 mg total) by mouth 2 (two) times daily with a meal. 04/08/15    Theora Gianotti, NP  ?metoprolol tartrate (LOPRESSOR) 100 MG tablet Take 100 mg by mouth 2 (two) times daily. 11/21/21   [provider]  ?nitroGLYCERIN (NITROSTAT) 0.4 MG SL tablet Place 1 tablet (0.4 mg total) under the tongue every 5 (five) minutes x 3 doses as needed for chest pain (if no relief after 2nd dose, proceed to ED or call 911). 12/19/21   Werner Lean, MD  ?omega-3 acid ethyl esters (LOVAZA) 1 g capsule Take 2 capsules (2 g total) by mouth 2 (two) times daily. 09/18/21   Chandrasekhar, Terisa Starr, MD  ?TRADJENTA 5 MG TABS tablet Take 5 mg by mouth daily. 06/19/21   [provider]  ?   ? ?Allergies    ?Patient has no known allergies.   ? ?Review of Systems   ?Review of Systems  ?Constitutional:  Negative for chills and fever.  ?HENT:  Negative for ear pain and sore throat.   ?Eyes:  Negative for pain and visual disturbance.  ?Respiratory:  Negative for cough and shortness of breath.   ?Cardiovascular:  Negative for chest pain and palpitations.  ?Gastrointestinal:  Negative for abdominal pain and vomiting.  ?Genitourinary:  Negative for dysuria, flank pain, hematuria, scrotal swelling and testicular pain.  ?Musculoskeletal:  Negative for arthralgias and back pain.  ?Skin:  Negative for color change and rash.  ?Neurological:  Negative for seizures and syncope.  ?  All other systems reviewed and are negative. ? ?Physical Exam ?Updated Vital Signs ?BP (!) 152/89 (BP Location: Right Arm)   Pulse 80   Temp 98.7 ?F (37.1 ?C) (Oral)   Ht 1.778 m ('5\' 10"'$ )   Wt 104.3 kg   SpO2 99%   BMI 33.00 kg/m?  ?Physical Exam ?Vitals and nursing note reviewed.  ?Constitutional:   ?   General: He is not in acute distress. ?   Appearance: Normal appearance. He is well-developed.  ?HENT:  ?   Head: Normocephalic and atraumatic.  ?Eyes:  ?   Extraocular Movements: Extraocular movements intact.  ?   Conjunctiva/sclera: Conjunctivae normal.  ?   Pupils: Pupils are equal, round, and reactive to  light.  ?Cardiovascular:  ?   Rate and Rhythm: Normal rate and regular rhythm.  ?   Heart sounds: No murmur heard. ?Pulmonary:  ?   Effort: Pulmonary effort is normal. No respiratory distress.  ?   Breath sounds: Normal breath sounds.  ?Abdominal:  ?   Palpations: Abdomen is soft.  ?   Tenderness: There is no abdominal tenderness.  ?Genitourinary: ?   Penis: Normal.   ?   Testes: Normal.  ?   Comments: Left groin area into at the base of the scrotum towards the perineum there are 3 areas of induration each measuring about 1 x 1 cm.  No fluctuance no significant erythema.  There is some tenderness.  No scrotal swelling.  Testes normal.  Penis without any abnormalities. ?Musculoskeletal:     ?   General: No swelling.  ?   Cervical back: Neck supple.  ?Skin: ?   General: Skin is warm and dry.  ?   Capillary Refill: Capillary refill takes less than 2 seconds.  ?Neurological:  ?   General: No focal deficit present.  ?   Mental Status: He is alert and oriented to person, place, and time.  ?Psychiatric:     ?   Mood and Affect: Mood normal.  ? ? ?ED Results / Procedures / Treatments   ?Labs ?(all labs ordered are listed, but only abnormal results are displayed) ?Labs Reviewed - No data to display ? ?EKG ?None ? ?Radiology ?No results found. ? ?Procedures ?Procedures  ? ? ?Medications Ordered in ED ?Medications - No data to display ? ?ED Course/ Medical Decision Making/ A&P ?  ?                        ?Medical Decision Making ?Risk ?Prescription drug management. ? ? ?Left groin area areas of induration consisting with developing abscesses no areas of any fluctuance.  Premature to I&D at this time.  We will treat with antibiotics.  No evidence of any scrotal involvement.  No evidence of any erythema or cellulitis in the area. ? ?Patient has scars in this area as well.  We will give him referral to general surgery.  We will start patient on doxycycline.  Continue sitz bath's.  We will have him return for any purulent head  development in the areas.  Or for any kind of scrotal swelling. ? ? ?Final Clinical Impression(s) / ED Diagnoses ?Final diagnoses:  ?Abscess of groin, left  ? ? ?Rx / DC Orders ?ED Discharge Orders   ? ?      Ordered  ?  doxycycline (VIBRAMYCIN) 100 MG capsule  2 times daily,   Status:  Discontinued       ? 01/18/22 1612  ?  doxycycline (VIBRAMYCIN) 100 MG capsule  2 times daily       ? 01/18/22 1617  ? ?  ?  ? ?  ? ? ?  ?Fredia Sorrow, MD ?01/18/22 1628 ? ?  ?Fredia Sorrow, MD ?01/18/22 1629 ? ?

## 2022-01-19 ENCOUNTER — Ambulatory Visit (HOSPITAL_COMMUNITY)
Admission: EM | Disposition: A | Discharge status: Home / Self Care | Payer: Self-pay | Source: Home / Self Care | Attending: Emergency Medicine

## 2022-01-19 ENCOUNTER — Encounter (HOSPITAL_COMMUNITY): Payer: Self-pay

## 2022-01-19 ENCOUNTER — Observation Stay (HOSPITAL_COMMUNITY)
Admission: EM | Admit: 2022-01-19 | Discharge: 2022-01-20 | Disposition: A | Discharge status: Home / Self Care | Payer: Medicaid Other | Attending: Internal Medicine | Admitting: Internal Medicine

## 2022-01-19 ENCOUNTER — Other Ambulatory Visit: Payer: Self-pay

## 2022-01-19 ENCOUNTER — Emergency Department (HOSPITAL_COMMUNITY): Payer: Medicaid Other

## 2022-01-19 DIAGNOSIS — E7800 Pure hypercholesterolemia, unspecified: Secondary | ICD-10-CM

## 2022-01-19 DIAGNOSIS — Z20822 Contact with and (suspected) exposure to covid-19: Secondary | ICD-10-CM | POA: Insufficient documentation

## 2022-01-19 DIAGNOSIS — R079 Chest pain, unspecified: Secondary | ICD-10-CM | POA: Diagnosis present

## 2022-01-19 DIAGNOSIS — Z951 Presence of aortocoronary bypass graft: Secondary | ICD-10-CM | POA: Diagnosis not present

## 2022-01-19 DIAGNOSIS — Z79899 Other long term (current) drug therapy: Secondary | ICD-10-CM | POA: Diagnosis not present

## 2022-01-19 DIAGNOSIS — I25110 Atherosclerotic heart disease of native coronary artery with unstable angina pectoris: Secondary | ICD-10-CM | POA: Diagnosis not present

## 2022-01-19 DIAGNOSIS — Z87891 Personal history of nicotine dependence: Secondary | ICD-10-CM | POA: Insufficient documentation

## 2022-01-19 DIAGNOSIS — Z7982 Long term (current) use of aspirin: Secondary | ICD-10-CM | POA: Insufficient documentation

## 2022-01-19 DIAGNOSIS — E119 Type 2 diabetes mellitus without complications: Secondary | ICD-10-CM | POA: Diagnosis not present

## 2022-01-19 DIAGNOSIS — Z7984 Long term (current) use of oral hypoglycemic drugs: Secondary | ICD-10-CM | POA: Diagnosis not present

## 2022-01-19 DIAGNOSIS — I200 Unstable angina: Secondary | ICD-10-CM | POA: Insufficient documentation

## 2022-01-19 DIAGNOSIS — I10 Essential (primary) hypertension: Secondary | ICD-10-CM | POA: Diagnosis not present

## 2022-01-19 DIAGNOSIS — Z7901 Long term (current) use of anticoagulants: Secondary | ICD-10-CM | POA: Insufficient documentation

## 2022-01-19 DIAGNOSIS — L0390 Cellulitis, unspecified: Secondary | ICD-10-CM

## 2022-01-19 DIAGNOSIS — I214 Non-ST elevation (NSTEMI) myocardial infarction: Secondary | ICD-10-CM | POA: Diagnosis not present

## 2022-01-19 DIAGNOSIS — I25710 Atherosclerosis of autologous vein coronary artery bypass graft(s) with unstable angina pectoris: Secondary | ICD-10-CM

## 2022-01-19 HISTORY — PX: LEFT HEART CATH AND CORS/GRAFTS ANGIOGRAPHY: CATH118250

## 2022-01-19 LAB — BASIC METABOLIC PANEL
Anion gap: 12 (ref 5–15)
BUN: 17 mg/dL (ref 6–20)
CO2: 21 mmol/L — ABNORMAL LOW (ref 22–32)
Calcium: 9.7 mg/dL (ref 8.9–10.3)
Chloride: 99 mmol/L (ref 98–111)
Creatinine, Ser: 1.3 mg/dL — ABNORMAL HIGH (ref 0.61–1.24)
GFR, Estimated: >60 mL/min (ref >60)
Glucose, Bld: 361 mg/dL — ABNORMAL HIGH (ref 70–99)
Potassium: 4.3 mmol/L (ref 3.5–5.1)
Sodium: 132 mmol/L — ABNORMAL LOW (ref 135–145)

## 2022-01-19 LAB — GLUCOSE, CAPILLARY
Glucose-Capillary: 339 mg/dL — ABNORMAL HIGH (ref 70–99)
Glucose-Capillary: 325 mg/dL — ABNORMAL HIGH (ref 70–99)
Glucose-Capillary: 271 mg/dL — ABNORMAL HIGH (ref 70–99)
Glucose-Capillary: 230 mg/dL — ABNORMAL HIGH (ref 70–99)

## 2022-01-19 LAB — TROPONIN I (HIGH SENSITIVITY)
Troponin I (High Sensitivity): 13 ng/L (ref <18)
Troponin I (High Sensitivity): 27 ng/L — ABNORMAL HIGH (ref <18)

## 2022-01-19 LAB — CBC
HCT: 38 % — ABNORMAL LOW (ref 39.0–52.0)
Hemoglobin: 13.3 g/dL (ref 13.0–17.0)
MCH: 31.7 pg (ref 26.0–34.0)
MCHC: 35 g/dL (ref 30.0–36.0)
MCV: 90.5 fL (ref 80.0–100.0)
Platelets: 146 10*3/uL — ABNORMAL LOW (ref 150–400)
RBC: 4.2 MIL/uL — ABNORMAL LOW (ref 4.22–5.81)
RDW: 12.6 % (ref 11.5–15.5)
WBC: 8.3 10*3/uL (ref 4.0–10.5)
nRBC: 0 % (ref 0.0–0.2)

## 2022-01-19 LAB — RESP PANEL BY RT-PCR (FLU A&B, COVID) ARPGX2
Influenza A by PCR: NEGATIVE
Influenza B by PCR: NEGATIVE
SARS Coronavirus 2 by RT PCR: NEGATIVE

## 2022-01-19 LAB — HIV ANTIBODY (ROUTINE TESTING W REFLEX): HIV Screen 4th Generation wRfx: NONREACTIVE

## 2022-01-19 LAB — CBG MONITORING, ED: Glucose-Capillary: 315 mg/dL — ABNORMAL HIGH (ref 70–99)

## 2022-01-19 SURGERY — LEFT HEART CATH AND CORS/GRAFTS ANGIOGRAPHY
Anesthesia: LOCAL

## 2022-01-19 MED ORDER — HEPARIN (PORCINE) IN NACL 1000-0.9 UT/500ML-% IV SOLN
INTRAVENOUS | Status: AC
  Filled 2022-01-19: qty 1000

## 2022-01-19 MED ORDER — INSULIN ASPART 100 UNIT/ML IJ SOLN
2.0000 [IU] | Freq: Three times a day (TID) | INTRAMUSCULAR | Status: DC
  Administered 2022-01-19 – 2022-01-20 (×2): 2 [IU] via SUBCUTANEOUS

## 2022-01-19 MED ORDER — INSULIN ASPART 100 UNIT/ML IJ SOLN
INTRAMUSCULAR | Status: AC
  Filled 2022-01-19: qty 1

## 2022-01-19 MED ORDER — MAGNESIUM HYDROXIDE 400 MG/5ML PO SUSP
30.0000 mL | Freq: Every day | ORAL | Status: DC | PRN
  Administered 2022-01-19: 30 mL via ORAL
  Filled 2022-01-19: qty 30

## 2022-01-19 MED ORDER — ATORVASTATIN CALCIUM 40 MG PO TABS
40.0000 mg | ORAL_TABLET | Freq: Every day | ORAL | Status: DC
  Administered 2022-01-19 – 2022-01-20 (×2): 40 mg via ORAL
  Filled 2022-01-19 (×2): qty 1

## 2022-01-19 MED ORDER — FENTANYL CITRATE (PF) 100 MCG/2ML IJ SOLN
INTRAMUSCULAR | Status: AC
  Filled 2022-01-19: qty 2

## 2022-01-19 MED ORDER — NITROGLYCERIN IN D5W 200-5 MCG/ML-% IV SOLN
0.0000 ug/min | INTRAVENOUS | Status: DC
  Administered 2022-01-19: 5 ug/min via INTRAVENOUS
  Filled 2022-01-19: qty 250

## 2022-01-19 MED ORDER — NITROGLYCERIN 0.4 MG SL SUBL
0.4000 mg | SUBLINGUAL_TABLET | SUBLINGUAL | Status: DC | PRN
  Administered 2022-01-19 (×3): 0.4 mg via SUBLINGUAL
  Filled 2022-01-19 (×3): qty 1

## 2022-01-19 MED ORDER — HEPARIN SODIUM (PORCINE) 1000 UNIT/ML IJ SOLN
INTRAMUSCULAR | Status: AC
  Filled 2022-01-19: qty 10

## 2022-01-19 MED ORDER — SODIUM CHLORIDE 0.9 % WEIGHT BASED INFUSION: 3.0000 mL/kg/h | INTRAVENOUS | Status: DC

## 2022-01-19 MED ORDER — FUROSEMIDE 10 MG/ML IJ SOLN
40.0000 mg | Freq: Every day | INTRAMUSCULAR | Status: DC
  Administered 2022-01-20: 40 mg via INTRAVENOUS
  Filled 2022-01-19: qty 4

## 2022-01-19 MED ORDER — HYDRALAZINE HCL 20 MG/ML IJ SOLN: 10.0000 mg | INTRAMUSCULAR | Status: AC | PRN

## 2022-01-19 MED ORDER — INSULIN GLARGINE-YFGN 100 UNIT/ML ~~LOC~~ SOLN
10.0000 [IU] | Freq: Every day | SUBCUTANEOUS | Status: DC
  Administered 2022-01-19: 10 [IU] via SUBCUTANEOUS
  Filled 2022-01-19 (×2): qty 0.1

## 2022-01-19 MED ORDER — MIDAZOLAM HCL 2 MG/2ML IJ SOLN
INTRAMUSCULAR | Status: AC
  Filled 2022-01-19: qty 2

## 2022-01-19 MED ORDER — MORPHINE SULFATE (PF) 4 MG/ML IV SOLN
4.0000 mg | Freq: Once | INTRAVENOUS | Status: AC
  Administered 2022-01-19: 4 mg via INTRAVENOUS
  Filled 2022-01-19: qty 1

## 2022-01-19 MED ORDER — ASPIRIN EC 81 MG PO TBEC
81.0000 mg | DELAYED_RELEASE_TABLET | Freq: Every day | ORAL | Status: DC
  Administered 2022-01-20: 81 mg via ORAL
  Filled 2022-01-19: qty 1

## 2022-01-19 MED ORDER — MIDAZOLAM HCL 2 MG/2ML IJ SOLN
INTRAMUSCULAR | Status: DC | PRN
  Administered 2022-01-19: 2 mg via INTRAVENOUS

## 2022-01-19 MED ORDER — SODIUM CHLORIDE 0.9 % WEIGHT BASED INFUSION: 1.0000 mL/kg/h | INTRAVENOUS | Status: DC

## 2022-01-19 MED ORDER — ONDANSETRON HCL 4 MG/2ML IJ SOLN
4.0000 mg | Freq: Four times a day (QID) | INTRAMUSCULAR | Status: DC | PRN
  Administered 2022-01-19: 4 mg via INTRAVENOUS
  Filled 2022-01-19: qty 2

## 2022-01-19 MED ORDER — AMLODIPINE BESYLATE 10 MG PO TABS
10.0000 mg | ORAL_TABLET | Freq: Every day | ORAL | Status: DC
  Administered 2022-01-19 – 2022-01-20 (×2): 10 mg via ORAL
  Filled 2022-01-19: qty 1
  Filled 2022-01-19: qty 2

## 2022-01-19 MED ORDER — FENOFIBRATE 54 MG PO TABS
54.0000 mg | ORAL_TABLET | Freq: Every day | ORAL | Status: DC
  Administered 2022-01-19 – 2022-01-20 (×2): 54 mg via ORAL
  Filled 2022-01-19 (×2): qty 1

## 2022-01-19 MED ORDER — LIDOCAINE HCL (PF) 1 % IJ SOLN
INTRAMUSCULAR | Status: DC | PRN
  Administered 2022-01-19: 15 mL

## 2022-01-19 MED ORDER — HEPARIN (PORCINE) IN NACL 1000-0.9 UT/500ML-% IV SOLN
INTRAVENOUS | Status: DC | PRN
  Administered 2022-01-19 (×2): 500 mL

## 2022-01-19 MED ORDER — INSULIN ASPART 100 UNIT/ML IJ SOLN
0.0000 [IU] | Freq: Three times a day (TID) | INTRAMUSCULAR | Status: DC
  Administered 2022-01-19 (×2): 11 [IU] via SUBCUTANEOUS

## 2022-01-19 MED ORDER — SODIUM CHLORIDE 0.9% FLUSH: 3.0000 mL | INTRAVENOUS | Status: DC | PRN

## 2022-01-19 MED ORDER — SODIUM CHLORIDE 0.9 % WEIGHT BASED INFUSION
3.0000 mL/kg/h | INTRAVENOUS | Status: DC
  Administered 2022-01-19: 3 mL/kg/h via INTRAVENOUS

## 2022-01-19 MED ORDER — DOXYCYCLINE HYCLATE 100 MG PO TABS
100.0000 mg | ORAL_TABLET | Freq: Two times a day (BID) | ORAL | Status: DC
  Administered 2022-01-19 – 2022-01-20 (×3): 100 mg via ORAL
  Filled 2022-01-19 (×3): qty 1

## 2022-01-19 MED ORDER — SODIUM CHLORIDE 0.9 % IV SOLN: 250.0000 mL | INTRAVENOUS | Status: DC | PRN

## 2022-01-19 MED ORDER — INSULIN ASPART 100 UNIT/ML IJ SOLN: 0.0000 [IU] | Freq: Every day | INTRAMUSCULAR | Status: DC

## 2022-01-19 MED ORDER — VERAPAMIL HCL 2.5 MG/ML IV SOLN
INTRAVENOUS | Status: AC
  Filled 2022-01-19: qty 2

## 2022-01-19 MED ORDER — SODIUM CHLORIDE 0.9% FLUSH
3.0000 mL | Freq: Two times a day (BID) | INTRAVENOUS | Status: DC
  Administered 2022-01-19 – 2022-01-20 (×2): 3 mL via INTRAVENOUS

## 2022-01-19 MED ORDER — RANOLAZINE ER 500 MG PO TB12
500.0000 mg | ORAL_TABLET | Freq: Two times a day (BID) | ORAL | Status: DC
  Administered 2022-01-19 – 2022-01-20 (×2): 500 mg via ORAL
  Filled 2022-01-19 (×2): qty 1

## 2022-01-19 MED ORDER — INSULIN ASPART 100 UNIT/ML IJ SOLN
0.0000 [IU] | Freq: Every day | INTRAMUSCULAR | Status: DC
  Administered 2022-01-19: 2 [IU] via SUBCUTANEOUS

## 2022-01-19 MED ORDER — ISOSORBIDE MONONITRATE ER 60 MG PO TB24
60.0000 mg | ORAL_TABLET | Freq: Every day | ORAL | Status: DC
  Administered 2022-01-19 – 2022-01-20 (×2): 60 mg via ORAL
  Filled 2022-01-19 (×2): qty 1

## 2022-01-19 MED ORDER — ENOXAPARIN SODIUM 40 MG/0.4ML IJ SOSY
40.0000 mg | PREFILLED_SYRINGE | INTRAMUSCULAR | Status: DC
  Administered 2022-01-19 – 2022-01-20 (×2): 40 mg via SUBCUTANEOUS
  Filled 2022-01-19 (×2): qty 0.4

## 2022-01-19 MED ORDER — IOHEXOL 350 MG/ML SOLN
INTRAVENOUS | Status: DC | PRN
  Administered 2022-01-19: 80 mL

## 2022-01-19 MED ORDER — SODIUM CHLORIDE 0.9% FLUSH
3.0000 mL | Freq: Two times a day (BID) | INTRAVENOUS | Status: DC
  Administered 2022-01-20: 3 mL via INTRAVENOUS

## 2022-01-19 MED ORDER — ACETAMINOPHEN 325 MG PO TABS
650.0000 mg | ORAL_TABLET | ORAL | Status: DC | PRN
  Administered 2022-01-20 (×2): 650 mg via ORAL
  Filled 2022-01-19 (×2): qty 2

## 2022-01-19 MED ORDER — INSULIN ASPART 100 UNIT/ML IJ SOLN
0.0000 [IU] | Freq: Three times a day (TID) | INTRAMUSCULAR | Status: DC
  Administered 2022-01-19 – 2022-01-20 (×2): 8 [IU] via SUBCUTANEOUS

## 2022-01-19 MED ORDER — LABETALOL HCL 5 MG/ML IV SOLN: 10.0000 mg | INTRAVENOUS | Status: AC | PRN

## 2022-01-19 MED ORDER — OMEGA-3-ACID ETHYL ESTERS 1 G PO CAPS
2.0000 g | ORAL_CAPSULE | Freq: Two times a day (BID) | ORAL | Status: DC
  Administered 2022-01-19 – 2022-01-20 (×2): 2 g via ORAL
  Filled 2022-01-19 (×4): qty 2

## 2022-01-19 MED ORDER — FENTANYL CITRATE (PF) 100 MCG/2ML IJ SOLN
INTRAMUSCULAR | Status: DC | PRN
  Administered 2022-01-19: 50 ug via INTRAVENOUS

## 2022-01-19 MED ORDER — LIDOCAINE HCL (PF) 1 % IJ SOLN
INTRAMUSCULAR | Status: AC
  Filled 2022-01-19: qty 30

## 2022-01-19 MED ORDER — METOPROLOL TARTRATE 100 MG PO TABS
100.0000 mg | ORAL_TABLET | Freq: Two times a day (BID) | ORAL | Status: DC
  Administered 2022-01-19 – 2022-01-20 (×3): 100 mg via ORAL
  Filled 2022-01-19: qty 4
  Filled 2022-01-19 (×2): qty 1

## 2022-01-19 SURGICAL SUPPLY — 12 items
CATH INFINITI 5 FR IM (CATHETERS) ×2 IMPLANT
CATH INFINITI 5FR MULTPACK ANG (CATHETERS) ×2 IMPLANT
CLOSURE MYNX CONTROL 5F (Vascular Products) ×2 IMPLANT
KIT HEART LEFT (KITS) ×3 IMPLANT
KIT MICROPUNCTURE NIT STIFF (SHEATH) ×2 IMPLANT
PACK CARDIAC CATHETERIZATION (CUSTOM PROCEDURE TRAY) ×3 IMPLANT
SHEATH PINNACLE 5F 10CM (SHEATH) ×2 IMPLANT
SHEATH PROBE COVER 6X72 (BAG) ×2 IMPLANT
TRANSDUCER W/STOPCOCK (MISCELLANEOUS) ×3 IMPLANT
TUBING CIL FLEX 10 FLL-RA (TUBING) ×3 IMPLANT
WIRE EMERALD 3MM-J .035X150CM (WIRE) ×2 IMPLANT
WIRE EMERALD 3MM-J .035X260CM (WIRE) ×2 IMPLANT

## 2022-01-19 NOTE — Progress Notes (Signed)
Inpatient Diabetes Program Recommendations ? ?AACE/ADA: New Consensus Statement on Inpatient Glycemic Control (2015) ? ?Target Ranges:  Prepandial:   less than 140 mg/dL ?     Peak postprandial:   less than 180 mg/dL (1-2 hours) ?     Critically ill patients:  140 - 180 mg/dL  ? ?Lab Results  ?Component Value Date  ? GLUCAP 315 (H) 01/19/2022  ? HGBA1C 7.4 (H) 07/31/2016  ? ? ?Review of Glycemic Control ? Latest Reference Range & Units 01/19/22 11:52  ?Glucose-Capillary 70 - 99 mg/dL 315 (H)  ? ?Diabetes history:  ?DM 2 ?Outpatient Diabetes medications:  ?Amaryl 4 mg daily, Metformin 1000 mg bid ?Current orders for Inpatient glycemic control:  ?Novolog moderate tid with meals and HS ? ?Inpatient Diabetes Program Recommendations:   ? ?Note patient is NPO.  Consider changing Novolog to q 4 hours.  ?A1C pending.  Patient may need addition of basal insulin as well.  Consider adding Semglee 20 units daily if blood sugars remain>180 mg/dL.  ? ?Thanks,  ?Adah Perl, RN, BC-ADM ?Inpatient Diabetes Coordinator ?Pager [phone]  (8a-5p) ? ? ?

## 2022-01-19 NOTE — Progress Notes (Addendum)
See MAR for Insulin given, Dr Dareen Piano at bedside, RN informed doctor that insulin may need to be adjusted, verbal acknowledgement noted, safety maintained ? ?

## 2022-01-19 NOTE — Hospital Course (Signed)
Admitted 01/19/2022  Allergies: Patient has no known allergies. ?Pertinent Hx: hypertension, hyperlipidemia, type 2 diabetes, 20-pack-year history of tobacco use disorder who quit smoking in 2005,  CAD with prior CABG (2014, LIMA to LAD, L rad to RPDA, SVG OM1, SVG OM2) and s/p PCI to the Lcx in 2016)  ? ?61 y.o. male p/w chest pain ? ?*Unstable angina patient taken for cardiac cath that showed complete occlusion of left circumflex, no option for PCI.  LV EDP 28, Dr. Angelena Form gave Lasix and recommended to continue diuresis tomorrow once evaluating patient.  ? ?*Type 2 diabetes: SSI ? ?*Scrotal nodule: Favor cyst vs infection. Patient seen in ED day ago and started on doxycycline.  I continued this doxycycline will need to evaluate patient's improving. If not consult surgery . ? ?Consults: Cardiology ?Meds: ASA, atorvastatin, amlodipine, insulin, Lopressor VTE ppx: Lovenox IVF: None diet: NPO ? ?

## 2022-01-19 NOTE — Consult Note (Signed)
Cardiology Admission History and Physical:   Patient ID: George [name] MRN: [id number]; DOB: [birth year]   Admission date: 01/19/2022  PCP:  Alanson Puls, [facility] Cardiologist:  Werner Lean, MD      Chief Complaint:  Chest Pain  Patient Profile:   George [name] is a 61 y.o. male with a history of CAD with prior CABG (2014, LIMA to LAD, L rad to RPDA, SVG OM1, SVG OM2) and s/p PCI to the Lcx in 2016, HTN, DM, HLD, former tobacco use, and marijuana use who is being seen 01/19/2022 for the evaluation of chest pain.  History of Present Illness:   George [name] is a 61 year old male with above medical history who is followed by Dr. Gasper Sells. Per chart review, in 2005 the patient had an NSTEMI and cardiac catheterization with DES to the RCA. Patient was overall stable until February 2014 when the patient was admitted for evaluation of chest pain. Underwent a LHC that showed severe 3 vessel disease, so he had a CABG with LIMA to LAD, L rad to RPDA, SVG OM1, SVG OM2. Later, in April 2015, patient developed progressive dyspnea on exertion and underwent an echocardiogram exercise stress test which had poor visualization of the myocardium, with possible anteroseptal hypokinesis. He also had below average functional capacity. Resting left ventricular systolic function was normal. Patient continues to have symptoms of SOB on exertion and some chest discomfort, so he underwent a LHC on 04/07/2015 with successful PCI of the native Lcx. Follow up echocardiogram on 08/03/2015 showed an EF of 60-65%, normal LV diastolic parameters. Patient was lost to follow up from 2017, but reestablished care on 09/14/2021 with Dr. Gasper Sells and was evaluated for chest pain, chose to defer cath and pursue medical management with imdur. Patient was most recently seen in office on 12/19/2021, and at that time was continuing to have chest pain with exertion/sexual activity. Patient continued to  have severe anxiety about heart catheterizations, so he did continued to request medical therapy. Patient had an echocardiogram on 01/12/22 that showed EF 55-60%, mild LVH, no significant valvular abnormalities  Patient presented to the ED via [facility] complaining of chest pain for the past 2 days. This morning, patient had sudden onset of sharp/stabbing chest pain that radiated to the right side. Patient also has scrotal cellulitis   Labs in the ED showed Na 132, K 4.3, creatinine 1.30, hemoglobin 13.3, WBC 8.3. hsTn 13 EKG showed sinus rhythm, mild ST depression in leads I, II, aVL, V2-V6 (likely global ischemia)  CXR showed no radiographic evidence of acute cardiopulmonary disease, aortic atherosclerosis.   On interview, patient reports that he has been having chest pain on exertion for over a year.  Has noticed it has been more frequent lately.  Associated with shortness of breath.  Chest pain is usually resolved with nitroglycerin.  However, last night around 5 PM patient developed very intense substernal chest pain that radiated across his chest into his left arm.  Describes the pain as sharp, burning, pressure.  Associated with shortness of breath.  Pain was not relieved with nitroglycerin.  Continued to have intermittent chest pain throughout the night and into this morning.   Past Medical History:  Diagnosis Date   Coronary artery disease    a. MI 2005 s/p prior RCA stenting;  b. s/p CABG x 4 (LIMA->LAD, Radial->RPDA, VG->OM1->OM2);  c. 03/2015 Cath/PCI: LAD 50p/m, LCX 99p (3.0x20 Promus Premier DES), OM2 100, RCA  147m Radial->RPDA nl, LIMA->LAD nl, VG->OM1->OM2 100, EF 55-65%.   Dyslipidemia    History of kidney stones    Hyperlipidemia    Hypertension    Hypertriglyceridemia    Marijuana abuse    Tobacco abuse    Type II diabetes mellitus ([place]     Past Surgical History:  Procedure Laterality Date   ANGIOPLASTY     CARDIAC CATHETERIZATION N/A 04/07/2015   Procedure:  Right/Left Heart Cath and Coronary/Graft Angiography;  Surgeon: MSherren Mocha MD;  [facility];  Service: Cardiovascular;  Laterality: N/A;   CARDIAC CATHETERIZATION N/A 04/07/2015   Procedure: Coronary Stent Intervention;  Surgeon: MSherren Mocha MD;  [facility];  Service: Cardiovascular;  Laterality: N/A;   COLONOSCOPY N/A 06/29/2013   Procedure: COLONOSCOPY;  Surgeon: SDanie Binder MD;  [facility];  Service: Endoscopy;  Laterality: N/A;  10:45 AM   CORONARY ANGIOPLASTY     CORONARY ARTERY BYPASS GRAFT N/A 12/31/2012   Procedure: CORONARY ARTERY BYPASS GRAFTING (CABG);  Surgeon: SMelrose Nakayama MD;  [place] [facility]  Service: Open Heart Surgery;  Laterality: N/A;  times four using left internal mammary artery, left radial artery, endoscopically harvested right saphenous vein   CORONARY STENT PLACEMENT  2005   LAD50/70, CFX OK, PL1 90, PL2 40, RCA 95->0 with 2.5 x 60 mm Taxus stent, EF 60   LEFT HEART CATHETERIZATION WITH CORONARY ANGIOGRAM N/A 12/30/2012   Procedure: LEFT HEART CATHETERIZATION WITH CORONARY ANGIOGRAM;  Surgeon: PJosue Hector MD;  [facility];  Service: Cardiovascular;  Laterality: N/A;   LUMBAR SPINE SURGERY  2005   PERCUTANEOUS CORONARY STENT INTERVENTION (PCI-S)  04/07/2015   native left circumfles with DES   RADIAL ARTERY HARVEST Left 12/31/2012   Procedure: RADIAL ARTERY HARVEST;  Surgeon: SMelrose Nakayama MD;  [place] [facility]  Service: Open Heart Surgery;  Laterality: Left;     Medications Prior to Admission: Prior to Admission medications   Medication Sig Start Date End Date Taking? Authorizing Provider  amLODipine (NORVASC) 10 MG tablet Take 10 mg by mouth daily. 10/18/21   [provider]  aspirin EC 81 MG tablet Take 1 tablet (81 mg total) by mouth daily. 05/26/14   KHerminio Commons MD  atorvastatin (LIPITOR) 80 MG tablet Take 40 mg by mouth daily.    [provider]   doxycycline (VIBRAMYCIN) 100 MG capsule Take 1 capsule (100 mg total) by mouth 2 (two) times daily. 01/18/22   ZFredia Sorrow MD  fenofibrate (TRICOR) 48 MG tablet Take 48 mg by mouth daily. 06/21/21   [provider]  glimepiride (AMARYL) 2 MG tablet Take 2 mg by mouth daily. 06/21/21   [provider]  isosorbide mononitrate (IMDUR) 60 MG 24 hr tablet Take 1 tablet (60 mg total) by mouth daily. 12/19/21   Chandrasekhar, Mahesh A, MD  lisinopril (ZESTRIL) 40 MG tablet Take 40 mg by mouth daily. 12/14/21   [provider]  metFORMIN (GLUCOPHAGE) 1000 MG tablet Take 1 tablet (1,000 mg total) by mouth 2 (two) times daily with a meal. 04/08/15   BTheora Gianotti NP  metoprolol tartrate (LOPRESSOR) 100 MG tablet Take 100 mg by mouth 2 (two) times daily. 11/21/21   [provider]  nitroGLYCERIN (NITROSTAT) 0.4 MG SL tablet Place 1 tablet (0.4 mg total) under the tongue every 5 (five) minutes x 3 doses as needed for chest pain (if no relief after 2nd dose, proceed to  ED or call 911). 12/19/21   Chandrasekhar, Terisa Starr, MD  omega-3 acid ethyl esters (LOVAZA) 1 g capsule Take 2 capsules (2 g total) by mouth 2 (two) times daily. 09/18/21   Chandrasekhar, Mahesh A, MD  TRADJENTA 5 MG TABS tablet Take 5 mg by mouth daily. 06/19/21   [provider]     Allergies:   No Known Allergies  Social History:   Social History   Socioeconomic History   Marital status: Married    Spouse name: Not on file   Number of children: Not on file   Years of education: Not on file   Highest education level: Not on file  Occupational History   Occupation: Disabled  Tobacco Use   Smoking status: Former    Packs/day: 2.00    Years: 10.00    Pack years: 20.00    Types: Cigars, Cigarettes    Start date: 06/16/1994    Quit date: 06/12/2004    Years since quitting: 17.6   Smokeless tobacco: Never  Vaping Use   Vaping Use: Never used  Substance and Sexual Activity   Alcohol  use: No    Alcohol/week: 0.0 standard drinks    Comment: Occasional   Drug use: Not Currently    Types: Marijuana    Comment: Last time-02/2013   Sexual activity: Yes    Partners: Female, Male  Other Topics Concern   Not on file  Social History Narrative   Married   Sedentary   Social Determinants of Health   Financial Resource Strain: Not on file  Food Insecurity: Not on file  Transportation Needs: Not on file  Physical Activity: Not on file  Stress: Not on file  Social Connections: Not on file  Intimate Partner Violence: Not on file    Family History:   The patient's family history includes Cancer in his father; Diabetes in his mother; Heart disease in his mother. There is no history of Colon cancer.    ROS:  Please see the history of present illness.  All other ROS reviewed and negative.     Physical Exam/Data:   Vitals:   01/19/22 0800 01/19/22 0830 01/19/22 0900 01/19/22 0936  BP: (!) 170/82 (!) 154/81 (!) 156/84 (!) 173/86  Pulse: 82 86 88 97  Resp: 18 16 (!) 30   Temp:      TempSrc:      SpO2: 96% 94% 98%   Weight:      Height:       No intake or output data in the 24 hours ending 01/19/22 0951 Last 3 Weights 01/19/2022 01/18/2022 12/19/2021  Weight (lbs) 229 lb 15 oz 230 lb 228 lb  Weight (kg) 104.3 kg 104.327 kg 103.42 kg     Body mass index is 32.99 kg/m.  General:  Well nourished, well developed. Mildly distressed, breathing is heavy  HEENT: normal Neck: no JVD, supple Vascular: Right radial pulse 2+, no left radial pulse (s/p left radial artery harvesting) Cardiac:  normal S1, S2; RRR; grade 2/6 systolic murmur. Some chest wall tenderness  Lungs:  clear to auscultation bilaterally, no wheezing, rhonchi or rales  Abd: soft, tender to deep palpation, no hepatomegaly  Ext: no edema Musculoskeletal:  No deformities, BUE and BLE strength normal and equal Skin: warm and dry. Scrotum is erythematous and mildly swollen, no exudate  Neuro:  CNs 2-12 intact,  no focal abnormalities noted Psych:  Normal affect    EKG:  The ECG that was done 3/10 was  personally reviewed and demonstrates sinus rhythm, mild ST depression in leads I, II, aVL, V2-V6 (likely global ischemia)   Relevant CV Studies:  Echocardiogram 01/12/22   1. Left ventricular ejection fraction, by estimation, is 55 to 60%. The  left ventricle has normal function. The left ventricle has no regional  wall motion abnormalities. There is mild left ventricular hypertrophy.  Left ventricular diastolic parameters  were normal.   2. Right ventricular systolic function is normal. The right ventricular  size is normal. Tricuspid regurgitation signal is inadequate for assessing  PA pressure.   3. The mitral valve is normal in structure. No evidence of mitral valve  regurgitation. No evidence of mitral stenosis.   4. The aortic valve is tricuspid. There is mild calcification of the  aortic valve. There is mild thickening of the aortic valve. Aortic valve  regurgitation is not visualized. Aortic valve sclerosis is present, with  no evidence of aortic valve stenosis.   Laboratory Data:  High Sensitivity Troponin:   Recent Labs  Lab 01/19/22 0715  TROPONINIHS 13      Chemistry Recent Labs  Lab 01/19/22 0715  NA 132*  K 4.3  CL 99  CO2 21*  GLUCOSE 361*  BUN 17  CREATININE 1.30*  CALCIUM 9.7  GFRNONAA >60  ANIONGAP 12    No results for input(s): PROT, ALBUMIN, AST, ALT, ALKPHOS, BILITOT in the last 168 hours. Lipids No results for input(s): CHOL, TRIG, HDL, LABVLDL, LDLCALC, CHOLHDL in the last 168 hours. Hematology Recent Labs  Lab 01/19/22 0715  WBC 8.3  RBC 4.20*  HGB 13.3  HCT 38.0*  MCV 90.5  MCH 31.7  MCHC 35.0  RDW 12.6  PLT 146*   Thyroid No results for input(s): TSH, FREET4 in the last 168 hours. BNPNo results for input(s): BNP, PROBNP in the last 168 hours.  DDimer No results for input(s): DDIMER in the last 168 hours.   Radiology/Studies:  DG Chest  2 View  Result Date: 01/19/2022 CLINICAL DATA:  61 year old male with history of chest pain. EXAM: CHEST - 2 VIEW COMPARISON:  Chest x-ray 02/17/2013. FINDINGS: Lung volumes are normal. No consolidative airspace disease. No pleural effusions. No pneumothorax. No pulmonary nodule or mass noted. Pulmonary vasculature and the cardiomediastinal silhouette are within normal limits. Atherosclerosis in the thoracic aorta. Status post median sternotomy for CABG. IMPRESSION: 1. No radiographic evidence of acute cardiopulmonary disease. 2. Aortic atherosclerosis. Electronically Signed   By: Vinnie Langton M.D.   On: 01/19/2022 07:47     Assessment and Plan:   Chest Pain, unstable angina  CAD s/p CABG in 2014, PCI to Lcx in 2016  - Patient reports that he has been having chest pain and SOB on exertion for over a year, episodes have become more frequent in the past month. Developed chest pain yesterday evening that was described as sharp, burning, pressure. Located substernally and radiated across chest and to the left shoulder. Not relieved with SL nitro or rest. Symptoms consistent with unstable, progressive angina   - EKG showed sinus rhythm, mild ST depression in leads I, II, aVL, V2-V6 (likely global ischemia)  - Echo completed on 01/12/22 showed showed EF 55-60%, mild LVH, no significant valvular abnormalities - hsTn 13>>27 - Continue asa  - Continue BP medications (amlodipine, lopressor, lisinopril)  - Continue lipitor, tricor, Lovaza   - Order lipid panel and A1c to assess cardiac risk factors (last drawn in 2017) - Recommend left heart catheterization. Patient has been hesitant to have  cardiac caths in the past due to anxiety over past CABG/caths, but he is agreeable today given the increased severity of his symptoms  - Patient had a CABG with left radial artery harvest, used his right groin in 2016. Patient has scrotal cellulitis currently, being treated with abx. Will discuss with MD ability to use  right groin  - Started IV nitro, patient NPO    HTN  - BP has been elevated in the ED  - Continue home amlodipine 10 mg daily  - Continue metoprolol tartrated 100 mg BID  - Continue Imdur 60 mg daily  - Continue lisinopril 40 mg daily  - On IV nitro   HLD  - Continue lipitor, tricor, lovaza   Otherwise per primary  - Scrotal cellulitis  - DM     Risk Assessment/Risk Scores:   TIMI Risk Score for Unstable Angina or Non-ST Elevation MI:   The patient's TIMI risk score is 4, which indicates a 20% risk of all cause mortality, new or recurrent myocardial infarction or need for urgent revascularization in the next 14 days.      For questions or updates, please contact [place] Please consult [email or web address] for contact info under     Signed, Margie Billet, PA-C  01/19/2022 9:51 AM

## 2022-01-19 NOTE — H&P (View-Only) (Signed)
Cardiology Admission History and Physical:   Patient ID: EVERETT EHRLER MRN: [id number]; DOB: 09-14-1961   Admission date: 01/19/2022  PCP:  Alanson Puls, [facility] Cardiologist:  Werner Lean, MD      Chief Complaint:  Chest Pain  Patient Profile:   DAYLAN JUHNKE is a 61 y.o. male with a history of CAD with prior CABG (2014, LIMA to LAD, L rad to RPDA, SVG OM1, SVG OM2) and s/p PCI to the Lcx in 2016, HTN, DM, HLD, former tobacco use, and marijuana use who is being seen 01/19/2022 for the evaluation of chest pain.  History of Present Illness:   Mr. Hankerson is a 60 year old male with above medical history who is followed by Dr. Gasper Sells. Per chart review, in 2005 the patient had an NSTEMI and cardiac catheterization with DES to the RCA. Patient was overall stable until February 2014 when the patient was admitted for evaluation of chest pain. Underwent a LHC that showed severe 3 vessel disease, so he had a CABG with LIMA to LAD, L rad to RPDA, SVG OM1, SVG OM2. Later, in April 2015, patient developed progressive dyspnea on exertion and underwent an echocardiogram exercise stress test which had poor visualization of the myocardium, with possible anteroseptal hypokinesis. He also had below average functional capacity. Resting left ventricular systolic function was normal. Patient continues to have symptoms of SOB on exertion and some chest discomfort, so he underwent a LHC on 04/07/2015 with successful PCI of the native Lcx. Follow up echocardiogram on 08/03/2015 showed an EF of 60-65%, normal LV diastolic parameters. Patient was lost to follow up from 2017, but reestablished care on 09/14/2021 with Dr. Gasper Sells and was evaluated for chest pain, chose to defer cath and pursue medical management with imdur. Patient was most recently seen in office on 12/19/2021, and at that time was continuing to have chest pain with exertion/sexual activity. Patient continued to  have severe anxiety about heart catheterizations, so he did continued to request medical therapy. Patient had an echocardiogram on 01/12/22 that showed EF 55-60%, mild LVH, no significant valvular abnormalities  Patient presented to the ED via [facility] complaining of chest pain for the past 2 days. This morning, patient had sudden onset of sharp/stabbing chest pain that radiated to the right side. Patient also has scrotal cellulitis   Labs in the ED showed Na 132, K 4.3, creatinine 1.30, hemoglobin 13.3, WBC 8.3. hsTn 13 EKG showed sinus rhythm, mild ST depression in leads I, II, aVL, V2-V6 (likely global ischemia)  CXR showed no radiographic evidence of acute cardiopulmonary disease, aortic atherosclerosis.   On interview, patient reports that he has been having chest pain on exertion for over a year.  Has noticed it has been more frequent lately.  Associated with shortness of breath.  Chest pain is usually resolved with nitroglycerin.  However, last night around 5 PM patient developed very intense substernal chest pain that radiated across his chest into his left arm.  Describes the pain as sharp, burning, pressure.  Associated with shortness of breath.  Pain was not relieved with nitroglycerin.  Continued to have intermittent chest pain throughout the night and into this morning.   Past Medical History:  Diagnosis Date   Coronary artery disease    a. MI 2005 s/p prior RCA stenting;  b. s/p CABG x 4 (LIMA->LAD, Radial->RPDA, VG->OM1->OM2);  c. 03/2015 Cath/PCI: LAD 50p/m, LCX 99p (3.0x20 Promus Premier DES), OM2 100, RCA  142m Radial->RPDA nl, LIMA->LAD nl, VG->OM1->OM2 100, EF 55-65%.   Dyslipidemia    History of kidney stones    Hyperlipidemia    Hypertension    Hypertriglyceridemia    Marijuana abuse    Tobacco abuse    Type II diabetes mellitus ([facility]     Past Surgical History:  Procedure Laterality Date   ANGIOPLASTY     CARDIAC CATHETERIZATION N/A 04/07/2015   Procedure:  Right/Left Heart Cath and Coronary/Graft Angiography;  Surgeon: MSherren Mocha MD;  [facility];  Service: Cardiovascular;  Laterality: N/A;   CARDIAC CATHETERIZATION N/A 04/07/2015   Procedure: Coronary Stent Intervention;  Surgeon: MSherren Mocha MD;  [facility];  Service: Cardiovascular;  Laterality: N/A;   COLONOSCOPY N/A 06/29/2013   Procedure: COLONOSCOPY;  Surgeon: SDanie Binder MD;  [facility];  Service: Endoscopy;  Laterality: N/A;  10:45 AM   CORONARY ANGIOPLASTY     CORONARY ARTERY BYPASS GRAFT N/A 12/31/2012   Procedure: CORONARY ARTERY BYPASS GRAFTING (CABG);  Surgeon: SMelrose Nakayama MD;  [facility]  Service: Open Heart Surgery;  Laterality: N/A;  times four using left internal mammary artery, left radial artery, endoscopically harvested right saphenous vein   CORONARY STENT PLACEMENT  2005   LAD50/70, CFX OK, PL1 90, PL2 40, RCA 95->0 with 2.5 x 60 mm Taxus stent, EF 60   LEFT HEART CATHETERIZATION WITH CORONARY ANGIOGRAM N/A 12/30/2012   Procedure: LEFT HEART CATHETERIZATION WITH CORONARY ANGIOGRAM;  Surgeon: PJosue Hector MD;  [facility];  Service: Cardiovascular;  Laterality: N/A;   LUMBAR SPINE SURGERY  2005   PERCUTANEOUS CORONARY STENT INTERVENTION (PCI-S)  04/07/2015   native left circumfles with DES   RADIAL ARTERY HARVEST Left 12/31/2012   Procedure: RADIAL ARTERY HARVEST;  Surgeon: SMelrose Nakayama MD;  [place]  Service: Open Heart Surgery;  Laterality: Left;     Medications Prior to Admission: Prior to Admission medications   Medication Sig Start Date End Date Taking? Authorizing Provider  amLODipine (NORVASC) 10 MG tablet Take 10 mg by mouth daily. 10/18/21   [provider]  aspirin EC 81 MG tablet Take 1 tablet (81 mg total) by mouth daily. 05/26/14   KHerminio Commons MD  atorvastatin (LIPITOR) 80 MG tablet Take 40 mg by mouth daily.    [provider]   doxycycline (VIBRAMYCIN) 100 MG capsule Take 1 capsule (100 mg total) by mouth 2 (two) times daily. 01/18/22   ZFredia Sorrow MD  fenofibrate (TRICOR) 48 MG tablet Take 48 mg by mouth daily. 06/21/21   [provider]  glimepiride (AMARYL) 2 MG tablet Take 2 mg by mouth daily. 06/21/21   [provider]  isosorbide mononitrate (IMDUR) 60 MG 24 hr tablet Take 1 tablet (60 mg total) by mouth daily. 12/19/21   Chandrasekhar, Mahesh A, MD  lisinopril (ZESTRIL) 40 MG tablet Take 40 mg by mouth daily. 12/14/21   [provider]  metFORMIN (GLUCOPHAGE) 1000 MG tablet Take 1 tablet (1,000 mg total) by mouth 2 (two) times daily with a meal. 04/08/15   BTheora Gianotti NP  metoprolol tartrate (LOPRESSOR) 100 MG tablet Take 100 mg by mouth 2 (two) times daily. 11/21/21   [provider]  nitroGLYCERIN (NITROSTAT) 0.4 MG SL tablet Place 1 tablet (0.4 mg total) under the tongue every 5 (five) minutes x 3 doses as needed for chest pain (if no relief after 2nd dose, proceed to  ED or call 911). 12/19/21   Chandrasekhar, Terisa Starr, MD  omega-3 acid ethyl esters (LOVAZA) 1 g capsule Take 2 capsules (2 g total) by mouth 2 (two) times daily. 09/18/21   Chandrasekhar, Mahesh A, MD  TRADJENTA 5 MG TABS tablet Take 5 mg by mouth daily. 06/19/21   [provider]     Allergies:   No Known Allergies  Social History:   Social History   Socioeconomic History   Marital status: Married    Spouse name: Not on file   Number of children: Not on file   Years of education: Not on file   Highest education level: Not on file  Occupational History   Occupation: Disabled  Tobacco Use   Smoking status: Former    Packs/day: 2.00    Years: 10.00    Pack years: 20.00    Types: Cigars, Cigarettes    Start date: 06/16/1994    Quit date: 06/12/2004    Years since quitting: 17.6   Smokeless tobacco: Never  Vaping Use   Vaping Use: Never used  Substance and Sexual Activity   Alcohol  use: No    Alcohol/week: 0.0 standard drinks    Comment: Occasional   Drug use: Not Currently    Types: Marijuana    Comment: Last time-02/2013   Sexual activity: Yes    Partners: Female, Male  Other Topics Concern   Not on file  Social History Narrative   Married   Sedentary   Social Determinants of Health   Financial Resource Strain: Not on file  Food Insecurity: Not on file  Transportation Needs: Not on file  Physical Activity: Not on file  Stress: Not on file  Social Connections: Not on file  Intimate Partner Violence: Not on file    Family History:   The patient's family history includes Cancer in his father; Diabetes in his mother; Heart disease in his mother. There is no history of Colon cancer.    ROS:  Please see the history of present illness.  All other ROS reviewed and negative.     Physical Exam/Data:   Vitals:   01/19/22 0800 01/19/22 0830 01/19/22 0900 01/19/22 0936  BP: (!) 170/82 (!) 154/81 (!) 156/84 (!) 173/86  Pulse: 82 86 88 97  Resp: 18 16 (!) 30   Temp:      TempSrc:      SpO2: 96% 94% 98%   Weight:      Height:       No intake or output data in the 24 hours ending 01/19/22 0951 Last 3 Weights 01/19/2022 01/18/2022 12/19/2021  Weight (lbs) 229 lb 15 oz 230 lb 228 lb  Weight (kg) 104.3 kg 104.327 kg 103.42 kg     Body mass index is 32.99 kg/m.  General:  Well nourished, well developed. Mildly distressed, breathing is heavy  HEENT: normal Neck: no JVD, supple Vascular: Right radial pulse 2+, no left radial pulse (s/p left radial artery harvesting) Cardiac:  normal S1, S2; RRR; grade 2/6 systolic murmur. Some chest wall tenderness  Lungs:  clear to auscultation bilaterally, no wheezing, rhonchi or rales  Abd: soft, tender to deep palpation, no hepatomegaly  Ext: no edema Musculoskeletal:  No deformities, BUE and BLE strength normal and equal Skin: warm and dry. Scrotum is erythematous and mildly swollen, no exudate  Neuro:  CNs 2-12 intact,  no focal abnormalities noted Psych:  Normal affect    EKG:  The ECG that was done 3/10 was  personally reviewed and demonstrates sinus rhythm, mild ST depression in leads I, II, aVL, V2-V6 (likely global ischemia)   Relevant CV Studies:  Echocardiogram 01/12/22   1. Left ventricular ejection fraction, by estimation, is 55 to 60%. The  left ventricle has normal function. The left ventricle has no regional  wall motion abnormalities. There is mild left ventricular hypertrophy.  Left ventricular diastolic parameters  were normal.   2. Right ventricular systolic function is normal. The right ventricular  size is normal. Tricuspid regurgitation signal is inadequate for assessing  PA pressure.   3. The mitral valve is normal in structure. No evidence of mitral valve  regurgitation. No evidence of mitral stenosis.   4. The aortic valve is tricuspid. There is mild calcification of the  aortic valve. There is mild thickening of the aortic valve. Aortic valve  regurgitation is not visualized. Aortic valve sclerosis is present, with  no evidence of aortic valve stenosis.   Laboratory Data:  High Sensitivity Troponin:   Recent Labs  Lab 01/19/22 0715  TROPONINIHS 13      Chemistry Recent Labs  Lab 01/19/22 0715  NA 132*  K 4.3  CL 99  CO2 21*  GLUCOSE 361*  BUN 17  CREATININE 1.30*  CALCIUM 9.7  GFRNONAA >60  ANIONGAP 12    No results for input(s): PROT, ALBUMIN, AST, ALT, ALKPHOS, BILITOT in the last 168 hours. Lipids No results for input(s): CHOL, TRIG, HDL, LABVLDL, LDLCALC, CHOLHDL in the last 168 hours. Hematology Recent Labs  Lab 01/19/22 0715  WBC 8.3  RBC 4.20*  HGB 13.3  HCT 38.0*  MCV 90.5  MCH 31.7  MCHC 35.0  RDW 12.6  PLT 146*   Thyroid No results for input(s): TSH, FREET4 in the last 168 hours. BNPNo results for input(s): BNP, PROBNP in the last 168 hours.  DDimer No results for input(s): DDIMER in the last 168 hours.   Radiology/Studies:  DG Chest  2 View  Result Date: 01/19/2022 CLINICAL DATA:  61 year old male with history of chest pain. EXAM: CHEST - 2 VIEW COMPARISON:  Chest x-ray 02/17/2013. FINDINGS: Lung volumes are normal. No consolidative airspace disease. No pleural effusions. No pneumothorax. No pulmonary nodule or mass noted. Pulmonary vasculature and the cardiomediastinal silhouette are within normal limits. Atherosclerosis in the thoracic aorta. Status post median sternotomy for CABG. IMPRESSION: 1. No radiographic evidence of acute cardiopulmonary disease. 2. Aortic atherosclerosis. Electronically Signed   By: Vinnie Langton M.D.   On: 01/19/2022 07:47     Assessment and Plan:   Chest Pain, unstable angina  CAD s/p CABG in 2014, PCI to Lcx in 2016  - Patient reports that he has been having chest pain and SOB on exertion for over a year, episodes have become more frequent in the past month. Developed chest pain yesterday evening that was described as sharp, burning, pressure. Located substernally and radiated across chest and to the left shoulder. Not relieved with SL nitro or rest. Symptoms consistent with unstable, progressive angina   - EKG showed sinus rhythm, mild ST depression in leads I, II, aVL, V2-V6 (likely global ischemia)  - Echo completed on 01/12/22 showed showed EF 55-60%, mild LVH, no significant valvular abnormalities - hsTn 13>>27 - Continue asa  - Continue BP medications (amlodipine, lopressor, lisinopril)  - Continue lipitor, tricor, Lovaza   - Order lipid panel and A1c to assess cardiac risk factors (last drawn in 2017) - Recommend left heart catheterization. Patient has been hesitant to have  cardiac caths in the past due to anxiety over past CABG/caths, but he is agreeable today given the increased severity of his symptoms  - Patient had a CABG with left radial artery harvest, used his right groin in 2016. Patient has scrotal cellulitis currently, being treated with abx. Will discuss with MD ability to use  right groin  - Started IV nitro, patient NPO    HTN  - BP has been elevated in the ED  - Continue home amlodipine 10 mg daily  - Continue metoprolol tartrated 100 mg BID  - Continue Imdur 60 mg daily  - Continue lisinopril 40 mg daily  - On IV nitro   HLD  - Continue lipitor, tricor, lovaza   Otherwise per primary  - Scrotal cellulitis  - DM     Risk Assessment/Risk Scores:   TIMI Risk Score for Unstable Angina or Non-ST Elevation MI:   The patient's TIMI risk score is 4, which indicates a 20% risk of all cause mortality, new or recurrent myocardial infarction or need for urgent revascularization in the next 14 days.      For questions or updates, please contact [facility] Please consult [email or web address] for contact info under     Signed, Margie Billet, PA-C  01/19/2022 9:51 AM

## 2022-01-19 NOTE — ED Triage Notes (Signed)
Pt here via [facility] from home d/t chest pain. Pt experienced chest pressure X2 days. This morning pt had sudden onset of sharp/stabbing chest pain radiating to right side. Denies vomiting. Endorses nausea and SOB only. 10/10 pain. Diaphoretic with EMS. Pt took nitro X3 with little relief. 7/10.  ?EMS gave 324 ASA.  ?2X nitro  ?Cardiac history.  ? ?160/70 ?HR 80 ?RR 20 ?2 liters  ?

## 2022-01-19 NOTE — Interval H&P Note (Signed)
History and Physical Interval Note: ? ?01/19/2022 ?12:21 PM ? ?George [name]  has presented today for surgery, with the diagnosis of chest pain.  The various methods of treatment have been discussed with the patient and family. After consideration of risks, benefits and other options for treatment, the patient has consented to  Procedure(s): ?LEFT HEART CATH AND CORS/GRAFTS ANGIOGRAPHY (N/A) as a surgical intervention.  The patient's history has been reviewed, patient examined, no change in status, stable for surgery.  I have reviewed the patient's chart and labs.  Questions were answered to the patient's satisfaction.   ? ?Cath Lab Visit (complete for each Cath Lab visit) ? ?Clinical Evaluation Leading to the Procedure:  ? ?ACS: Yes.   ? ?Non-ACS:   ? ?Anginal Classification: CCS III ? ?Anti-ischemic medical therapy: Maximal Therapy (2 or more classes of medications) ? ?Non-Invasive Test Results: No non-invasive testing performed ? ?Prior CABG: Previous CABG ? ? ? ? ? ? ? ?George [name] ? ? ?

## 2022-01-19 NOTE — H&P (Signed)
Date: 01/19/2022               Patient Name:  George [name] MRN: [id number]  DOB: [birth year] Age / Sex: 61 y.o., male   PCP: Alanson Puls, [facility]: Internal Medicine Teaching Service         Attending Physician: Dr. Aldine Contes, MD    First Contact: Farrel Gordon, DO Pager: ED [phone]  Second Contact: Linwood Dibbles, MD Pager: PB [phone]       After Hours (After 5p/  First Contact Pager: [phone]  weekends / holidays): Second Contact Pager: [phone]   SUBJECTIVE   Chief Complaint: Chest pain  History of Present Illness: George [name] is a 61 year old male living with hypertension, hyperlipidemia, type 2 diabetes, 20-pack-year history of tobacco use disorder who quit smoking in 2005,  CAD with prior CABG (2014, LIMA to LAD, L rad to RPDA, SVG OM1, SVG OM2) and s/p PCI to the Lcx in 2016) who presents with chest pain.  Patient said he has had chest pain with exertion for a long time.  Over the last year he has chest pain every day and only able to do small chores around the house without becoming short of breath and experiencing chest pain.  He is having pain over the left side of his chest and sternum which radiates outward, rated 7 out of 10 at its worst, improved to 2 out of 10 with nitroglycerin.  Pain is described as sharp. Pain does not change with position.    Review of Systems  Constitutional:  Negative for chills and fever.  HENT:  Negative for sinus pain.   Eyes:  Negative for blurred vision and double vision.  Respiratory:  Positive for shortness of breath. Negative for cough, sputum production, wheezing and stridor.   Cardiovascular:  Positive for chest pain. Negative for palpitations, leg swelling and PND.  Gastrointestinal:  Positive for constipation. Negative for abdominal pain and heartburn.  Genitourinary:  Negative for dysuria and urgency.  Musculoskeletal:  Positive for back pain. Negative for falls.  Neurological:  Negative for dizziness  and headaches.  Endo/Heme/Allergies:  Negative for polydipsia. Does not bruise/bleed easily.  Psychiatric/Behavioral:  Negative for depression and substance abuse.     ED Course:  Meds:  Current Meds  Medication Sig   amLODipine (NORVASC) 10 MG tablet Take 10 mg by mouth daily.   aspirin EC 81 MG tablet Take 1 tablet (81 mg total) by mouth daily.   atorvastatin (LIPITOR) 80 MG tablet Take 40 mg by mouth daily.   doxycycline (VIBRAMYCIN) 100 MG capsule Take 1 capsule (100 mg total) by mouth 2 (two) times daily. (Patient taking differently: Take 100 mg by mouth 2 (two) times daily. Started on 01-18-22 7 DS)   fenofibrate (TRICOR) 48 MG tablet Take 48 mg by mouth daily.   glimepiride (AMARYL) 4 MG tablet Take 4 mg by mouth daily.   ibuprofen (ADVIL) 200 MG tablet Take 400 mg by mouth every 6 (six) hours as needed for mild pain or headache.   isosorbide mononitrate (IMDUR) 60 MG 24 hr tablet Take 1 tablet (60 mg total) by mouth daily.   lisinopril (ZESTRIL) 40 MG tablet Take 40 mg by mouth daily.   metFORMIN (GLUCOPHAGE) 1000 MG tablet Take 1 tablet (1,000 mg total) by mouth 2 (two) times daily with a meal.   metoprolol tartrate (LOPRESSOR) 100 MG tablet Take 100 mg by mouth 2 (two)  times daily.   nitroGLYCERIN (NITROSTAT) 0.4 MG SL tablet Place 1 tablet (0.4 mg total) under the tongue every 5 (five) minutes x 3 doses as needed for chest pain (if no relief after 2nd dose, proceed to ED or call 911).   omega-3 acid ethyl esters (LOVAZA) 1 g capsule Take 2 capsules (2 g total) by mouth 2 (two) times daily.    Past Medical History:  Diagnosis Date   Coronary artery disease    a. MI 2005 s/p prior RCA stenting;  b. s/p CABG x 4 (LIMA->LAD, Radial->RPDA, VG->OM1->OM2);  c. 03/2015 Cath/PCI: LAD 50p/m, LCX 99p (3.0x20 Promus Premier DES), OM2 100, RCA 136m Radial->RPDA nl, LIMA->LAD nl, VG->OM1->OM2 100, EF 55-65%.   Dyslipidemia    History of kidney stones    Hyperlipidemia    Hypertension     Hypertriglyceridemia    Marijuana abuse    Tobacco abuse    Type II diabetes mellitus ([facility]     Past Surgical History:  Procedure Laterality Date   ANGIOPLASTY     CARDIAC CATHETERIZATION N/A 04/07/2015   Procedure: Right/Left Heart Cath and Coronary/Graft Angiography;  Surgeon: MSherren Mocha MD;  [facility];  Service: Cardiovascular;  Laterality: N/A;   CARDIAC CATHETERIZATION N/A 04/07/2015   Procedure: Coronary Stent Intervention;  Surgeon: MSherren Mocha MD;  [facility];  Service: Cardiovascular;  Laterality: N/A;   COLONOSCOPY N/A 06/29/2013   Procedure: COLONOSCOPY;  Surgeon: SDanie Binder MD;  [facility];  Service: Endoscopy;  Laterality: N/A;  10:45 AM   CORONARY ANGIOPLASTY     CORONARY ARTERY BYPASS GRAFT N/A 12/31/2012   Procedure: CORONARY ARTERY BYPASS GRAFTING (CABG);  Surgeon: SMelrose Nakayama MD;  [place]  Service: Open Heart Surgery;  Laterality: N/A;  times four using left internal mammary artery, left radial artery, endoscopically harvested right saphenous vein   CORONARY STENT PLACEMENT  2005   LAD50/70, CFX OK, PL1 90, PL2 40, RCA 95->0 with 2.5 x 60 mm Taxus stent, EF 60   LEFT HEART CATHETERIZATION WITH CORONARY ANGIOGRAM N/A 12/30/2012   Procedure: LEFT HEART CATHETERIZATION WITH CORONARY ANGIOGRAM;  Surgeon: PJosue Hector MD;  [facility];  Service: Cardiovascular;  Laterality: N/A;   LUMBAR SPINE SURGERY  2005   PERCUTANEOUS CORONARY STENT INTERVENTION (PCI-S)  04/07/2015   native left circumfles with DES   RADIAL ARTERY HARVEST Left 12/31/2012   Procedure: RADIAL ARTERY HARVEST;  Surgeon: SMelrose Nakayama MD;  [place] [facility]  Service: Open Heart Surgery;  Laterality: Left;    Social:  Lives With: Wife Occupation: Disabled  Level of Function: Performs own ADLs and IADLs, uses a cane for ambulation due to back pain [facility]: No alcohol use, no drug use,   20-pack-year history of tabacco use quit 2005  Family History: Family History  Problem Relation Age of Onset   Diabetes Mother    Heart disease Mother    Cancer Father    Prostate cancer Father    Colon cancer Neg Hx    Father died in D2024-12-18from prostate cancer at age 60  Mother died at age 5726with multiple comorbidities.  Allergies: Allergies as of 01/19/2022   (No Known Allergies)    Review of Systems: A complete ROS was negative except as per HPI.   OBJECTIVE:   Physical Exam: Blood pressure (!) 156/84, pulse 88, temperature 99.6 F (37.6 C), temperature source Oral, resp. rate ([name]  30, height '5\' 10"'$  (1.778 m), weight 104.3 kg, SpO2 98 %.  Constitutional: Well-nourished well-developed, no acute distress HENT:  mucous membranes moist, no lymphadenopathy Eyes: conjunctiva non-erythematous, anicteric sclera Cardiovascular: regular rate and GOTLXB,2/6 systolic decrescendo murmur , no chest wall tenderness, no JVD Pulmonary/Chest: normal work of breathing on room air, lungs clear to auscultation bilaterally Abdominal: soft, non-tender, BS+ MSK: normal bulk and tone Neurological: alert & oriented x 4, no focal deficits  Skin: warm and dry GU: 3 Firm and painful nodules in the left groin at the base of the scrotum, nonerythematous and no fluctuance Psych: normal mood, normal behavior  Labs: CBC    Component Value Date/Time   WBC 8.3 01/19/2022 0715   RBC 4.20 (L) 01/19/2022 0715   HGB 13.3 01/19/2022 0715   HCT 38.0 (L) 01/19/2022 0715   PLT 146 (L) 01/19/2022 0715   MCV 90.5 01/19/2022 0715   MCH 31.7 01/19/2022 0715   MCHC 35.0 01/19/2022 0715   RDW 12.6 01/19/2022 0715   LYMPHSABS 3.8 07/31/2016 0823   MONOABS 0.7 07/31/2016 0823   EOSABS 0.6 07/31/2016 0823   BASOSABS 0.1 07/31/2016 0823     CMP     Component Value Date/Time   NA 132 (L) 01/19/2022 0715   K 4.3 01/19/2022 0715   CL 99 01/19/2022 0715   CO2 21 (L) 01/19/2022 0715   GLUCOSE 361 (H)  01/19/2022 0715   BUN 17 01/19/2022 0715   CREATININE 1.30 (H) 01/19/2022 0715   CREATININE 1.24 06/17/2014 1016   CALCIUM 9.7 01/19/2022 0715   PROT 7.0 06/17/2014 1016   ALBUMIN 4.5 06/17/2014 1016   AST 18 06/17/2014 1016   ALT 37 06/17/2014 1016   ALKPHOS 51 06/17/2014 1016   BILITOT 0.6 06/17/2014 1016   GFRNONAA >60 01/19/2022 0715   GFRAA >60 07/31/2016 0823    Imaging: DG Chest 2 View  Result Date: 01/19/2022 CLINICAL DATA:  61 year old male with history of chest pain. EXAM: CHEST - 2 VIEW COMPARISON:  Chest x-ray 02/17/2013. FINDINGS: Lung volumes are normal. No consolidative airspace disease. No pleural effusions. No pneumothorax. No pulmonary nodule or mass noted. Pulmonary vasculature and the cardiomediastinal silhouette are within normal limits. Atherosclerosis in the thoracic aorta. Status post median sternotomy for CABG. IMPRESSION: 1. No radiographic evidence of acute cardiopulmonary disease. 2. Aortic atherosclerosis. Electronically Signed   By: Vinnie Langton M.D.   On: 01/19/2022 07:47    EKG: personally reviewed my interpretation is sinus rhythm with wide spread ST depressions in I,II, V2-V5   ASSESSMENT & PLAN:    Assessment & Plan by Problem: Active Problems:   Chest pain   George [name] is a 61 y.o. with pertinent PMH of hypertension, hyperlipidemia, type 2 diabetes, coronary artery disease with history with prior CABG (2014, LIMA to LAD, L rad to RPDA, SVG OM1, SVG OM2) and s/p PCI to the Lcx in 2016)disease who presented with chest pain and admitted for chest pain observation.  #Unstable angina #CAD status post CABG in 2014, PCI to left circumflex in 2016 -Patient in sinus rhythm with wide spread ST depressions inferiorly and anteriorly and initial troponin 13>27.  -Patient has longstanding chest pain and has been offered cath but resistant in the past due to anxiety related with previous CABG.  Cardiology has seen patient and has plan for left heart cath  and patient has been started on a nitro drip. -Patient took aspirin this morning we will continue 81 mg aspirin -Continue atorvastatin 80  mg -Lipid panel and A1c - Continue Nitro drip  #HTN -Patient has not taken home blood pressure medications.  Home regimen includes amlodipine 10 mg, lisinopril 40 mg, Lopressor 100 twice daily , Imdur 60 mg -Continue home amlodipine 10 mg, metoprolol tartrate 100 mg twice daily, Imdur 60 mg daily, and lisinopril  #Type 2 diabetes with hyperglycemia Hold home medications metformin and glimepiride -SSI-M - QHS correction -Globin A1c  #Mild elevation in creatinine? -Monitor with BMP, baseline around 1 but not checked in our system for 5 years.  Given other comorbidities I expect this is patient's baseline, but will need to obtain records from his PCP.  Left groin nodules Patient was started yesterday on doxycycline after being evaluated in the ED.  Areas are painful and expect underlying cyst.  Reports history of cyst which have resolved with antibiotics. - Continue to monitor for sign of infection - Continue Doxycycline   Diet: NPO VTE: Enoxaparin IVF: None,None Code: Full  Prior to Admission Living Arrangement: Home, living his wife  Anticipated Discharge Location: Home Barriers to Discharge: Workup as above  Dispo: Admit patient to Observation with expected length of stay less than 2 midnights.  Signed: Drema Pry, MD Internal Medicine Resident PGY-3 Pager: [phone]  01/19/2022, 10:41 AM

## 2022-01-19 NOTE — ED Provider Notes (Signed)
[facility] EMERGENCY DEPARTMENT Provider Note   CSN: [id number] Arrival date & time: 01/19/22  1517     History  Chief Complaint  Patient presents with   Chest Pain    George [name] is a 61 y.o. male.  The history is provided by the patient.  Chest Pain Pain location:  Substernal area Pain quality: pressure   Pain radiates to:  Does not radiate Pain severity:  Moderate Onset quality:  Gradual Duration:  1 month Timing:  Intermittent Progression:  Waxing and waning Chronicity:  Recurrent Context comment:  Exertion Relieved by:  Nothing Worsened by:  Nothing Associated symptoms: no abdominal pain, no altered mental status, no anorexia, no anxiety, no back pain, no claudication, no cough, no diaphoresis, no dizziness, no dysphagia, no fatigue, no fever, no headache, no heartburn, no lower extremity edema, no nausea, no near-syncope, no numbness, no orthopnea, no palpitations, no PND, no shortness of breath, no syncope, no vomiting and no weakness   Risk factors: coronary artery disease, diabetes mellitus, high cholesterol and hypertension       Home Medications Prior to Admission medications   Medication Sig Start Date End Date Taking? Authorizing Provider  amLODipine (NORVASC) 10 MG tablet Take 10 mg by mouth daily. 10/18/21   [provider]  aspirin EC 81 MG tablet Take 1 tablet (81 mg total) by mouth daily. 05/26/14   Herminio Commons, MD  atorvastatin (LIPITOR) 80 MG tablet Take 40 mg by mouth daily.    [provider]  doxycycline (VIBRAMYCIN) 100 MG capsule Take 1 capsule (100 mg total) by mouth 2 (two) times daily. 01/18/22   Fredia Sorrow, MD  fenofibrate (TRICOR) 48 MG tablet Take 48 mg by mouth daily. 06/21/21   [provider]  glimepiride (AMARYL) 2 MG tablet Take 2 mg by mouth daily. 06/21/21   [provider]  isosorbide mononitrate (IMDUR) 60 MG 24 hr tablet Take 1 tablet (60 mg total) by mouth daily.  12/19/21   Chandrasekhar, Mahesh A, MD  lisinopril (ZESTRIL) 40 MG tablet Take 40 mg by mouth daily. 12/14/21   [provider]  metFORMIN (GLUCOPHAGE) 1000 MG tablet Take 1 tablet (1,000 mg total) by mouth 2 (two) times daily with a meal. 04/08/15   Theora Gianotti, NP  metoprolol tartrate (LOPRESSOR) 100 MG tablet Take 100 mg by mouth 2 (two) times daily. 11/21/21   [provider]  nitroGLYCERIN (NITROSTAT) 0.4 MG SL tablet Place 1 tablet (0.4 mg total) under the tongue every 5 (five) minutes x 3 doses as needed for chest pain (if no relief after 2nd dose, proceed to ED or call 911). 12/19/21   Chandrasekhar, Terisa Starr, MD  omega-3 acid ethyl esters (LOVAZA) 1 g capsule Take 2 capsules (2 g total) by mouth 2 (two) times daily. 09/18/21   Chandrasekhar, Mahesh A, MD  TRADJENTA 5 MG TABS tablet Take 5 mg by mouth daily. 06/19/21   [provider]      Allergies    Patient has no known allergies.    Review of Systems   Review of Systems  Constitutional:  Negative for diaphoresis, fatigue and fever.  HENT:  Negative for trouble swallowing.   Respiratory:  Negative for cough and shortness of breath.   Cardiovascular:  Positive for chest pain. Negative for palpitations, orthopnea, claudication, syncope, PND and near-syncope.  Gastrointestinal:  Negative for abdominal pain, anorexia, heartburn, nausea and vomiting.  Musculoskeletal:  Negative for back pain.  Neurological:  Negative for dizziness, weakness, numbness and headaches.   Physical Exam Updated Vital Signs  ED Triage Vitals  Enc Vitals Group     BP 01/19/22 0717 (!) 169/78     Pulse Rate 01/19/22 0717 83     Resp 01/19/22 0717 20     Temp 01/19/22 0717 99.6 F (37.6 C)     Temp Source 01/19/22 0717 Oral     SpO2 01/19/22 0717 97 %     Weight 01/19/22 0726 229 lb 15 oz (104.3 kg)     Height 01/19/22 0726 '5\' 10"'$  (1.778 m)     Head Circumference --      Peak Flow --      Pain Score --      Pain Loc --       Pain Edu? --      Excl. in [place]? --     Physical Exam Vitals and nursing note reviewed.  Constitutional:      General: He is not in acute distress.    Appearance: He is well-developed. He is not ill-appearing.  HENT:     Head: Normocephalic and atraumatic.  Eyes:     Extraocular Movements: Extraocular movements intact.     Conjunctiva/sclera: Conjunctivae normal.     Pupils: Pupils are equal, round, and reactive to light.  Cardiovascular:     Rate and Rhythm: Normal rate and regular rhythm.     Heart sounds: Normal heart sounds. No murmur heard. Pulmonary:     Effort: Pulmonary effort is normal. No respiratory distress.     Breath sounds: Normal breath sounds. No decreased breath sounds, wheezing or rhonchi.  Abdominal:     Palpations: Abdomen is soft.     Tenderness: There is no abdominal tenderness.  Musculoskeletal:        General: No swelling. Normal range of motion.     Cervical back: Normal range of motion and neck supple.     Right lower leg: No edema.     Left lower leg: No edema.  Skin:    General: Skin is warm and dry.     Capillary Refill: Capillary refill takes less than 2 seconds.     Findings: Erythema (to scrotum but no major swelling or crepitus) present.  Neurological:     Mental Status: He is alert.  Psychiatric:        Mood and Affect: Mood normal.    ED Results / Procedures / Treatments   Labs (all labs ordered are listed, but only abnormal results are displayed) Labs Reviewed  BASIC METABOLIC PANEL - Abnormal; Notable for the following components:      Result Value   Sodium 132 (*)    CO2 21 (*)    Glucose, Bld 361 (*)    Creatinine, Ser 1.30 (*)    All other components within normal limits  CBC - Abnormal; Notable for the following components:   RBC 4.20 (*)    HCT 38.0 (*)    Platelets 146 (*)    All other components within normal limits  TROPONIN I (HIGH SENSITIVITY)    EKG EKG Interpretation  Date/Time:  Friday January 19 2022  07:21:28 EST Ventricular Rate:  84 PR Interval:  160 QRS Duration: 105 QT Interval:  406 QTC Calculation: 480 R Axis:   113 Text Interpretation: Sinus rhythm Right axis deviation Repol abnrm suggests ischemia, anterolateral Confirmed by Lennice Sites ([id number]) on 01/19/2022 7:55:09 AM  Radiology DG Chest 2 View  Result  Date: 01/19/2022 CLINICAL DATA:  61 year old male with history of chest pain. EXAM: CHEST - 2 VIEW COMPARISON:  Chest x-ray 02/17/2013. FINDINGS: Lung volumes are normal. No consolidative airspace disease. No pleural effusions. No pneumothorax. No pulmonary nodule or mass noted. Pulmonary vasculature and the cardiomediastinal silhouette are within normal limits. Atherosclerosis in the thoracic aorta. Status post median sternotomy for CABG. IMPRESSION: 1. No radiographic evidence of acute cardiopulmonary disease. 2. Aortic atherosclerosis. Electronically Signed   By: Vinnie Langton M.D.   On: 01/19/2022 07:47    Procedures Procedures    Medications Ordered in ED Medications  nitroGLYCERIN (NITROSTAT) SL tablet 0.4 mg (has no administration in time range)  morphine (PF) 4 MG/ML injection 4 mg (4 mg Intravenous Given 01/19/22 0732)    ED Course/ Medical Decision Making/ A&P                           Medical Decision Making Amount and/or Complexity of Data Reviewed Labs: ordered. Radiology: ordered.  Risk Prescription drug management. Decision regarding hospitalization.   ANA LIAW is here with chest pain.  History of CABG, stents.  History of diabetes and high cholesterol.  He has been having worsening chest pain on and off for the last week or so.  Has been chest pain-free for a long time for the last several weeks and not using his nitroglycerin more.  Last episode of chest pain was around 4 AM this morning about 3 hours ago.  Took his nitroglycerin with some relief.  He was seen yesterday in the ED for some scrotal redness and was started on doxycycline.  He denies  any infectious symptoms including cough and sputum production.  No shortness of breath.  Clear breath sounds on exam.  He has some redness to his scrotum but there is no obvious crepitus or abscess.  Agree that he likely has scrotal cellulitis.  Patient with differential mostly concerning for acute coronary syndrome, less likely pneumonia, pulmonary embolism.    EKG per my review and interpretation shows sinus rhythm but diffuse ST depression inferiorly and anteriorly which appear to be new.  Talked with Dr. Oval Linsey on the phone with cardiology and does not think that this is a STEMI equivalent and overall we both agree that this is likely global ischemia.  To further work patient up CBC, BMP, troponin, chest x-ray has been ordered.  Per my review and interpretation of chest x-ray there is no pneumonia or pneumothorax.  Troponin is 13 per my review of his labs.  Further interpretation of his labs by myself show no significant anemia, electrolyte abnormality except for blood sugar of 361.  Cardiology team to evaluate the patient patient to be admitted to medicine for further care.  Pain has improved with morphine and nitroglycerin.        Final Clinical Impression(s) / ED Diagnoses Final diagnoses:  Unstable angina ([place])  Cellulitis, unspecified cellulitis site    Rx / DC Orders ED Discharge Orders     None         Lennice Sites, DO 01/19/22 [id number]

## 2022-01-20 DIAGNOSIS — I10 Essential (primary) hypertension: Secondary | ICD-10-CM | POA: Diagnosis not present

## 2022-01-20 DIAGNOSIS — I2510 Atherosclerotic heart disease of native coronary artery without angina pectoris: Secondary | ICD-10-CM

## 2022-01-20 LAB — BASIC METABOLIC PANEL
Anion gap: 12 (ref 5–15)
BUN: 18 mg/dL (ref 6–20)
CO2: 23 mmol/L (ref 22–32)
Calcium: 9.4 mg/dL (ref 8.9–10.3)
Chloride: 99 mmol/L (ref 98–111)
Creatinine, Ser: 1.4 mg/dL — ABNORMAL HIGH (ref 0.61–1.24)
GFR, Estimated: 58 mL/min — ABNORMAL LOW (ref >60)
Glucose, Bld: 239 mg/dL — ABNORMAL HIGH (ref 70–99)
Potassium: 4.4 mmol/L (ref 3.5–5.1)
Sodium: 134 mmol/L — ABNORMAL LOW (ref 135–145)

## 2022-01-20 LAB — CBC
HCT: 38.7 % — ABNORMAL LOW (ref 39.0–52.0)
Hemoglobin: 13.5 g/dL (ref 13.0–17.0)
MCH: 31.9 pg (ref 26.0–34.0)
MCHC: 34.9 g/dL (ref 30.0–36.0)
MCV: 91.5 fL (ref 80.0–100.0)
Platelets: 164 10*3/uL (ref 150–400)
RBC: 4.23 MIL/uL (ref 4.22–5.81)
RDW: 12.6 % (ref 11.5–15.5)
WBC: 9.5 10*3/uL (ref 4.0–10.5)
nRBC: 0 % (ref 0.0–0.2)

## 2022-01-20 LAB — LIPID PANEL
Cholesterol: 137 mg/dL (ref 0–200)
HDL: 31 mg/dL — ABNORMAL LOW (ref >40)
LDL Cholesterol: 71 mg/dL (ref 0–99)
Total CHOL/HDL Ratio: 4.4 RATIO
Triglycerides: 176 mg/dL — ABNORMAL HIGH (ref <150)
VLDL: 35 mg/dL (ref 0–40)

## 2022-01-20 LAB — HEMOGLOBIN A1C
Hgb A1c MFr Bld: 9.5 % — ABNORMAL HIGH (ref 4.8–5.6)
Mean Plasma Glucose: 226 mg/dL

## 2022-01-20 LAB — GLUCOSE, CAPILLARY
Glucose-Capillary: 228 mg/dL — ABNORMAL HIGH (ref 70–99)
Glucose-Capillary: 269 mg/dL — ABNORMAL HIGH (ref 70–99)

## 2022-01-20 MED ORDER — ISOSORBIDE MONONITRATE ER 60 MG PO TB24: 90.0000 mg | ORAL_TABLET | Freq: Every day | ORAL | Status: DC

## 2022-01-20 MED ORDER — RANOLAZINE ER 500 MG PO TB12: 500.0000 mg | ORAL_TABLET | Freq: Two times a day (BID) | ORAL | 2 refills | Status: DC

## 2022-01-20 MED ORDER — ISOSORBIDE MONONITRATE ER 30 MG PO TB24: 90.0000 mg | ORAL_TABLET | Freq: Every day | ORAL | 2 refills | Status: DC

## 2022-01-20 NOTE — Progress Notes (Signed)
? ?Progress Note ? ?Patient Name: George [name] ?Date of Encounter: 01/20/2022 ? ?[facility] Cardiologist: Werner Lean, MD  ? ?Subjective  ? ? ? ?Inpatient Medications  ?  ?Scheduled Meds: ? amLODipine  10 mg Oral Daily  ? aspirin EC  81 mg Oral Daily  ? atorvastatin  40 mg Oral Daily  ? doxycycline  100 mg Oral BID  ? enoxaparin (LOVENOX) injection  40 mg Subcutaneous Q24H  ? fenofibrate  54 mg Oral Daily  ? furosemide  40 mg Intravenous Daily  ? insulin aspart  0-15 Units Subcutaneous TID WC  ? insulin aspart  0-5 Units Subcutaneous QHS  ? insulin aspart  2 Units Subcutaneous TID WC  ? insulin glargine-yfgn  10 Units Subcutaneous QHS  ? isosorbide mononitrate  60 mg Oral Daily  ? metoprolol tartrate  100 mg Oral BID  ? omega-3 acid ethyl esters  2 g Oral BID  ? ranolazine  500 mg Oral BID  ? sodium chloride flush  3 mL Intravenous Q12H  ? sodium chloride flush  3 mL Intravenous Q12H  ? ?Continuous Infusions: ? sodium chloride    ? ?PRN Meds: ?sodium chloride, acetaminophen, magnesium hydroxide, ondansetron (ZOFRAN) IV, sodium chloride flush  ? ?Vital Signs  ?  ?Vitals:  ? 01/19/22 1609 01/19/22 2029 01/20/22 0017 01/20/22 0418  ?BP: 139/74 139/73 (!) 150/72 (!) 141/78  ?Pulse: 79 85 77 68  ?Resp: '20 17 14 15  '$ ?Temp: 99.4 ?F (37.4 ?C) 99.3 ?F (37.4 ?C) 99.5 ?F (37.5 ?C) 98.4 ?F (36.9 ?C)  ?TempSrc: Oral Oral Oral Oral  ?SpO2: 94% 97% 98% 100%  ?Weight:      ?Height:      ? ? ?Intake/Output Summary (Last 24 hours) at 01/20/2022 0755 ?Last data filed at 01/19/2022 2056 ?Gross per 24 hour  ?Intake 344.23 ml  ?Output --  ?Net 344.23 ml  ? ?Last 3 Weights 01/19/2022 01/18/2022 12/19/2021  ?Weight (lbs) 229 lb 15 oz 230 lb 228 lb  ?Weight (kg) 104.3 kg 104.327 kg 103.42 kg  ?   ? ?Telemetry  ?  ?SR   - Personally Reviewed ? ?ECG  ?  ? - Personally Reviewed ? ?Physical Exam  ? ?GEN: No acute distress.   ?Neck: No JVD ?Cardiac: RRR, no murmurs, rubs, or gallops.  ?Respiratory: Clear to auscultation bilaterally. ?GI:  Soft, nontender, non-distended  ?MS: No edema; No deformity.  R groin  Soft   No bruits   ?Neuro:  Nonfocal  ?Psych: Normal affect  ? ?Labs  ?  ?High Sensitivity Troponin:   ?Recent Labs  ?Lab 01/19/22 ?0715 01/19/22 ?0920  ?TROPONINIHS 13 27*  ?   ?Chemistry ?Recent Labs  ?Lab 01/19/22 ?0715  ?NA 132*  ?K 4.3  ?CL 99  ?CO2 21*  ?GLUCOSE 361*  ?BUN 17  ?CREATININE 1.30*  ?CALCIUM 9.7  ?GFRNONAA >60  ?ANIONGAP 12  ?  ?Lipids  ?Recent Labs  ?Lab 01/20/22 ?0147  ?CHOL 137  ?TRIG 176*  ?HDL 31*  ?[place] 71  ?CHOLHDL 4.4  ?  ?Hematology ?Recent Labs  ?Lab 01/19/22 ?0715  ?WBC 8.3  ?RBC 4.20*  ?HGB 13.3  ?HCT 38.0*  ?MCV 90.5  ?MCH 31.7  ?MCHC 35.0  ?RDW 12.6  ?PLT 146*  ? ?Thyroid No results for input(s): TSH, FREET4 in the last 168 hours.  ?BNPNo results for input(s): BNP, PROBNP in the last 168 hours.  ?DDimer No results for input(s): DDIMER in the last 168 hours.  ? ?Radiology  ?  ?  DG Chest 2 View ? ?Result Date: 01/19/2022 ?CLINICAL DATA:  61 year old male with history of chest pain. EXAM: CHEST - 2 VIEW COMPARISON:  Chest x-ray 02/17/2013. FINDINGS: Lung volumes are normal. No consolidative airspace disease. No pleural effusions. No pneumothorax. No pulmonary nodule or mass noted. Pulmonary vasculature and the cardiomediastinal silhouette are within normal limits. Atherosclerosis in the thoracic aorta. Status post median sternotomy for CABG. IMPRESSION: 1. No radiographic evidence of acute cardiopulmonary disease. 2. Aortic atherosclerosis. Electronically Signed   By: Vinnie Langton M.D.   On: 01/19/2022 07:47  ? ?CARDIAC CATHETERIZATION ? ?Result Date: 01/19/2022 ?  Mid LAD lesion is 50% stenosed.   Mid RCA lesion is 100% stenosed.   Prox Graft lesion before 1st Mrg  is 100% stenosed.   Ost Cx to Mid Cx lesion is 100% stenosed.   Ost LM to Mid LM lesion is 60% stenosed.   Dist LAD-1 lesion is 50% stenosed.   Dist LAD-2 lesion is 90% stenosed.   Prox LAD lesion is 80% stenosed.   Left radial artery graft was  visualized by angiography and is normal in caliber.   SVG graft was not injected.   LIMA graft was visualized by angiography and is normal in caliber.   The graft exhibits no disease.   The graft exhibits no disease. Severe three vessel CAD s/p 4V CABG with 2/4 patent bypass grafts Moderate distal left main stenosis Severe mid LAD stenosis. The mid and distal LAD fills from the patent LIMA graft to the mid LAD. The mid and distal LAD is diffusely diseased. The Circumflex is now occluded at the ostium. The sequential vein graft to the first and second obtuse marginal branches is known to be occluded. Several obtuse marginal branches are seen to fill from left to left collaterals. The flush ostial occlusion of the Circumflex is chronic and not favorable for PCI. The RCA is a large dominant vessel with known 100% mid occlusion. The distal branches fill from the patent free radial artery graft to the PDA. Elevated LV filling pressure. Recommendation: No good options for PCI. His Circumflex is now occluded. This appears to be chronic. His troponin level is not elevated and he reports chest pain for over a year. Will maximize anti-anginal therapy. Will resume Imdur and will add Ranexa. IV lasix today post cath with elevated LVEDP. Monitor on telemetry overnight.   ? ?Cardiac Studies  ? ?  ?  Mid LAD lesion is 50% stenosed. ?  Mid RCA lesion is 100% stenosed. ?  Prox Graft lesion before 1st Mrg  is 100% stenosed. ?  Ost Cx to Mid Cx lesion is 100% stenosed. ?  Ost LM to Mid LM lesion is 60% stenosed. ?  Dist LAD-1 lesion is 50% stenosed. ?  Dist LAD-2 lesion is 90% stenosed. ?  Prox LAD lesion is 80% stenosed. ?  Left radial artery graft was visualized by angiography and is normal in caliber. ?  SVG graft was not injected. ?  LIMA graft was visualized by angiography and is normal in caliber. ?  The graft exhibits no disease. ?  The graft exhibits no disease. ?  ?Severe three vessel CAD s/p 4V CABG with 2/4 patent bypass  grafts ?Moderate distal left main stenosis ?Severe mid LAD stenosis. The mid and distal LAD fills from the patent LIMA graft to the mid LAD. The mid and distal LAD is diffusely diseased.  ?The Circumflex is now occluded at the ostium. The sequential vein graft to  the first and second obtuse marginal branches is known to be occluded. Several obtuse marginal branches are seen to fill from left to left collaterals. The flush ostial occlusion of the Circumflex is chronic and not favorable for PCI.  ?The RCA is a large dominant vessel with known 100% mid occlusion. The distal branches fill from the patent free radial artery graft to the PDA.  ?Elevated LV filling pressure.  ?  ?Recommendation: No good options for PCI. His Circumflex is now occluded. This appears to be chronic. His troponin level is not elevated and he reports chest pain for over a year. Will maximize anti-anginal therapy. Will resume Imdur and will add Ranexa. IV lasix today post cath with elevated LVEDP. Monitor on telemetry overnight.  ? ?Patient Profile  ?   ?61 y.o. male hx of CAD (s/P CABG and PCI), HTN, HL,   Presents with [place]   ? ?Assessment & Plan  ?  ?1  CAD  Long cardiac hx with severe CAD   Presented with [place]   Cath as noted above   plan for maxiimizing  medical Rx   Ranexa added     Will arrange for outpt follow up   WIll need EKG ?Increase imdur to 90     ? ?2  HTN   BP has been labile   Fill follow as outpt   Need to keep perfusion pressure  Jst got lasix   Follow as outpt    Will increase imdur to 90    ? ?3  HL  Continue lipitor, tricor, lovaze   ? ?4  DM   Per PCP ? ? ? ?For questions or updates, please contact [place] ?Please consult [email or web address] for contact info under  ? ?  ?   ?Signed, ?Dorris Carnes, MD  ?01/20/2022, 7:55 AM   ? ?

## 2022-01-20 NOTE — Discharge Summary (Cosign Needed)
Name: George [name] MRN: [id number] DOB: 09/16/1961 61 y.o. PCP: [facility]  Date of Admission: 01/19/2022  7:09 AM Date of Discharge:   01/20/2022 Attending Physician: Dr. Philipp Ovens  Discharge Diagnosis: Active Problems:   Chest pain    Discharge Medications: Allergies as of 01/20/2022   No Known Allergies      Medication List     TAKE these medications    amLODipine 10 MG tablet Commonly known as: NORVASC Take 10 mg by mouth daily.   aspirin EC 81 MG tablet Take 1 tablet (81 mg total) by mouth daily.   atorvastatin 80 MG tablet Commonly known as: LIPITOR Take 40 mg by mouth daily.   doxycycline 100 MG capsule Commonly known as: VIBRAMYCIN Take 1 capsule (100 mg total) by mouth 2 (two) times daily. What changed: additional instructions   fenofibrate 48 MG tablet Commonly known as: TRICOR Take 48 mg by mouth daily.   glimepiride 4 MG tablet Commonly known as: AMARYL Take 4 mg by mouth daily.   ibuprofen 200 MG tablet Commonly known as: ADVIL Take 400 mg by mouth every 6 (six) hours as needed for mild pain or headache.   isosorbide mononitrate 30 MG 24 hr tablet Commonly known as: IMDUR Take 3 tablets (90 mg total) by mouth daily. Start taking on: January 21, 2022 What changed:  medication strength how much to take   lisinopril 40 MG tablet Commonly known as: ZESTRIL Take 40 mg by mouth daily.   metFORMIN 1000 MG tablet Commonly known as: GLUCOPHAGE Take 1 tablet (1,000 mg total) by mouth 2 (two) times daily with a meal.   metoprolol tartrate 100 MG tablet Commonly known as: LOPRESSOR Take 100 mg by mouth 2 (two) times daily.   nitroGLYCERIN 0.4 MG SL tablet Commonly known as: NITROSTAT Place 1 tablet (0.4 mg total) under the tongue every 5 (five) minutes x 3 doses as needed for chest pain (if no relief after 2nd dose, proceed to ED or call 911).   omega-3 acid ethyl esters 1 g capsule Commonly known as: LOVAZA Take 2 capsules (2 g  total) by mouth 2 (two) times daily.   ranolazine 500 MG 12 hr tablet Commonly known as: RANEXA Take 1 tablet (500 mg total) by mouth 2 (two) times daily.        Disposition and follow-up:   George [name] was discharged from [facility] in Stable condition.  At the hospital follow up visit please address:  1.  Follow-up:  a.  Stable angina (CAD s/p CABG in 2014, PCI to Lcx in 2016): Left heart cath showed completely occluded left circumflex but no good options for PCI. Chest pain resolved on day of discharge.  Patient is scheduled to follow-up with cardiology.     b.  Hypertension: BP elevated on admission.  Antihypertensive medications were restarted during hospitalization.  Imdur was increased from 60 mg to 90 mg daily.  Patient will need BP check at office visit.   c.  Type 2 diabetes: A1c 9.5% on admission.  Patient will need better control of diabetes.  Consider starting patient on long-acting insulin or antidiabetic diabetic medication such as SGLT2i   d.  CKD vs AKI: Unknown baseline creatinine. Creatinine 1.4 at discharge. Patient will need repeat BMP to ensure improvement or further work-up for CKD.  2.  Labs / imaging needed at time of follow-up: BMP  3.  Pending labs/ test needing follow-up: None  Follow-up Appointments:  Follow-up Information     [facility] Follow up on 02/09/2022.   Specialty: Cardiology Why: Cardiology Hospital Follow-up with Dr. Gasper Sells on 02/09/2022 at 3:40 PM Contact information: [place] Follow up.   Why: Follow-up with your PCP for hospital follow-up appt in 1 week Contact information: [place] [address] [phone]         George Lean, George [name] .   Specialty: Cardiology Contact information: [place] [address] [place] [facility] Course by problem list: George [name] is a 61 y.o. male with hx of with pertinent PMH of hypertension, hyperlipidemia, type 2 diabetes, coronary artery disease with history with prior CABG (2014, LIMA to LAD, L rad to RPDA, SVG OM1, SVG OM2) and s/p PCI to the Lcx in 2016) disease who presented with chest pain and admitted for unstable angina.   1) Unstable angina     CAD status post CABG in 2014, PCI to left circumflex in 2016 Patient presented to the ED with persistent chest pain in the setting of a history of severe CAD status post 4v-CABG and EKG changes consistent with ischemia and mild troponin elevation of 27 consistent with unstable angina/NSTEMI.  Patient was started on a nitro drip in the ER and discontinued after chest pain resolved. Cardiology was consulted for evaluation. Patient received a left heart cath catheterization which showed total occlusion of left circumflex with collateral circulation and patent grafts to the mid LAD and PDA.  There was no good options for PCI. Cardiology plan is to maximize medical therapy.  Patient received IV Lasix post-cath due to elevated LVEDP.  Ranexa was added to patient's regimen and his Imdur was increased from 60 to 90 mg daily.  Chest pain had resolved by day of hospitalization.  Patient scheduled to follow-up with his primary cardiologist in the outpatient.   2) HTN Patient found to be hypertensive with systolic blood pressure in the 150s on admission due to patient not taking his blood pressure medications.  His home regimen were resumed except for lisinopril due to mild AKI or undiagnosed CKD.  Systolic blood pressure remained in the 130s to 150s throughout hospitalization.  Patient's Imdur was increased from 60 mg to 90 mg daily.  His antihypertensive medications were resumed at discharge.  Patient instructed to follow-up with PCP for BP check and adjustment to his medications.   3) Type 2 diabetes with hyperglycemia Patient found to  have A1c of 9.5%. His home medications includes metformin and glimepiride.  His blood sugar remain consistently elevated in the low 300s. Patient was put on a Semglee 10 units at bedtime and sliding scale insulin.  Blood sugars improved to the 200s. Patient plans to follow-up with his PCP.  He will need a better regimen to help control his blood sugar.  Patient will likely need long-acting insulin or SGLT2 inhibitor for better glycemic control.   4) CKD? Patient found to have a creatinine of 1.3 on admission. Unclear baseline. Slight bump to 1.4 on day of discharge after heart cath. Patient likely has undiagnosed CKD. He will need follow-up BMP and further work-up in the outpatient. Patient plans to follow-up with PCP for hospital follow-up next week.  5) Left groin nodules Patient was started on doxycycline  after being evaluated in the ED. Patient has a history of groin cyst that has resolved with antibiotics.  Patient reports this current cyst has lasted for about a week and will likely need drainage. Patient plans to follow-up with PCP. Doxycycline was continued at discharge.     Subjective: Patient evaluated at the bedside sitting comfortably in chair.  Patient reported his chest pain had resolved after starting a new medication yesterday. He continues to endorse left groin pain from the nodules but denied any shortness of breath, dizziness or palpitations.  Patient reports he is ready to go home and waiting for clearance from medical team.  Objective Discharge Vitals:   BP (!) 153/70    Pulse 85    Temp 98.4 F (36.9 C) (Oral)    Resp 19    Ht '5\' 10"'$  (1.778 m)    Wt 104.3 kg    SpO2 96%    BMI 32.99 kg/m  Physical exam General: Pleasant, well-appearing middle-age man sitting in chair.  No acute distress. CV: RRR. IIII/VI systolic murmur.  No rubs or gallops.  No LE edema Pulmonary: Lungs CTAB. Normal effort. No wheezing or rales. Abdominal: Soft, nontender, nondistended. Normal bowel  sounds. GU: Multiple firm tender nodules at the base of the scrotum. Mild erythema but no significant fluctuance Extremities: Radial and DP pulses 2+ and symmetric Skin: Warm and dry. No obvious rash or lesions. Neuro: A&Ox3. Moves all extremities. Normal sensation. No focal deficit. Psych: Normal mood and affect   Pertinent Labs, Studies, and Procedures:  CBC Latest Ref Rng & Units 01/20/2022 01/19/2022 07/31/2016  WBC 4.0 - 10.5 K/uL 9.5 8.3 9.9  Hemoglobin 13.0 - 17.0 g/dL 13.5 13.3 16.6  Hematocrit 39.0 - 52.0 % 38.7(L) 38.0(L) 47.4  Platelets 150 - 400 K/uL 164 146(L) 202    CMP Latest Ref Rng & Units 01/20/2022 01/19/2022 07/09/2017  Glucose 70 - 99 mg/dL 239(H) 361(H) -  BUN 6 - 20 mg/dL 18 17 -  Creatinine 0.61 - 1.24 mg/dL 1.40(H) 1.30(H) 1.10  Sodium 135 - 145 mmol/L 134(L) 132(L) -  Potassium 3.5 - 5.1 mmol/L 4.4 4.3 -  Chloride 98 - 111 mmol/L 99 99 -  CO2 22 - 32 mmol/L 23 21(L) -  Calcium 8.9 - 10.3 mg/dL 9.4 9.7 -  Total Protein 6.0 - 8.3 g/dL - - -  Total Bilirubin 0.2 - 1.2 mg/dL - - -  Alkaline Phos 39 - 117 U/L - - -  AST 0 - 37 U/L - - -  ALT 0 - 53 U/L - - -    DG Chest 2 View  Result Date: 01/19/2022 CLINICAL DATA:  61 year old male with history of chest pain. EXAM: CHEST - 2 VIEW COMPARISON:  Chest x-ray 02/17/2013. FINDINGS: Lung volumes are normal. No consolidative airspace disease. No pleural effusions. No pneumothorax. No pulmonary nodule or mass noted. Pulmonary vasculature and the cardiomediastinal silhouette are within normal limits. Atherosclerosis in the thoracic aorta. Status post median sternotomy for CABG. IMPRESSION: 1. No radiographic evidence of acute cardiopulmonary disease. 2. Aortic atherosclerosis. Electronically Signed   By: Vinnie Langton M.D.   On: 01/19/2022 07:47   CARDIAC CATHETERIZATION  Result Date: 01/19/2022   Mid LAD lesion is 50% stenosed.   Mid RCA lesion is 100% stenosed.   Prox Graft lesion before 1st Mrg  is 100% stenosed.    Ost Cx to Mid Cx lesion is 100% stenosed.   Ost LM to Mid LM lesion is 60% stenosed.  Dist LAD-1 lesion is 50% stenosed.   Dist LAD-2 lesion is 90% stenosed.   Prox LAD lesion is 80% stenosed.   Left radial artery graft was visualized by angiography and is normal in caliber.   SVG graft was not injected.   LIMA graft was visualized by angiography and is normal in caliber.   The graft exhibits no disease.   The graft exhibits no disease. Severe three vessel CAD s/p 4V CABG with 2/4 patent bypass grafts Moderate distal left main stenosis Severe mid LAD stenosis. The mid and distal LAD fills from the patent LIMA graft to the mid LAD. The mid and distal LAD is diffusely diseased. The Circumflex is now occluded at the ostium. The sequential vein graft to the first and second obtuse marginal branches is known to be occluded. Several obtuse marginal branches are seen to fill from left to left collaterals. The flush ostial occlusion of the Circumflex is chronic and not favorable for PCI. The RCA is a large dominant vessel with known 100% mid occlusion. The distal branches fill from the patent free radial artery graft to the PDA. Elevated LV filling pressure. Recommendation: No good options for PCI. His Circumflex is now occluded. This appears to be chronic. His troponin level is not elevated and he reports chest pain for over a year. Will maximize anti-anginal therapy. Will resume Imdur and will add Ranexa. IV lasix today post cath with elevated LVEDP. Monitor on telemetry overnight.     Discharge Instructions: Discharge Instructions     Diet - low sodium heart healthy   Complete by: As directed    Increase activity slowly   Complete by: As directed       Mr. Moradi,  It was a pleasure taking care of you at [place] were admitted for chest pain, evaluated by cardiology and your pain was managed with medications.  Cardiology does not recommend any further stents to your heart vessels as  explained to you.  We are discharging you home now that you are doing better.  It is very important to get your diabetes under control in order to decrease your risk of recurrent infections. Please follow the following instructions.  1) Start taking Ranexa 500 mg twice daily 2) Increase your Imdur to 90 mg daily 3) Follow-up with your PCP and cardiology  Take care,  Dr. Linwood Dibbles, George [name], George [name]  Signed: Lacinda Axon, George [name] 01/20/2022, 11:19 AM   Pager: [phone]

## 2022-01-20 NOTE — Care Management Obs Status (Signed)
MEDICARE OBSERVATION STATUS NOTIFICATION ? ? ?Patient Details  ?Name: George [name] ?MRN: [id number] ?Date of Birth: Apr 01, 1961 ? ? ?Medicare Observation Status Notification Given:  Yes ? ? ? ?Aziah Brostrom Darnell Level., RN ?01/20/2022, 10:43 AM ?

## 2022-01-20 NOTE — Discharge Instructions (Addendum)
George [name],  ? ?It was a pleasure taking care of you at [place] were admitted for chest pain, evaluated by cardiology and your pain was managed with medications.  Cardiology does not recommend any further stents to your heart vessels as explained to you.  We are discharging you home now that you are doing better.  It is very important to get your diabetes under control in order to decrease your risk of recurrent infections. Please follow the following instructions.  ?1) Start taking Ranexa 500 mg twice daily ?2) Increase your Imdur to 90 mg daily ?3) Follow-up with your PCP and cardiology ? ?Take care,  ?Dr. Linwood Dibbles, MD, MPH ? ?

## 2022-01-22 ENCOUNTER — Encounter (HOSPITAL_COMMUNITY): Payer: Self-pay | Admitting: Cardiovascular Disease

## 2022-01-22 MED FILL — Verapamil HCl IV Soln 2.5 MG/ML: INTRAVENOUS | Qty: 2 | Status: CN

## 2022-01-22 MED FILL — Heparin Sodium (Porcine) Inj 1000 Unit/ML: INTRAMUSCULAR | Qty: 10 | Status: CN

## 2022-01-24 LAB — CULTURE, BLOOD (ROUTINE X 2)
Culture: NO GROWTH
Culture: NO GROWTH
Special Requests: ADEQUATE
Special Requests: ADEQUATE

## 2022-02-09 ENCOUNTER — Encounter: Payer: Self-pay | Admitting: Internal Medicine

## 2022-02-09 ENCOUNTER — Ambulatory Visit (INDEPENDENT_AMBULATORY_CARE_PROVIDER_SITE_OTHER): Payer: Medicaid Other | Admitting: Internal Medicine

## 2022-02-09 VITALS — BP 150/78 | HR 63 | Ht 70.0 in | Wt 231.8 lb

## 2022-02-09 DIAGNOSIS — I10 Essential (primary) hypertension: Secondary | ICD-10-CM | POA: Diagnosis not present

## 2022-02-09 DIAGNOSIS — I2510 Atherosclerotic heart disease of native coronary artery without angina pectoris: Secondary | ICD-10-CM

## 2022-02-09 DIAGNOSIS — F1290 Cannabis use, unspecified, uncomplicated: Secondary | ICD-10-CM | POA: Diagnosis not present

## 2022-02-09 DIAGNOSIS — Z951 Presence of aortocoronary bypass graft: Secondary | ICD-10-CM

## 2022-02-09 MED ORDER — AMLODIPINE BESYLATE 10 MG PO TABS: 10.0000 mg | ORAL_TABLET | Freq: Every day | ORAL | 3 refills | Status: AC

## 2022-02-09 MED ORDER — ISOSORBIDE MONONITRATE ER 120 MG PO TB24
120.0000 mg | ORAL_TABLET | Freq: Every day | ORAL | 3 refills | Status: DC
Start: 2022-02-09 — End: 2023-01-29

## 2022-02-09 NOTE — Patient Instructions (Signed)
Medication Instructions:  ?Increase Imdur to 120 mg tablets daily ? ?Follow-Up: ?Follow up with Dr. Gasper Sells in the Fall.  ? ?Any Other Special Instructions Will Be Listed Below (If Applicable). ? ? ? ? ?If you need a refill on your cardiac medications before your next appointment, please call your pharmacy. ? ?

## 2022-02-09 NOTE — Progress Notes (Signed)
?Cardiology Office Note:   ? ?Date:  02/09/2022  ? ?ID:  George [name], DOB [birth year], MRN [id number] ? ?PCP:  Alanson Puls, [facility] ?  ?[facility] Providers ?Cardiologist:  Werner Lean, MD    ? ?Referring MD: Alanson Puls [facility]  ? ?CC: Angina ? ?History of Present Illness:   ? ?George [name] is a 61 y.o. male with a hx of CAD w/ prior CABG (2014 LIMA to LAD, L rad to RPDA, SVG OM1, SVG OM2, a dns /p Lcx PCI in 2016), HTN with DM, HLD with DM, Former Tobacco (stopped 05) and Marijuana Use who presents to re-establish care.  At index visit with me had persistent CP but deferred a repeat LHC.  Started Imdur and Lovaza.  Came in 01/19/22 with angina found to have chronically occluded Circ.  There was no target for re-vascularization.  And he received lasix and Ranexa. ? ?Patient notes that he is doing much better on new medication.  Has only needed one nitroglycerin tablet.  This morning had CP just with calming down ? ?No SOB/DOE and no PND/Orthopnea.  No weight gain or leg swelling.  No palpitations or syncope. ? ?At home BP is elevated 150 this AM. ? ?Past Medical History:  ?Diagnosis Date  ? Coronary artery disease   ? a. MI 2005 s/p prior RCA stenting;  b. s/p CABG x 4 (LIMA->LAD, Radial->RPDA, VG->OM1->OM2);  c. 03/2015 Cath/PCI: LAD 50p/m, LCX 99p (3.0x20 Promus Premier DES), OM2 100, RCA 160m Radial->RPDA nl, LIMA->LAD nl, VG->OM1->OM2 100, EF 55-65%.  ? Dyslipidemia   ? History of kidney stones   ? Hyperlipidemia   ? Hypertension   ? Hypertriglyceridemia   ? Marijuana abuse   ? Tobacco abuse   ? Type II diabetes mellitus ([place]   ? ? ?Past Surgical History:  ?Procedure Laterality Date  ? ANGIOPLASTY    ? CARDIAC CATHETERIZATION N/A 04/07/2015  ? Procedure: Right/Left Heart Cath and Coronary/Graft Angiography;  Surgeon: MSherren Mocha MD;  [facility];  Service: Cardiovascular;  Laterality: N/A;  ? CARDIAC CATHETERIZATION N/A 04/07/2015  ? Procedure: Coronary Stent  Intervention;  Surgeon: MSherren Mocha MD;  [facility];  Service: Cardiovascular;  Laterality: N/A;  ? COLONOSCOPY N/A 06/29/2013  ? Procedure: COLONOSCOPY;  Surgeon: SDanie Binder MD;  [facility];  Service: Endoscopy;  Laterality: N/A;  10:45 AM  ? CORONARY ANGIOPLASTY    ? CORONARY ARTERY BYPASS GRAFT N/A 12/31/2012  ? Procedure: CORONARY ARTERY BYPASS GRAFTING (CABG);  Surgeon: SMelrose Nakayama MD;  [place]  Service: Open Heart Surgery;  Laterality: N/A;  times four using left internal mammary artery, left radial artery, endoscopically harvested right saphenous vein  ? CORONARY STENT PLACEMENT  2005  ? LAD50/70, CFX OK, PL1 90, PL2 40, RCA 95->0 with 2.5 x 60 mm Taxus stent, EF 60  ? LEFT HEART CATH AND CORS/GRAFTS ANGIOGRAPHY N/A 01/19/2022  ? Procedure: LEFT HEART CATH AND CORS/GRAFTS ANGIOGRAPHY;  Surgeon: MBurnell Blanks MD;  [facility];  Service: Cardiovascular;  Laterality: N/A;  ? LEFT HEART CATHETERIZATION WITH CORONARY ANGIOGRAM N/A 12/30/2012  ? Procedure: LEFT HEART CATHETERIZATION WITH CORONARY ANGIOGRAM;  Surgeon: PJosue Hector MD;  [facility];  Service: Cardiovascular;  Laterality: N/A;  ? LUMBAR SPINE SURGERY  2005  ? PERCUTANEOUS CORONARY STENT INTERVENTION (PCI-S)  04/07/2015  ? native left circumfles with DES  ? RADIAL ARTERY HARVEST Left  12/31/2012  ? Procedure: RADIAL ARTERY HARVEST;  Surgeon: Melrose Nakayama, MD;  Location: [place];  Service: Open Heart Surgery;  Laterality: Left;  ? ? ?Current Medications: ?Current Meds  ?Medication Sig  ? aspirin EC 81 MG tablet Take 1 tablet (81 mg total) by mouth daily.  ? atorvastatin (LIPITOR) 80 MG tablet Take 40 mg by mouth daily.  ? fenofibrate (TRICOR) 48 MG tablet Take 48 mg by mouth daily.  ? glimepiride (AMARYL) 4 MG tablet Take 4 mg by mouth daily.  ? ibuprofen (ADVIL) 200 MG tablet Take 400 mg by mouth every 6 (six) hours as needed for mild pain or headache.  ?  isosorbide mononitrate (IMDUR) 120 MG 24 hr tablet Take 1 tablet (120 mg total) by mouth daily.  ? lisinopril (ZESTRIL) 40 MG tablet Take 40 mg by mouth daily.  ? metFORMIN (GLUCOPHAGE) 1000 MG tablet Take 1 tablet (1,000 mg total) by mouth 2 (two) times daily with a meal.  ? metoprolol tartrate (LOPRESSOR) 100 MG tablet Take 100 mg by mouth 2 (two) times daily.  ? nitroGLYCERIN (NITROSTAT) 0.4 MG SL tablet Place 1 tablet (0.4 mg total) under the tongue every 5 (five) minutes x 3 doses as needed for chest pain (if no relief after 2nd dose, proceed to ED or call 911).  ? omega-3 acid ethyl esters (LOVAZA) 1 g capsule Take 2 capsules (2 g total) by mouth 2 (two) times daily.  ? ranolazine (RANEXA) 500 MG 12 hr tablet Take 1 tablet (500 mg total) by mouth 2 (two) times daily.  ? [DISCONTINUED] amLODipine (NORVASC) 10 MG tablet Take 10 mg by mouth daily.  ? [DISCONTINUED] isosorbide mononitrate (IMDUR) 30 MG 24 hr tablet Take 3 tablets (90 mg total) by mouth daily.  ?  ? ?Allergies:   Patient has no known allergies.  ? ?Social History  ? ?Socioeconomic History  ? Marital status: Married  ?  Spouse name: Not on file  ? Number of children: Not on file  ? Years of education: Not on file  ? Highest education level: Not on file  ?Occupational History  ? Occupation: Disabled  ?Tobacco Use  ? Smoking status: Former  ?  Packs/day: 2.00  ?  Years: 10.00  ?  Pack years: 20.00  ?  Types: Cigars, Cigarettes  ?  Start date: 06/16/1994  ?  Quit date: 06/12/2004  ?  Years since quitting: 17.6  ? Smokeless tobacco: Never  ?Vaping Use  ? Vaping Use: Never used  ?Substance and Sexual Activity  ? Alcohol use: No  ?  Alcohol/week: 0.0 standard drinks  ?  Comment: Occasional  ? Drug use: Not Currently  ?  Types: Marijuana  ?  Comment: Last time-02/2013  ? Sexual activity: Yes  ?  Partners: Female, Male  ?Other Topics Concern  ? Not on file  ?Social History Narrative  ? Married  ? Sedentary  ? ?Social Determinants of Health  ? ?Financial Resource  Strain: Not on file  ?Food Insecurity: Not on file  ?Transportation Needs: Not on file  ?Physical Activity: Not on file  ?Stress: Not on file  ?Social Connections: Not on file  ?  ?Social: Married, Father died Christmas 2022 ? ?Family History: ?The patient's family history includes Cancer in his father; Diabetes in his mother; Heart disease in his mother; Prostate cancer in his father. There is no history of Colon cancer. ? ?ROS:   ?Please see the history of present illness.    ?  All other systems reviewed and are negative. ? ?EKGs/Labs/Other Studies Reviewed:   ? ?The following studies were reviewed today: ? ?EKG:   ?09/14/21: SR with sinus arrhythmia inferior q waves ? ?Transthoracic Echocardiogram: ?Date: 08/03/2015 ?Results: ?Study Conclusions  ? ?- Left ventricle: The cavity size was normal. Wall thickness was  ?  increased in a pattern of mild LVH. Systolic function was normal.  ?  The estimated ejection fraction was in the range of 60% to 65%.  ?  Wall motion was normal; there were no regional wall motion  ?  abnormalities. Left ventricular diastolic function parameters  ?  were normal.  ?- Aortic valve: Mildly calcified annulus. Trileaflet; mildly  ?  thickened leaflets. Valve area (VTI): 2.58 cm^2. Valve area  ?  (Vmax): 2.83 cm^2.  ?- Mitral valve: Mildly calcified annulus. Normal thickness leaflets  ?  .  ?- Right ventricle: The cavity size was mildly dilated.  ?- Atrial septum: No defect or patent foramen ovale was identified.  ?- Technically adequate study.  ? ?Left/Right Heart Catheterizations: ?Date: 09/14/21 ?Results: ? ?CONCLUSIONS: ?Severe three-vessel coronary artery disease with total occlusion of the RCA, subtotal occlusion of the proximal left circumflex with 99% stenosis, and moderate diffuse 50-60% stenosis of the LAD ?Normal LV systolic function with estimated LVEF 55-65% ?Status post aortocoronary bypass surgery with continued patency of the LIMA to LAD and free radial graft to PDA and total  occlusion of the sequential saphenous graft to OM1 and ?Successful PCI of the native left circumflex with a single drug-eluting stent(Promus premier 3.0 x 20 mm DES) ?Normal right heart hemodynamics ?  ?RECOMMEN

## 2022-04-20 ENCOUNTER — Other Ambulatory Visit: Payer: Self-pay | Admitting: Internal Medicine

## 2022-04-23 ENCOUNTER — Telehealth: Payer: Self-pay | Admitting: Internal Medicine

## 2022-04-23 MED ORDER — RANOLAZINE ER 500 MG PO TB12: 500.0000 mg | ORAL_TABLET | Freq: Two times a day (BID) | ORAL | 1 refills | Status: DC

## 2022-04-23 NOTE — Telephone Encounter (Signed)
*  STAT* If patient is at the pharmacy, call can be transferred to refill team.   1. Which medications need to be refilled? (please list name of each medication and dose if known) ranolazine (RANEXA) 500 MG 12 hr tablet  2. Which pharmacy/location (including street and city if local pharmacy) is medication to be sent to? [id number] - [place], [address]  3. Do they need a 30 day or 90 day supply? 90 day

## 2022-04-23 NOTE — Telephone Encounter (Signed)
90 day refill ranexa 500 mg bid complete

## 2022-09-08 ENCOUNTER — Other Ambulatory Visit: Payer: Self-pay | Admitting: Internal Medicine

## 2022-09-25 ENCOUNTER — Other Ambulatory Visit: Payer: Self-pay | Admitting: Internal Medicine

## 2022-10-10 ENCOUNTER — Other Ambulatory Visit: Payer: Self-pay | Admitting: Internal Medicine

## 2022-10-17 NOTE — Progress Notes (Unsigned)
Cardiology Office Note    Date:  10/18/2022   ID:  George [name], George [name] April 27, 1961, MRN [id number]  PCP:  [facility]  Cardiologist: Werner Lean, MD    Chief Complaint  Patient presents with   Follow-up    6 month visit    History of Present Illness:    George [name] is a 61 y.o. male with past medical history of CAD (s/p CABG in 2014 with LIMA-LAD, Radial-RPDA, SVG-OM1 and SVG-OM2, stent to LCx in 2016), Stage 2-3 CKD, HTN, HLD, Type 2 DM and prior tobacco use who presents to the office today for overdue 18-monthfollow-up.   He was last examined by Dr. CGasper Sellsin 01/2022 following a recent hospitalization for unstable angina and repeat catheterization at that time had shown 2 of 4 patent bypass grafts with patent LIMA to LAD and patent radial to PDA with SVG to OM1 and OM 2 known to be occluded and obtuse marginal branches were filling from left to left collaterals. He did have an ostial occlusion of the LCx which appeared chronic and was not favorable for PCI and continued medical management was recommended. At the time of his office visit, he reported his pain had improved since being started on Ranexa and denied any recent dyspnea on exertion or palpitations. He was continued on his current medical therapy with ASA 81 mg daily, Amlodipine 10 mg daily, Atorvastatin 80 mg daily, Lisinopril 40 mg daily, Lopressor 100 mg twice daily and Ranexa 500 mg twice daily with Imdur being increased to 120 mg daily.  In talking with the patient today, he describes having routine angina with certain activities but denies any increase in frequency or severity of symptoms. He does sometimes take SL NTG x1 with resolution of his symptoms. Reports pain overall improved after being started on Ranexa and Imdur being titrated earlier this year. Reports his breathing has been stable with no recent dyspnea on exertion, orthopnea, PND or pitting edema. Activity at times is limited secondary  to back pain.   Past Medical History:  Diagnosis Date   Coronary artery disease    a. MI 2005 s/p prior RCA stenting;  b. s/p CABG x 4 (LIMA->LAD, Radial->RPDA, VG->OM1->OM2);  c. 03/2015 Cath/PCI: LAD 50p/m, LCX 99p (3.0x20 Promus Premier DES), OM2 100, RCA 1041mRadial->RPDA nl, LIMA->LAD nl, VG->OM1->OM2 100, EF 55-65%.   Dyslipidemia    History of kidney stones    Hyperlipidemia    Hypertension    Hypertriglyceridemia    Marijuana abuse    Tobacco abuse    Type II diabetes mellitus ([place]    Past Surgical History:  Procedure Laterality Date   ANGIOPLASTY     CARDIAC CATHETERIZATION N/A 04/07/2015   Procedure: Right/Left Heart Cath and Coronary/Graft Angiography;  Surgeon: MiSherren MochaMD;  [facility];  Service: Cardiovascular;  Laterality: N/A;   CARDIAC CATHETERIZATION N/A 04/07/2015   Procedure: Coronary Stent Intervention;  Surgeon: MiSherren MochaMD;  [facility];  Service: Cardiovascular;  Laterality: N/A;   COLONOSCOPY N/A 06/29/2013   Procedure: COLONOSCOPY;  Surgeon: SaDanie BinderMD;  [facility];  Service: Endoscopy;  Laterality: N/A;  10:45 AM   CORONARY ANGIOPLASTY     CORONARY ARTERY BYPASS GRAFT N/A 12/31/2012   Procedure: CORONARY ARTERY BYPASS GRAFTING (CABG);  Surgeon: StMelrose NakayamaMD;  [facility] Service: Open Heart Surgery;  Laterality: N/A;  times four using left internal  mammary artery, left radial artery, endoscopically harvested right saphenous vein   CORONARY STENT PLACEMENT  2005   LAD50/70, CFX OK, PL1 90, PL2 40, RCA 95->0 with 2.5 x 60 mm Taxus stent, EF 60   LEFT HEART CATH AND CORS/GRAFTS ANGIOGRAPHY N/A 01/19/2022   Procedure: LEFT HEART CATH AND CORS/GRAFTS ANGIOGRAPHY;  Surgeon: Burnell Blanks, MD;  Location: [facility];  Service: Cardiovascular;  Laterality: N/A;   LEFT HEART CATHETERIZATION WITH CORONARY ANGIOGRAM N/A 12/30/2012   Procedure: LEFT HEART  CATHETERIZATION WITH CORONARY ANGIOGRAM;  Surgeon: Josue Hector, MD;  [facility];  Service: Cardiovascular;  Laterality: N/A;   LUMBAR SPINE SURGERY  2005   PERCUTANEOUS CORONARY STENT INTERVENTION (PCI-S)  04/07/2015   native left circumfles with DES   RADIAL ARTERY HARVEST Left 12/31/2012   Procedure: RADIAL ARTERY HARVEST;  Surgeon: Melrose Nakayama, MD;  Location: [place];  Service: Open Heart Surgery;  Laterality: Left;    Current Medications: Outpatient Medications Prior to Visit  Medication Sig Dispense Refill   amLODipine (NORVASC) 10 MG tablet Take 1 tablet (10 mg total) by mouth daily. 90 tablet 3   aspirin EC 81 MG tablet Take 1 tablet (81 mg total) by mouth daily.     atorvastatin (LIPITOR) 80 MG tablet Take 40 mg by mouth daily.     fenofibrate (TRICOR) 48 MG tablet Take 48 mg by mouth daily.     glimepiride (AMARYL) 4 MG tablet Take 4 mg by mouth daily.     ibuprofen (ADVIL) 200 MG tablet Take 400 mg by mouth every 6 (six) hours as needed for mild pain or headache.     isosorbide mononitrate (IMDUR) 120 MG 24 hr tablet Take 1 tablet (120 mg total) by mouth daily. 90 tablet 3   lisinopril (ZESTRIL) 40 MG tablet Take 40 mg by mouth daily.     metFORMIN (GLUCOPHAGE) 1000 MG tablet Take 1 tablet (1,000 mg total) by mouth 2 (two) times daily with a meal.     metoprolol tartrate (LOPRESSOR) 100 MG tablet Take 100 mg by mouth 2 (two) times daily.     nitroGLYCERIN (NITROSTAT) 0.4 MG SL tablet Place 1 tablet (0.4 mg total) under the tongue every 5 (five) minutes as needed for chest pain. Please contact the office and schedule appointment for additional refills. 1st Attempt. 25 tablet 0   omega-3 acid ethyl esters (LOVAZA) 1 g capsule TAKE 2 CAPSULES BY MOUTH TWICE DAILY 360 capsule 3   ranolazine (RANEXA) 500 MG 12 hr tablet TAKE 1 TABLET(500 MG) BY MOUTH TWICE DAILY 60 tablet 0   No facility-administered medications prior to visit.     Allergies:   Patient has no known  allergies.   Social History   Socioeconomic History   Marital status: Married    Spouse name: Not on file   Number of children: Not on file   Years of education: Not on file   Highest education level: Not on file  Occupational History   Occupation: Disabled  Tobacco Use   Smoking status: Former    Packs/day: 2.00    Years: 10.00    Total pack years: 20.00    Types: Cigars, Cigarettes    Start date: 06/16/1994    Quit date: 06/12/2004    Years since quitting: 18.3   Smokeless tobacco: Never  Vaping Use   Vaping Use: Never used  Substance and Sexual Activity   Alcohol use: No    Alcohol/week: 0.0  standard drinks of alcohol    Comment: Occasional   Drug use: Not Currently    Types: Marijuana    Comment: Last time-02/2013   Sexual activity: Yes    Partners: Female, Male  Other Topics Concern   Not on file  Social History Narrative   Married   Sedentary   Social Determinants of Health   Financial Resource Strain: Not on file  Food Insecurity: Not on file  Transportation Needs: Not on file  Physical Activity: Not on file  Stress: Not on file  Social Connections: Not on file     Family History:  The patient's family history includes Cancer in his father; Diabetes in his mother; Heart disease in his mother; Prostate cancer in his father.   Review of Systems:    Please see the history of present illness.     All other systems reviewed and are otherwise negative except as noted above.   Physical Exam:    VS:  BP 132/84   Pulse 76   Ht '5\' 10"'$  (1.778 m)   Wt 228 lb (103.4 kg)   SpO2 98%   BMI 32.71 kg/m    General: Pleasant male appearing in no acute distress. Head: Normocephalic, atraumatic. Neck: No carotid bruits. JVD not elevated.  Lungs: Respirations regular and unlabored, without wheezes or rales.  Heart: Regular rate and rhythm. No S3 or S4.  No murmur, no rubs, or gallops appreciated. Abdomen: Appears non-distended. No obvious abdominal masses. Msk:   Strength and tone appear normal for age. No obvious joint deformities or effusions. Extremities: No clubbing or cyanosis. No pitting edema.  Distal pedal pulses are 2+ bilaterally. Neuro: Alert and oriented X 3. Moves all extremities spontaneously. No focal deficits noted. Psych:  Responds to questions appropriately with a normal affect. Skin: No rashes or lesions noted  Wt Readings from Last 3 Encounters:  10/18/22 228 lb (103.4 kg)  02/09/22 231 lb 12.8 oz (105.1 kg)  01/19/22 229 lb 15 oz (104.3 kg)     Studies/Labs Reviewed:   EKG:  EKG is not ordered today.   Recent Labs: 01/20/2022: BUN 18; Creatinine, Ser 1.40; Hemoglobin 13.5; Platelets 164; Potassium 4.4; Sodium 134   Lipid Panel    Component Value Date/Time   CHOL 137 01/20/2022 0147   TRIG 176 (H) 01/20/2022 0147   HDL 31 (L) 01/20/2022 0147   CHOLHDL 4.4 01/20/2022 0147   VLDL 35 01/20/2022 0147   LDLCALC 71 01/20/2022 0147    Additional studies/ records that were reviewed today include:   Echocardiogram: 01/2022 IMPRESSIONS     1. Left ventricular ejection fraction, by estimation, is 55 to 60%. The  left ventricle has normal function. The left ventricle has no regional  wall motion abnormalities. There is mild left ventricular hypertrophy.  Left ventricular diastolic parameters  were normal.   2. Right ventricular systolic function is normal. The right ventricular  size is normal. Tricuspid regurgitation signal is inadequate for assessing  PA pressure.   3. The mitral valve is normal in structure. No evidence of mitral valve  regurgitation. No evidence of mitral stenosis.   4. The aortic valve is tricuspid. There is mild calcification of the  aortic valve. There is mild thickening of the aortic valve. Aortic valve  regurgitation is not visualized. Aortic valve sclerosis is present, with  no evidence of aortic valve stenosis.    LHC: 01/2022 Mid LAD lesion is 50% stenosed.   Mid RCA lesion is 100%  stenosed.  Prox Graft lesion before 1st Mrg  is 100% stenosed.   Ost Cx to Mid Cx lesion is 100% stenosed.   Ost LM to Mid LM lesion is 60% stenosed.   Dist LAD-1 lesion is 50% stenosed.   Dist LAD-2 lesion is 90% stenosed.   Prox LAD lesion is 80% stenosed.   Left radial artery graft was visualized by angiography and is normal in caliber.   SVG graft was not injected.   LIMA graft was visualized by angiography and is normal in caliber.   The graft exhibits no disease.   The graft exhibits no disease.   Severe three vessel CAD s/p 4V CABG with 2/4 patent bypass grafts Moderate distal left main stenosis Severe mid LAD stenosis. The mid and distal LAD fills from the patent LIMA graft to the mid LAD. The mid and distal LAD is diffusely diseased.  The Circumflex is now occluded at the ostium. The sequential vein graft to the first and second obtuse marginal branches is known to be occluded. Several obtuse marginal branches are seen to fill from left to left collaterals. The flush ostial occlusion of the Circumflex is chronic and not favorable for PCI.  The RCA is a large dominant vessel with known 100% mid occlusion. The distal branches fill from the patent free radial artery graft to the PDA.  Elevated LV filling pressure.    Recommendation: No good options for PCI. His Circumflex is now occluded. This appears to be chronic. His troponin level is not elevated and he reports chest pain for over a year. Will maximize anti-anginal therapy. Will resume Imdur and will add Ranexa. IV lasix today post cath with elevated LVEDP. Monitor on telemetry overnight.   Assessment:    1. Atherosclerosis of native coronary artery of native heart with stable angina pectoris ([place])   2. S/P CABG (coronary artery bypass graft)   3. Essential hypertension, benign   4. Hyperlipidemia LDL goal <70   5. Type 2 diabetes mellitus with other circulatory complication, without long-term current use of insulin ([place])   6.  CKD (chronic kidney disease) stage 2, GFR 60-89 ml/min      Plan:   In order of problems listed above:  1. CAD with Stable Angina - He is s/p CABG in 2014 with LIMA-LAD, Radial-RPDA, SVG-OM1 and SVG-OM2 with stent to the LCx in 2016. Recent catheterization in 01/2022 showed 2 of 4 patent bypass grafts with patent LIMA to LAD and patent radial to PDA with SVG to OM1 and OM 2 known to be occluded and obtuse marginal branches were filling from left to left collaterals. Continued medical management was recommended.  - He reports stable angina but denies any progression in frequency or severity of symptoms.  We discussed titration of Ranexa from 500 mg twice daily to 1000 mg twice daily but he wishes to continue his current medical therapy at this time since symptoms have been stable. I encouraged him to make [place] aware if this progresses, as medical therapy could be further adjusted. Continue ASA 81 mg daily, Amlodipine 10 mg daily, Imdur 120 mg daily, Lopressor 100 mg twice daily and Ranexa 500 mg twice daily.  2. HTN - His BP was initially recorded at 154/86, rechecked and improved to 132/84. He reports this has overall been well-controlled when checked at home as well. Continue current medical therapy with Amlodipine 10 mg daily, Imdur 120 mg daily, Lisinopril 40 mg daily and Lopressor 100 mg twice daily.  3. HLD -  LDL was at 71 in 01/2022 and at 71 when checked last month. Remains on Atorvastatin '80mg'$  daily, Fenofibrate and Lovaza.   4. Type 2 DM - Followed by his PCP. Hgb A1c had improved to 6.6 when checked in 09/2022.  5. Stage 2-3 CKD - Baseline creatinine 1.3 - 1.4. Was at 1.41 when checked on 09/19/2022.   Medication Adjustments/Labs and Tests Ordered: Current medicines are reviewed at length with the patient today.  Concerns regarding medicines are outlined above.  Medication changes, Labs and Tests ordered today are listed in the Patient Instructions below. Patient Instructions   Medication Instructions:   Continue current medication regimen.   *If you need a refill on your cardiac medications before your next appointment, please call your pharmacy*   Follow-Up: At [facility], you and your health needs are our priority.  As part of our continuing mission to provide you with exceptional heart care, we have created designated Provider Care Teams.  These Care Teams include your primary Cardiologist (physician) and Advanced Practice Providers (APPs -  Physician Assistants and Nurse Practitioners) who all work together to provide you with the care you need, when you need it.  We recommend signing up for the patient portal called "MyChart".  Sign up information is provided on this After Visit Summary.  MyChart is used to connect with patients for Virtual Visits (Telemedicine).  Patients are able to view lab/test results, encounter notes, upcoming appointments, etc.  Non-urgent messages can be sent to your provider as well.   To learn more about what you can do with MyChart, go to [email or web address].    Your next appointment:   6 month(s)  The format for your next appointment:   In Person  Provider:   You may see Werner Lean, MD or one of the following Advanced Practice Providers on your designated Care Team:   [place], PA-C  Ermalinda Barrios, PA-C     Important Information About Sugar         Signed, Erma Heritage, PA-C  10/18/2022 2:02 PM    [facility] [address] [place], [place] [address] Phone: [phone] Fax: [phone]

## 2022-10-18 ENCOUNTER — Ambulatory Visit: Payer: Medicaid Other | Attending: Student | Admitting: Student

## 2022-10-18 ENCOUNTER — Encounter: Payer: Self-pay | Admitting: Student

## 2022-10-18 VITALS — BP 132/84 | HR 76 | Ht 70.0 in | Wt 228.0 lb

## 2022-10-18 DIAGNOSIS — E785 Hyperlipidemia, unspecified: Secondary | ICD-10-CM | POA: Diagnosis present

## 2022-10-18 DIAGNOSIS — N182 Chronic kidney disease, stage 2 (mild): Secondary | ICD-10-CM | POA: Diagnosis present

## 2022-10-18 DIAGNOSIS — I10 Essential (primary) hypertension: Secondary | ICD-10-CM

## 2022-10-18 DIAGNOSIS — I25118 Atherosclerotic heart disease of native coronary artery with other forms of angina pectoris: Secondary | ICD-10-CM | POA: Diagnosis present

## 2022-10-18 DIAGNOSIS — Z951 Presence of aortocoronary bypass graft: Secondary | ICD-10-CM | POA: Insufficient documentation

## 2022-10-18 DIAGNOSIS — E1159 Type 2 diabetes mellitus with other circulatory complications: Secondary | ICD-10-CM | POA: Diagnosis present

## 2022-10-18 NOTE — Patient Instructions (Signed)
Medication Instructions:   Continue current medication regimen.   *If you need a refill on your cardiac medications before your next appointment, please call your pharmacy*   Follow-Up: At [facility], you and your health needs are our priority.  As part of our continuing mission to provide you with exceptional heart care, we have created designated Provider Care Teams.  These Care Teams include your primary Cardiologist (physician) and Advanced Practice Providers (APPs -  Physician Assistants and Nurse Practitioners) who all work together to provide you with the care you need, when you need it.  We recommend signing up for the patient portal called "MyChart".  Sign up information is provided on this After Visit Summary.  MyChart is used to connect with patients for Virtual Visits (Telemedicine).  Patients are able to view lab/test results, encounter notes, upcoming appointments, etc.  Non-urgent messages can be sent to your provider as well.   To learn more about what you can do with MyChart, go to [email or web address].    Your next appointment:   6 month(s)  The format for your next appointment:   In Person  Provider:   You may see Werner Lean, MD or one of the following Advanced Practice Providers on your designated Care Team:   [place], PA-C  Ermalinda Barrios, [place]     Important Information About Sugar

## 2022-10-23 ENCOUNTER — Telehealth: Payer: Self-pay | Admitting: *Deleted

## 2022-10-23 NOTE — Telephone Encounter (Signed)
Called pt. No answer left msg to call back.

## 2022-10-23 NOTE — Telephone Encounter (Signed)
-----   Message from Erma Heritage, [place] sent at 10/23/2022 10:55 AM EST ----- Regarding: Cardiac Rehab? Please let the patient know I reviewed his recent visit with Dr. Gasper Sells and he did mention we could consider cardiac rehab for stable angina if he in interested. At the time of his visit he mentioned chronic back pain so I am not sure if this is something he would physically be able to do but wanted to offer. Can enter the referral if he would like to try the program.    Thanks,  [place]

## 2022-10-25 NOTE — Telephone Encounter (Signed)
Spoke with pt who declined cardiac rehab at this time. Pt thankful for the concern and offer.

## 2022-11-08 ENCOUNTER — Other Ambulatory Visit: Payer: Self-pay | Admitting: Internal Medicine

## 2022-11-26 ENCOUNTER — Other Ambulatory Visit: Payer: Self-pay | Admitting: Internal Medicine

## 2023-01-29 ENCOUNTER — Other Ambulatory Visit: Payer: Self-pay | Admitting: Internal Medicine

## 2023-04-10 ENCOUNTER — Other Ambulatory Visit: Payer: Self-pay | Admitting: Internal Medicine

## 2023-04-23 ENCOUNTER — Ambulatory Visit: Payer: Medicaid Other | Attending: Internal Medicine | Admitting: Internal Medicine

## 2023-04-23 ENCOUNTER — Encounter: Payer: Self-pay | Admitting: Internal Medicine

## 2023-04-23 ENCOUNTER — Other Ambulatory Visit: Payer: Self-pay

## 2023-04-23 VITALS — BP 162/74 | HR 67 | Ht 70.0 in | Wt 233.0 lb

## 2023-04-23 DIAGNOSIS — Z7902 Long term (current) use of antithrombotics/antiplatelets: Secondary | ICD-10-CM | POA: Diagnosis present

## 2023-04-23 DIAGNOSIS — I25118 Atherosclerotic heart disease of native coronary artery with other forms of angina pectoris: Secondary | ICD-10-CM | POA: Insufficient documentation

## 2023-04-23 DIAGNOSIS — I2510 Atherosclerotic heart disease of native coronary artery without angina pectoris: Secondary | ICD-10-CM | POA: Insufficient documentation

## 2023-04-23 MED ORDER — RANOLAZINE ER 1000 MG PO TB12: 1000.0000 mg | ORAL_TABLET | Freq: Two times a day (BID) | ORAL | 1 refills | Status: DC

## 2023-04-23 MED ORDER — ENTRESTO 49-51 MG PO TABS: 1.0000 | ORAL_TABLET | Freq: Two times a day (BID) | ORAL | 1 refills | Status: DC

## 2023-04-23 NOTE — Progress Notes (Signed)
Cardiology Office Note  Date: 04/23/2023   ID: Keagan, Currier 08-Apr-1961, MRN [id number]  PCP:  Ponciano Ort [facility]  Cardiologist:  Christell Constant, MD Electrophysiologist:  None   Reason for Office Visit: CAD f/u   History of Present Illness: George [name] is a 62 y.o. male known to have CAD s/p CABG (LIMA to LAD, radial to RPDA, SVG to OM1 and SVG to OM 2) in 2014, s/p LCx PCI in 2016, DM 2, HLD, stage II CKD, former tobacco abuse is here for follow-up visit.  LHC from 2023 showed patent LIMA to LAD and patent radial to RPDA and known occluded SVG grafts to OM1 and OM 2. He also had ostial LCx lesion not favorable for PCI. Medical management including antianginal therapy uptitration was recommended. He presents today for follow-up visit.  He continues to have chest pains with minimal exertion, almost 10 times per day and depends on activity he does.  He said he got used to chest pains.  His blood pressures are also elevated at home, ranging between 130 and 190 mm negative.  Denies dizziness, palpitations, syncope and leg swelling.  Compliant with medications and has no side effects.  No bleeding complications.  Past Medical History:  Diagnosis Date   Coronary artery disease    a. MI 2005 s/p prior RCA stenting;  b. s/p CABG x 4 (LIMA->LAD, Radial->RPDA, VG->OM1->OM2);  c. 03/2015 Cath/PCI: LAD 50p/m, LCX 99p (3.0x20 Promus Premier DES), OM2 100, RCA 129m, Radial->RPDA nl, LIMA->LAD nl, VG->OM1->OM2 100, EF 55-65%.   Dyslipidemia    History of kidney stones    Hyperlipidemia    Hypertension    Hypertriglyceridemia    Marijuana abuse    Tobacco abuse    Type II diabetes mellitus (HCC)     Past Surgical History:  Procedure Laterality Date   ANGIOPLASTY     CARDIAC CATHETERIZATION N/A 04/07/2015   Procedure: Right/Left Heart Cath and Coronary/Graft Angiography;  Surgeon: Tonny Bollman, MD;  Location: [facility] INVASIVE CV LAB;  Service: Cardiovascular;  Laterality: N/A;    CARDIAC CATHETERIZATION N/A 04/07/2015   Procedure: Coronary Stent Intervention;  Surgeon: Tonny Bollman, MD;  Location: [facility] INVASIVE CV LAB;  Service: Cardiovascular;  Laterality: N/A;   COLONOSCOPY N/A 06/29/2013   Procedure: COLONOSCOPY;  Surgeon: West Bali, MD;  Location: [facility] ENDO SUITE;  Service: Endoscopy;  Laterality: N/A;  10:45 AM   CORONARY ANGIOPLASTY     CORONARY ARTERY BYPASS GRAFT N/A 12/31/2012   Procedure: CORONARY ARTERY BYPASS GRAFTING (CABG);  Surgeon: Loreli Slot, MD;  Location: [facility] OR;  Service: Open Heart Surgery;  Laterality: N/A;  times four using left internal mammary artery, left radial artery, endoscopically harvested right saphenous vein   CORONARY STENT PLACEMENT  2005   LAD50/70, CFX OK, PL1 90, PL2 40, RCA 95->0 with 2.5 x 60 mm Taxus stent, EF 60   LEFT HEART CATH AND CORS/GRAFTS ANGIOGRAPHY N/A 01/19/2022   Procedure: LEFT HEART CATH AND CORS/GRAFTS ANGIOGRAPHY;  Surgeon: Kathleene Hazel, MD;  Location: [facility] INVASIVE CV LAB;  Service: Cardiovascular;  Laterality: N/A;   LEFT HEART CATHETERIZATION WITH CORONARY ANGIOGRAM N/A 12/30/2012   Procedure: LEFT HEART CATHETERIZATION WITH CORONARY ANGIOGRAM;  Surgeon: Wendall Stade, MD;  Location: [facility] CATH LAB;  Service: Cardiovascular;  Laterality: N/A;   LUMBAR SPINE SURGERY  2005   PERCUTANEOUS CORONARY STENT INTERVENTION (PCI-S)  04/07/2015   native left circumfles with DES   RADIAL ARTERY HARVEST Left 12/31/2012  Procedure: RADIAL ARTERY HARVEST;  Surgeon: Loreli Slot, MD;  Location: [facility] OR;  Service: Open Heart Surgery;  Laterality: Left;    Current Outpatient Medications  Medication Sig Dispense Refill   amLODipine (NORVASC) 10 MG tablet Take 1 tablet (10 mg total) by mouth daily. 90 tablet 3   aspirin EC 81 MG tablet Take 1 tablet (81 mg total) by mouth daily.     atorvastatin (LIPITOR) 80 MG tablet Take 40 mg by mouth daily.     fenofibrate 160 MG tablet Take 160 mg by mouth daily.      glimepiride (AMARYL) 4 MG tablet Take 4 mg by mouth daily.     isosorbide mononitrate (IMDUR) 120 MG 24 hr tablet TAKE 1 TABLET(120 MG) BY MOUTH DAILY 90 tablet 1   lisinopril (ZESTRIL) 40 MG tablet Take 40 mg by mouth daily.     metFORMIN (GLUCOPHAGE) 1000 MG tablet Take 1 tablet (1,000 mg total) by mouth 2 (two) times daily with a meal.     metoprolol tartrate (LOPRESSOR) 100 MG tablet Take 100 mg by mouth 2 (two) times daily.     MOUNJARO 5 MG/0.5ML Pen Inject 5 mg into the skin once a week.     omega-3 acid ethyl esters (LOVAZA) 1 g capsule TAKE 2 CAPSULES BY MOUTH TWICE DAILY 360 capsule 3   ranolazine (RANEXA) 500 MG 12 hr tablet TAKE 1 TABLET(500 MG) BY MOUTH TWICE DAILY 60 tablet 6   fenofibrate (TRICOR) 48 MG tablet Take 48 mg by mouth daily. (Patient not taking: Reported on 04/23/2023)     ibuprofen (ADVIL) 200 MG tablet Take 400 mg by mouth every 6 (six) hours as needed for mild pain or headache. (Patient not taking: Reported on 04/23/2023)     nitroGLYCERIN (NITROSTAT) 0.4 MG SL tablet Place 1 tablet (0.4 mg total) under the tongue every 5 (five) minutes as needed for chest pain. Pt needs to keep upcoming appt in June for further refills (Patient not taking: Reported on 04/23/2023) 25 tablet 0   No current facility-administered medications for this visit.   Allergies:  Patient has no known allergies.   Social History: The patient  reports that he quit smoking about 18 years ago. His smoking use included cigars and cigarettes. He started smoking about 28 years ago. He has a 20.00 pack-year smoking history. He has never used smokeless tobacco. He reports that he does not currently use drugs after having used the following drugs: Marijuana. He reports that he does not drink alcohol.   Family History: The patient's family history includes Cancer in his father; Diabetes in his mother; Heart disease in his mother; Prostate cancer in his father.   ROS:  Please see the history of present  illness. Otherwise, complete review of systems is positive for none.  All other systems are reviewed and negative.   Physical Exam: VS:  BP (!) 162/74 (BP Location: Left Arm, Patient Position: Sitting, Cuff Size: Large)   Pulse 67   Ht 5\' 10"  (1.778 m)   Wt 233 lb (105.7 kg)   SpO2 99%   BMI 33.43 kg/m , BMI Body mass index is 33.43 kg/m.  Wt Readings from Last 3 Encounters:  04/23/23 233 lb (105.7 kg)  10/18/22 228 lb (103.4 kg)  02/09/22 231 lb 12.8 oz (105.1 kg)    General: Patient appears comfortable at rest. HEENT: Conjunctiva and lids normal, oropharynx clear with moist mucosa. Neck: Supple, no elevated JVP or carotid bruits, no thyromegaly. Lungs:  Clear to auscultation, nonlabored breathing at rest. Cardiac: Regular rate and rhythm, no S3 or significant systolic murmur, no pericardial rub. Abdomen: Soft, nontender, no hepatomegaly, bowel sounds present, no guarding or rebound. Extremities: No pitting edema, distal pulses 2+. Skin: Warm and dry. Musculoskeletal: No kyphosis. Neuropsychiatric: Alert and oriented x3, affect grossly appropriate.  Recent Labwork: No results found for requested labs within last 365 days.     Component Value Date/Time   CHOL 137 01/20/2022 0147   TRIG 176 (H) 01/20/2022 0147   HDL 31 (L) 01/20/2022 0147   CHOLHDL 4.4 01/20/2022 0147   VLDL 35 01/20/2022 0147   LDLCALC 71 01/20/2022 0147    Assessment and Plan:  Patient is a 62 year old M known to have CAD s/p CABG (LIMA to LAD, radial to RPDA, SVG to OM1 and SVG to OM 2) in 2014, s/p LCx PCI in 2016, DM 2, HLD, stage II CKD, former tobacco abuse is here for follow-up visit.  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 He also had ostial LCx lesion not favorable for  PCI. -Continue cardio prudent medications with aspirin 81 mg once daily and atorvastatin 80 mg nightly, fenofibrate 160 mg once daily, Lovaza 2 g twice daily. -He will need aggressive HTN control to minimize the chest pains.  Switch lisinopril to Entresto 49-51 mg twice daily (after 3-day washout period of lisinopril).  Obtain BMP in 5 days. Continue antianginal therapy with amlodipine 10 mg once daily, Imdur 120 mg once daily, metoprolol tartrate 100 mg twice daily and increase ranolazine from 500 to 1000 mg twice daily.  # HTN, poorly controlled: Continue medications/GDMT as stated above.  # HLD # Hypertriglyceridemia -LDL 71 and TG 176 in 2023.  Continue current medications atorvastatin 80 mg nightly, fenofibrate 160 mg once daily, Lovaza 2 g twice daily.  Goal LDL less than 70.  Lipids are adequately controlled.   I have spent a total of 30 minutes with patient reviewing chart, EKGs, labs and examining patient as well as establishing an assessment and plan that was discussed with the patient.  > 50% of time was spent in direct patient care.    Medication Adjustments/Labs and Tests Ordered: Current medicines are reviewed at length with the patient today.  Concerns regarding medicines are outlined above.   Tests Ordered: Orders Placed This Encounter  Procedures   Basic metabolic panel   EKG 12-Lead     Medication Changes: Meds ordered this encounter  Medications   ranolazine (RANEXA) 1000 MG SR tablet    Sig: Take 1 tablet (1,000 mg total) by mouth 2 (two) times daily.    Dispense:  180 tablet    Refill:  1    04/23/23 dose increased to 1,000 mg bid   sacubitril-valsartan (ENTRESTO) 49-51 MG    Sig: Take 1 tablet by mouth 2 (two) times daily.    Dispense:  180 tablet    Refill:  1    04/24/23 stopped Lisinopril      Disposition:  Follow up  1 year  Signed, Jeanluc Wegman Verne Spurr, MD, 04/23/2023 2:56 PM    [facility] [address], [place], [place] [address]

## 2023-04-23 NOTE — Patient Instructions (Signed)
Medication Instructions:   STOP Lisinopril  On 04/27/23, start Entresto 49/51 mg twice a day   INCREASE Ranexa to 1,000 mg twice a day  Labwork: BMET in 5 days  Testing/Procedures: None today  Follow-Up: 3 months  Any Other Special Instructions Will Be Listed Below (If Applicable).  If you need a refill on your cardiac medications before your next appointment, please call your pharmacy.

## 2023-04-29 NOTE — Progress Notes (Signed)
Order(s) created erroneously. Erroneous order ID: [id number]  Order moved by: Leonia Corona  Order move date/time: 04/29/2023 4:31 PM  Source Patient: [id number]  Source Contact: 04/23/2023  Destination Patient: [id number]  Destination Contact: 04/24/2023

## 2023-05-07 ENCOUNTER — Other Ambulatory Visit: Payer: Self-pay | Admitting: Internal Medicine

## 2023-07-26 ENCOUNTER — Other Ambulatory Visit: Payer: Self-pay | Admitting: Internal Medicine

## 2023-07-30 IMAGING — CR DG CHEST 2V
2 series · 2 of 2 positions shown · non-contrast
Comparison: Chest x-ray 02/17/2013.

CLINICAL DATA: 60-year-old male with history of chest pain.

EXAM:
CHEST - 2 VIEW

[chest pa]
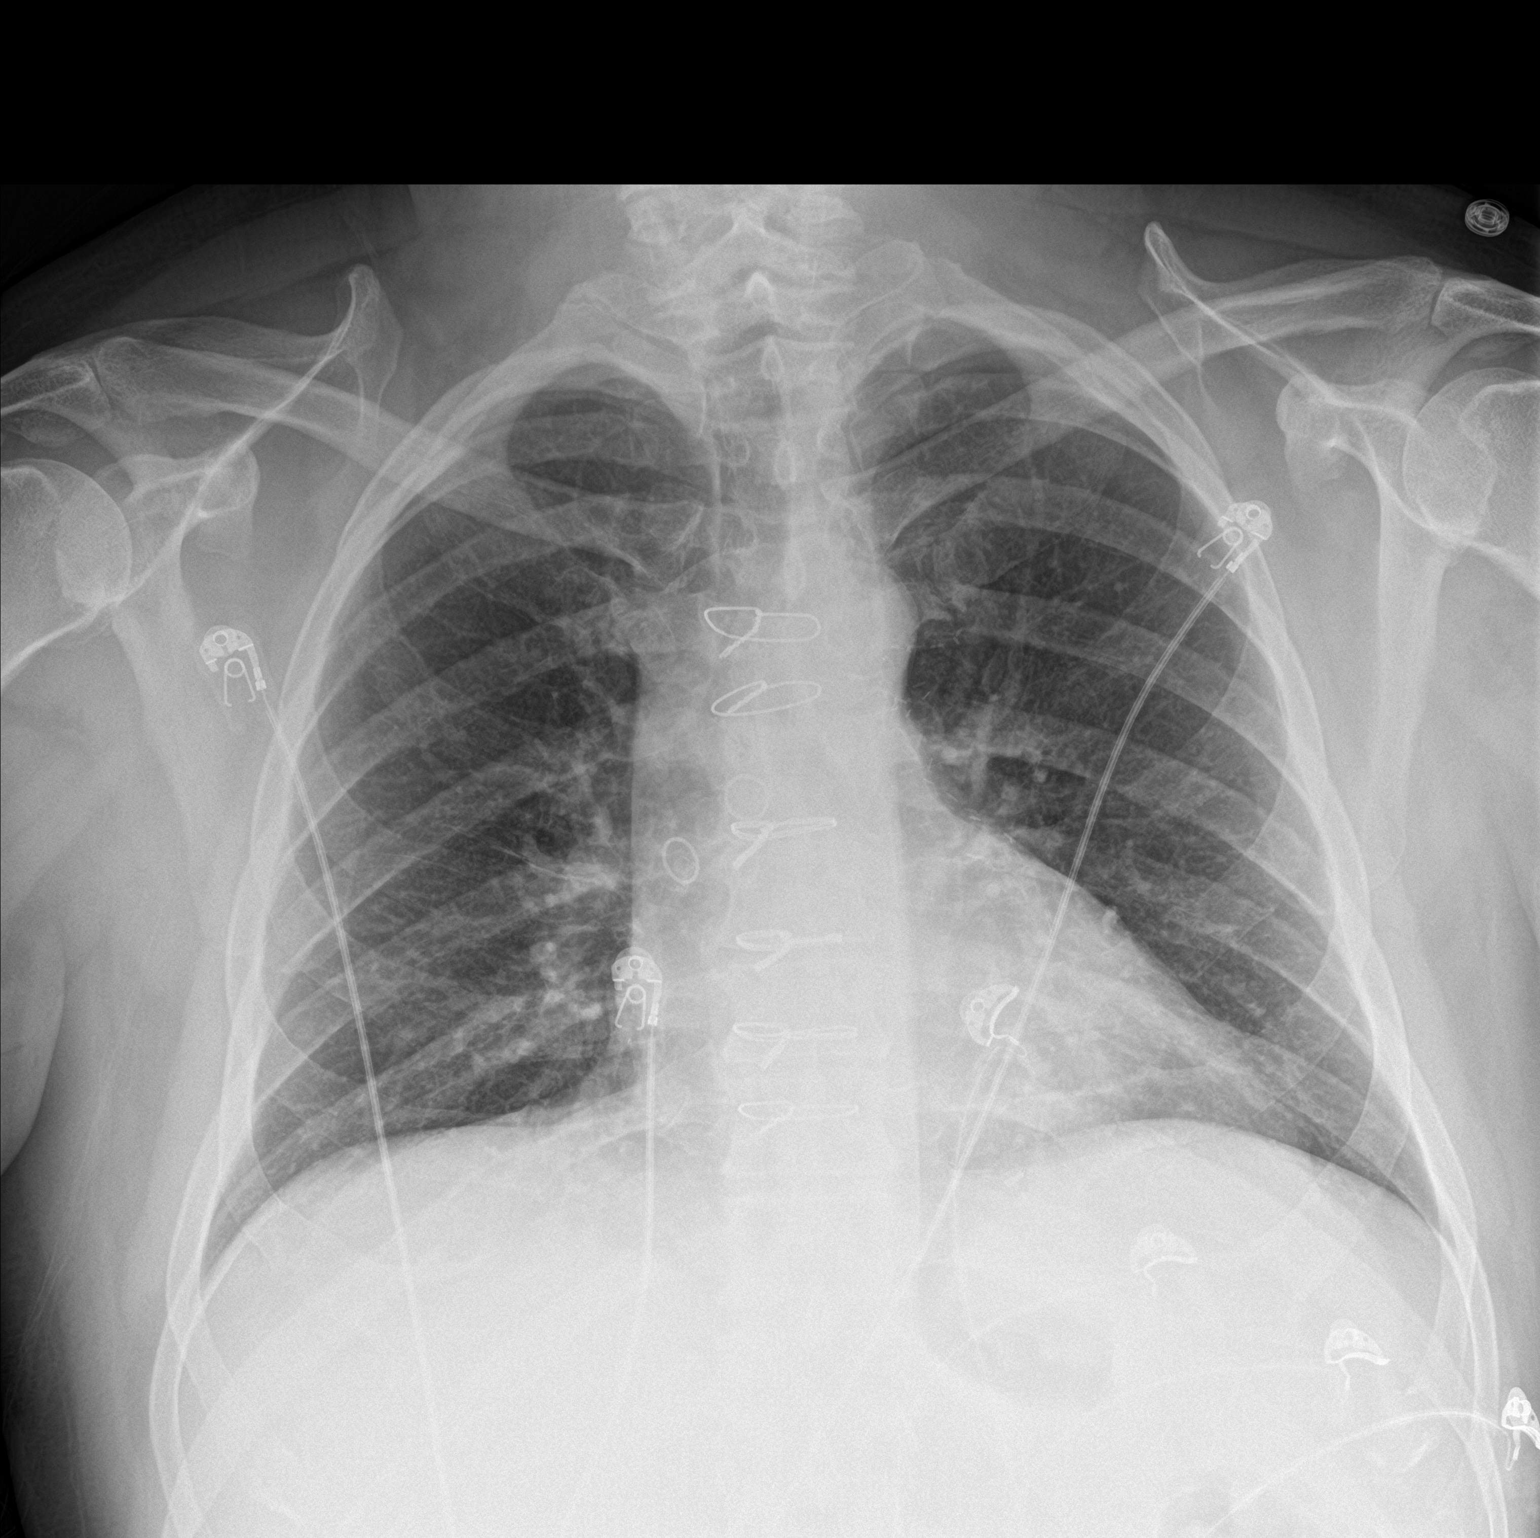

[chest lat]
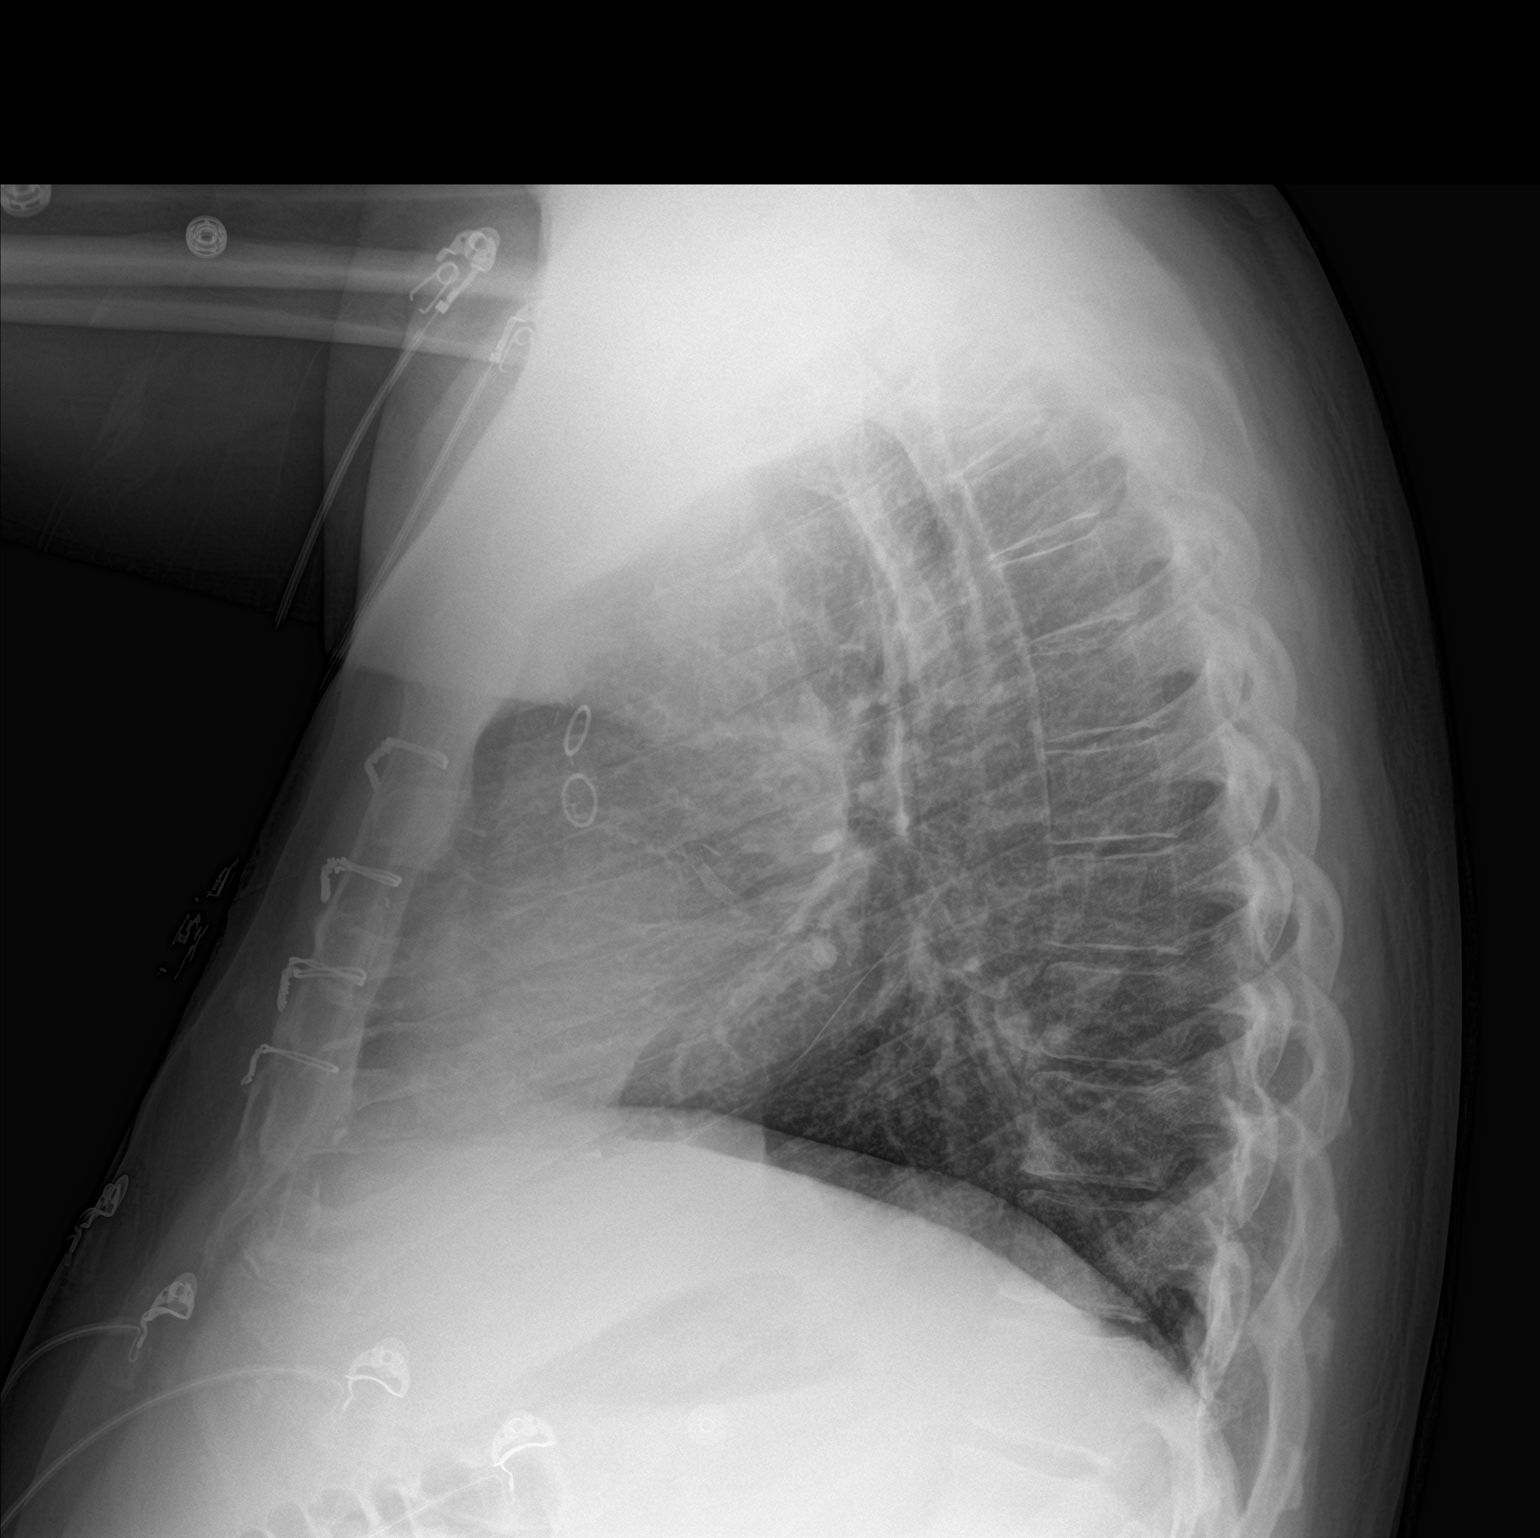

[2 of 2 positions shown; findings below may reference images not displayed]

FINDINGS: Lung volumes are normal. No consolidative airspace disease. No
pleural effusions. No pneumothorax. No pulmonary nodule or mass
noted. Pulmonary vasculature and the cardiomediastinal silhouette
are within normal limits. Atherosclerosis in the thoracic aorta.
Status post median sternotomy for CABG.
IMPRESSION: 1. No radiographic evidence of acute cardiopulmonary disease.
2. Aortic atherosclerosis.

## 2023-08-06 ENCOUNTER — Encounter: Payer: Self-pay | Admitting: Internal Medicine

## 2023-08-06 ENCOUNTER — Ambulatory Visit: Payer: MEDICAID | Attending: Internal Medicine | Admitting: Internal Medicine

## 2023-08-06 VITALS — BP 124/70 | HR 60 | Ht 70.0 in | Wt 230.0 lb

## 2023-08-06 DIAGNOSIS — E781 Pure hyperglyceridemia: Secondary | ICD-10-CM

## 2023-08-06 DIAGNOSIS — I25118 Atherosclerotic heart disease of native coronary artery with other forms of angina pectoris: Secondary | ICD-10-CM

## 2023-08-06 DIAGNOSIS — Z951 Presence of aortocoronary bypass graft: Secondary | ICD-10-CM | POA: Diagnosis not present

## 2023-08-06 DIAGNOSIS — Z7902 Long term (current) use of antithrombotics/antiplatelets: Secondary | ICD-10-CM | POA: Diagnosis not present

## 2023-08-06 DIAGNOSIS — I10 Essential (primary) hypertension: Secondary | ICD-10-CM

## 2023-08-06 DIAGNOSIS — E782 Mixed hyperlipidemia: Secondary | ICD-10-CM

## 2023-08-06 NOTE — Progress Notes (Addendum)
Cardiology Office Note  Date: 08/06/2023   ID: George [name], George [name] May 27, 1961, MRN [id number]  PCP:  Ponciano Ort [facility]  Cardiologist:  Marjo Bicker, MD Electrophysiologist:  None    History of Present Illness: George [name] is a 62 y.o. male known to have CAD s/p CABG (LIMA to LAD, radial to RPDA, SVG to OM1 and SVG to OM 2) in 2014, s/p LCx PCI in 2016, DM 2, HLD, stage II CKD, former tobacco abuse is here for follow-up visit.  LHC from 2023 showed patent LIMA to LAD and patent radial to RPDA and known occluded SVG grafts to OM1 and OM 2. He also had ostial LCx lesion not favorable for PCI. Medical management including antianginal therapy uptitration was recommended. He presents today for follow-up visit.    He continues to have chest pains with minimal exertion, like walking after 5 minutes and sexual activity according to him. Daily chest pains present and multiple times throughout the day.  No DOE, orthopnea, PND, syncope or leg swelling.  Compliant with medications except that he stopped taking Lovaza when he started to have stomach issues with Mounjaro.  Subsequently, lipid panel was done around 5 weeks ago that showed elevated triglycerides 404 and normal LDL 72 (prior TG in 170s)   Past Medical History:  Diagnosis Date   Coronary artery disease    a. MI 2005 s/p prior RCA stenting;  b. s/p CABG x 4 (LIMA->LAD, Radial->RPDA, VG->OM1->OM2);  c. 03/2015 Cath/PCI: LAD 50p/m, LCX 99p (3.0x20 Promus Premier DES), OM2 100, RCA 156m, Radial->RPDA nl, LIMA->LAD nl, VG->OM1->OM2 100, EF 55-65%.   Dyslipidemia    History of kidney stones    Hyperlipidemia    Hypertension    Hypertriglyceridemia    Marijuana abuse    Tobacco abuse    Type II diabetes mellitus (HCC)     Past Surgical History:  Procedure Laterality Date   ANGIOPLASTY     CARDIAC CATHETERIZATION N/A 04/07/2015   Procedure: Right/Left Heart Cath and Coronary/Graft Angiography;  Surgeon: Tonny Bollman, MD;   Location: [facility] INVASIVE CV LAB;  Service: Cardiovascular;  Laterality: N/A;   CARDIAC CATHETERIZATION N/A 04/07/2015   Procedure: Coronary Stent Intervention;  Surgeon: Tonny Bollman, MD;  Location: [facility] INVASIVE CV LAB;  Service: Cardiovascular;  Laterality: N/A;   COLONOSCOPY N/A 06/29/2013   Procedure: COLONOSCOPY;  Surgeon: West Bali, MD;  Location: [facility] ENDO SUITE;  Service: Endoscopy;  Laterality: N/A;  10:45 AM   CORONARY ANGIOPLASTY     CORONARY ARTERY BYPASS GRAFT N/A 12/31/2012   Procedure: CORONARY ARTERY BYPASS GRAFTING (CABG);  Surgeon: Loreli Slot, MD;  Location: [facility] OR;  Service: Open Heart Surgery;  Laterality: N/A;  times four using left internal mammary artery, left radial artery, endoscopically harvested right saphenous vein   CORONARY STENT PLACEMENT  2005   LAD50/70, CFX OK, PL1 90, PL2 40, RCA 95->0 with 2.5 x 60 mm Taxus stent, EF 60   LEFT HEART CATH AND CORS/GRAFTS ANGIOGRAPHY N/A 01/19/2022   Procedure: LEFT HEART CATH AND CORS/GRAFTS ANGIOGRAPHY;  Surgeon: Kathleene Hazel, MD;  Location: [facility] INVASIVE CV LAB;  Service: Cardiovascular;  Laterality: N/A;   LEFT HEART CATHETERIZATION WITH CORONARY ANGIOGRAM N/A 12/30/2012   Procedure: LEFT HEART CATHETERIZATION WITH CORONARY ANGIOGRAM;  Surgeon: Wendall Stade, MD;  Location: [facility] CATH LAB;  Service: Cardiovascular;  Laterality: N/A;   LUMBAR SPINE SURGERY  2005   PERCUTANEOUS CORONARY STENT INTERVENTION (PCI-S)  04/07/2015   native  left circumfles with DES   RADIAL ARTERY HARVEST Left 12/31/2012   Procedure: RADIAL ARTERY HARVEST;  Surgeon: Loreli Slot, MD;  Location: [facility] OR;  Service: Open Heart Surgery;  Laterality: Left;    Current Outpatient Medications  Medication Sig Dispense Refill   amLODipine (NORVASC) 10 MG tablet Take 1 tablet (10 mg total) by mouth daily. 90 tablet 3   aspirin EC 81 MG tablet Take 1 tablet (81 mg total) by mouth daily.     atorvastatin (LIPITOR) 80 MG tablet Take 40 mg by  mouth daily.     fenofibrate 160 MG tablet Take 160 mg by mouth daily.     glimepiride (AMARYL) 4 MG tablet Take 4 mg by mouth daily.     ibuprofen (ADVIL) 200 MG tablet Take 400 mg by mouth every 6 (six) hours as needed for mild pain or headache.     isosorbide mononitrate (IMDUR) 120 MG 24 hr tablet TAKE 1 TABLET(120 MG) BY MOUTH DAILY 90 tablet 1   JARDIANCE 25 MG TABS tablet Take 25 mg by mouth daily.     metFORMIN (GLUCOPHAGE) 1000 MG tablet Take 1 tablet (1,000 mg total) by mouth 2 (two) times daily with a meal.     metoprolol tartrate (LOPRESSOR) 100 MG tablet Take 100 mg by mouth 2 (two) times daily.     nitroGLYCERIN (NITROSTAT) 0.4 MG SL tablet PLACE ONE TABLET UNDER TONGUE AS NEEDED FOR CHEST PAIN EVERY 5 MINUTES. MAX 3 TABLETS PER EVENT. CALL 911 PATIENT NEEDS KEEP UPCOMING APPOINTMENT 25 tablet 2   ranolazine (RANEXA) 1000 MG SR tablet Take 1 tablet (1,000 mg total) by mouth 2 (two) times daily. 180 tablet 1   sacubitril-valsartan (ENTRESTO) 49-51 MG Take 1 tablet by mouth 2 (two) times daily. 180 tablet 1   omega-3 acid ethyl esters (LOVAZA) 1 g capsule TAKE 2 CAPSULES BY MOUTH TWICE DAILY (Patient not taking: Reported on 08/06/2023) 360 capsule 3   No current facility-administered medications for this visit.   Allergies:  Patient has no known allergies.   Social History: The patient  reports that he quit smoking about 19 years ago. His smoking use included cigars and cigarettes. He started smoking about 29 years ago. He has a 20 pack-year smoking history. He has never used smokeless tobacco. He reports that he does not currently use drugs after having used the following drugs: Marijuana. He reports that he does not drink alcohol.   Family History: The patient's family history includes Cancer in his father; Diabetes in his mother; Heart disease in his mother; Prostate cancer in his father.   ROS:  Please see the history of present illness. Otherwise, complete review of systems is  positive for none.  All other systems are reviewed and negative.   Physical Exam: VS:  BP 124/70   Pulse 60   Ht 5\' 10"  (1.778 m)   Wt 230 lb (104.3 kg)   SpO2 96%   BMI 33.00 kg/m , BMI Body mass index is 33 kg/m.  Wt Readings from Last 3 Encounters:  08/06/23 230 lb (104.3 kg)  04/23/23 233 lb (105.7 kg)  10/18/22 228 lb (103.4 kg)    General: Patient appears comfortable at rest. HEENT: Conjunctiva and lids normal, oropharynx clear with moist mucosa. Neck: Supple, no elevated JVP or carotid bruits, no thyromegaly. Lungs: Clear to auscultation, nonlabored breathing at rest. Cardiac: Regular rate and rhythm, no S3 or significant systolic murmur, no pericardial rub. Abdomen: Soft, nontender, no hepatomegaly, bowel  sounds present, no guarding or rebound. Extremities: No pitting edema, distal pulses 2+. Skin: Warm and dry. Musculoskeletal: No kyphosis. Neuropsychiatric: Alert and oriented x3, affect grossly appropriate.  Recent Labwork: No results found for requested labs within last 365 days.     Component Value Date/Time   CHOL 137 01/20/2022 0147   TRIG 176 (H) 01/20/2022 0147   HDL 31 (L) 01/20/2022 0147   CHOLHDL 4.4 01/20/2022 0147   VLDL 35 01/20/2022 0147   LDLCALC 71 01/20/2022 0147    Assessment and Plan:  Patient is a 62 year old M known to have CAD s/p CABG (LIMA to LAD, radial to RPDA, SVG to OM1 and SVG to OM 2) in 2014, s/p LCx PCI in 2016, DM 2, HLD, stage II CKD, former tobacco abuse is here for follow-up visit.  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 Currently on maximal antianginal therapy, amlodipine 10 mg once daily, Imdur 120 mg once daily, metoprolol tartrate 100 mg twice  daily and ranolazine 1000 mg twice daily.  LHC from 2023 showed patent LIMA to LAD, patent radial to RPDA, known occluded SVG grafts to OM1, OM 2, ostial LCx lesion was not favorable for PCI.  Medical management was recommended at that time.  Due to chronic exertional chest tightness despite maximal antianginal therapy, he will benefit from [facility] to evaluate the patency of LIMA to LAD, radial to RPDA and also to see if his anatomy is favorable for CTO LCx intervention. Patient said he will talk to his wife and call the clinic with his decision. -Continue cardioprotective medications with aspirin 81 mg once daily, atorvastatin 80 mg nightly, fenofibrate 160 mg once daily. He stopped taking Lovaza due to stomach issues with Mounjaro.  I reviewed the lipid panel during this time which showed elevated triglycerides, 404 and LDL 72.  I instructed the patient to resume Lovaza.  He will get repeat lipid panel in 6 weeks.  # HTN, controlled: Continue medications as stated above.  # HLD # Hypertriglyceridemia -I reviewed the lipid panel from around 5 weeks ago which showed LDL 72 and elevated triglycerides 104.  He stopped taking Lovaza due to stomach issues with Mounjaro.  Instructed him to resume Lovaza.  He will get repeat lipid panel in 6 weeks.  He is not interested to drive to [facility] for lipid clinic.  I also encouraged him to have his diabetes aggressively controlled which will help with reducing triglyceride levels.     Disposition:  Follow up  3 months  Signed, Koki Buxton Verne Spurr, MD, 08/06/2023 9:37 AM    [facility] at [facility] [address], [place], [place] [address]

## 2023-08-06 NOTE — Patient Instructions (Signed)
Medication Instructions:  Your physician recommends that you continue on your current medications as directed. Please refer to the Current Medication list given to you today.  *If you need a refill on your cardiac medications before your next appointment, please call your pharmacy*   Lab Work: None If you have labs (blood work) drawn today and your tests are completely normal, you will receive your results only by: MyChart Message (if you have MyChart) OR A paper copy in the mail If you have any lab test that is abnormal or we need to change your treatment, we will call you to review the results.   Testing/Procedures: None   Follow-Up: At [facility], you and your health needs are our priority.  As part of our continuing mission to provide you with exceptional heart care, we have created designated Provider Care Teams.  These Care Teams include your primary Cardiologist (physician) and Advanced Practice Providers (APPs -  Physician Assistants and Nurse Practitioners) who all work together to provide you with the care you need, when you need it.  We recommend signing up for the patient portal called "MyChart".  Sign up information is provided on this After Visit Summary.  MyChart is used to connect with patients for Virtual Visits (Telemedicine).  Patients are able to view lab/test results, encounter notes, upcoming appointments, etc.  Non-urgent messages can be sent to your provider as well.   To learn more about what you can do with MyChart, go to [email or web address].    Your next appointment:   3 month(s)  Provider:   You may see Vishnu P Mallipeddi, MD or one of the following Advanced Practice Providers on your designated Care Team:   [place], PA-C  Jacolyn Reedy, [place]     Other Instructions

## 2023-08-07 ENCOUNTER — Telehealth: Payer: Self-pay | Admitting: Internal Medicine

## 2023-08-07 NOTE — Telephone Encounter (Signed)
Pt states he would like the doctor to go ahead and schedule the CATH lab for him. Please advise

## 2023-08-09 ENCOUNTER — Telehealth: Payer: Self-pay | Admitting: Internal Medicine

## 2023-08-09 NOTE — Telephone Encounter (Signed)
Patient returned staff call. 

## 2023-08-09 NOTE — Telephone Encounter (Signed)
Left a message for pt to call office to schedule LHC. Pt will need updated lab work and EKG prior to procedure.

## 2023-08-09 NOTE — Telephone Encounter (Signed)
Spoke to pt who stated that for the last 2 days, he has been home sick with flu like symptoms. Pt stated that he will call our office back when he feels better.

## 2023-08-09 NOTE — Telephone Encounter (Signed)
Please see previous note.

## 2023-09-16 ENCOUNTER — Other Ambulatory Visit: Payer: Self-pay | Admitting: Internal Medicine

## 2023-10-15 ENCOUNTER — Other Ambulatory Visit: Payer: Self-pay | Admitting: Internal Medicine

## 2023-11-01 ENCOUNTER — Other Ambulatory Visit: Payer: Self-pay

## 2023-11-01 MED ORDER — NITROGLYCERIN 0.4 MG SL SUBL: 0.4000 mg | SUBLINGUAL_TABLET | SUBLINGUAL | 2 refills | Status: DC | PRN

## 2023-12-09 ENCOUNTER — Encounter: Payer: Self-pay | Admitting: Internal Medicine

## 2023-12-09 ENCOUNTER — Ambulatory Visit: Payer: MEDICAID | Attending: Internal Medicine | Admitting: Internal Medicine

## 2023-12-09 VITALS — BP 132/88 | HR 60 | Ht 70.0 in | Wt 235.6 lb

## 2023-12-09 DIAGNOSIS — I10 Essential (primary) hypertension: Secondary | ICD-10-CM | POA: Diagnosis not present

## 2023-12-09 DIAGNOSIS — I219 Acute myocardial infarction, unspecified: Secondary | ICD-10-CM | POA: Diagnosis not present

## 2023-12-09 MED ORDER — ISOSORBIDE MONONITRATE ER 120 MG PO TB24: 120.0000 mg | ORAL_TABLET | Freq: Every day | ORAL | 3 refills | Status: DC

## 2023-12-09 NOTE — Patient Instructions (Signed)
Medication Instructions:  Your physician has recommended you make the following change in your medication:   Take Imdur 120 mg in the morning and take 60 mg in the evening   *If you need a refill on your cardiac medications before your next appointment, please call your pharmacy*   Lab Work: NONE   If you have labs (blood work) drawn today and your tests are completely normal, you will receive your results only by: MyChart Message (if you have MyChart) OR A paper copy in the mail If you have any lab test that is abnormal or we need to change your treatment, we will call you to review the results.   Testing/Procedures: NONE    Follow-Up: At [facility], you and your health needs are our priority.  As part of our continuing mission to provide you with exceptional heart care, we have created designated Provider Care Teams.  These Care Teams include your primary Cardiologist (physician) and Advanced Practice Providers (APPs -  Physician Assistants and Nurse Practitioners) who all work together to provide you with the care you need, when you need it.  We recommend signing up for the patient portal called "MyChart".  Sign up information is provided on this After Visit Summary.  MyChart is used to connect with patients for Virtual Visits (Telemedicine).  Patients are able to view lab/test results, encounter notes, upcoming appointments, etc.  Non-urgent messages can be sent to your provider as well.   To learn more about what you can do with MyChart, go to [email or web address].    Your next appointment:   4 month(s)  Provider:   You may see Vishnu P Mallipeddi, MD or one of the following Advanced Practice Providers on your designated Care Team:   Randall An, PA-C  Jacolyn Reedy, PA-C     Other Instructions Thank you for choosing [facility]!

## 2023-12-09 NOTE — Progress Notes (Signed)
Cardiology Office Note  Date: 12/09/2023   ID: Aeron, Donaghey [birth year], MRN [id number]  PCP:  Ponciano Ort [facility]  Cardiologist:  Marjo Bicker, MD Electrophysiologist:  None    History of Present Illness: George [name] is a 63 y.o. male known to have CAD s/p CABG (LIMA to LAD, radial to RPDA, SVG to OM1 and SVG to OM 2) in 2014, s/p LCx PCI in 2016, DM 2, HLD, stage II CKD, former tobacco abuse is here for follow-up visit.  LHC from 2023 showed patent LIMA to LAD and patent radial to RPDA and known occluded SVG grafts to OM1 and OM 2. He also had ostial LCx lesion not favorable for PCI. Medical management including antianginal therapy uptitration was recommended. He presents today for follow-up visit.    He continues to have daily chest pains with minimal exertion, daily and sometimes chest pain that wake him up from sleep.  No DOE, dizziness, syncope or leg swelling.   Past Medical History:  Diagnosis Date   Coronary artery disease    a. MI 2005 s/p prior RCA stenting;  b. s/p CABG x 4 (LIMA->LAD, Radial->RPDA, VG->OM1->OM2);  c. 03/2015 Cath/PCI: LAD 50p/m, LCX 99p (3.0x20 Promus Premier DES), OM2 100, RCA 120m, Radial->RPDA nl, LIMA->LAD nl, VG->OM1->OM2 100, EF 55-65%.   Dyslipidemia    History of kidney stones    Hyperlipidemia    Hypertension    Hypertriglyceridemia    Marijuana abuse    Tobacco abuse    Type II diabetes mellitus (HCC)     Past Surgical History:  Procedure Laterality Date   ANGIOPLASTY     CARDIAC CATHETERIZATION N/A 04/07/2015   Procedure: Right/Left Heart Cath and Coronary/Graft Angiography;  Surgeon: Tonny Bollman, MD;  Location: [facility] INVASIVE CV LAB;  Service: Cardiovascular;  Laterality: N/A;   CARDIAC CATHETERIZATION N/A 04/07/2015   Procedure: Coronary Stent Intervention;  Surgeon: Tonny Bollman, MD;  Location: [facility] INVASIVE CV LAB;  Service: Cardiovascular;  Laterality: N/A;   COLONOSCOPY N/A 06/29/2013   Procedure: COLONOSCOPY;   Surgeon: West Bali, MD;  [facility];  Service: Endoscopy;  Laterality: N/A;  10:45 AM   CORONARY ANGIOPLASTY     CORONARY ARTERY BYPASS GRAFT N/A 12/31/2012   Procedure: CORONARY ARTERY BYPASS GRAFTING (CABG);  Surgeon: Loreli Slot, MD;  Location: [facility] OR;  Service: Open Heart Surgery;  Laterality: N/A;  times four using left internal mammary artery, left radial artery, endoscopically harvested right saphenous vein   CORONARY STENT PLACEMENT  2005   LAD50/70, CFX OK, PL1 90, PL2 40, RCA 95->0 with 2.5 x 60 mm Taxus stent, EF 60   LEFT HEART CATH AND CORS/GRAFTS ANGIOGRAPHY N/A 01/19/2022   Procedure: LEFT HEART CATH AND CORS/GRAFTS ANGIOGRAPHY;  Surgeon: Kathleene Hazel, MD;  Location: [facility] INVASIVE CV LAB;  Service: Cardiovascular;  Laterality: N/A;   LEFT HEART CATHETERIZATION WITH CORONARY ANGIOGRAM N/A 12/30/2012   Procedure: LEFT HEART CATHETERIZATION WITH CORONARY ANGIOGRAM;  Surgeon: Wendall Stade, MD;  Location: [facility] CATH LAB;  Service: Cardiovascular;  Laterality: N/A;   LUMBAR SPINE SURGERY  2005   PERCUTANEOUS CORONARY STENT INTERVENTION (PCI-S)  04/07/2015   native left circumfles with DES   RADIAL ARTERY HARVEST Left 12/31/2012   Procedure: RADIAL ARTERY HARVEST;  Surgeon: Loreli Slot, MD;  Location: [facility] OR;  Service: Open Heart Surgery;  Laterality: Left;    Current Outpatient Medications  Medication Sig Dispense Refill   amLODipine (NORVASC) 10 MG  tablet Take 1 tablet (10 mg total) by mouth daily. 90 tablet 3   aspirin EC 81 MG tablet Take 1 tablet (81 mg total) by mouth daily.     atorvastatin (LIPITOR) 80 MG tablet Take 40 mg by mouth daily.     fenofibrate 160 MG tablet Take 160 mg by mouth daily.     glimepiride (AMARYL) 4 MG tablet Take 4 mg by mouth daily.     ibuprofen (ADVIL) 200 MG tablet Take 400 mg by mouth every 6 (six) hours as needed for mild pain or headache.     isosorbide mononitrate (IMDUR) 120 MG 24 hr tablet Take 1 tablet  (120 mg total) by mouth daily. Take 1 tablet ( 120 mg) in the morning and 1/2 tablet ( 60 mg) in the evening 135 tablet 3   JARDIANCE 25 MG TABS tablet Take 25 mg by mouth daily.     LANTUS SOLOSTAR 100 UNIT/ML Solostar Pen Inject 12 Units into the skin daily.     metFORMIN (GLUCOPHAGE) 1000 MG tablet Take 1 tablet (1,000 mg total) by mouth 2 (two) times daily with a meal.     metoprolol tartrate (LOPRESSOR) 100 MG tablet Take 100 mg by mouth 2 (two) times daily.     nitroGLYCERIN (NITROSTAT) 0.4 MG SL tablet Place 1 tablet (0.4 mg total) under the tongue every 5 (five) minutes as needed for chest pain. If a single episode of chest pain is not relieved by one tablet, the patient will try another within 5 minutes; and if this doesn't relieve the pain, the patient is instructed to call 911 for transportation to an emergency department. 25 tablet 2   omega-3 acid ethyl esters (LOVAZA) 1 g capsule TAKE 2 CAPSULES BY MOUTH TWICE DAILY 360 capsule 3   ranolazine (RANEXA) 1000 MG SR tablet TAKE 1 TABLET(1000 MG) BY MOUTH TWICE DAILY 180 tablet 1   sacubitril-valsartan (ENTRESTO) 49-51 MG TAKE 1 TABLET BY MOUTH TWICE DAILY 180 tablet 2   No current facility-administered medications for this visit.   Allergies:  Semaglutide   Social History: The patient  reports that he quit smoking about 19 years ago. His smoking use included cigars and cigarettes. He started smoking about 29 years ago. He has a 20 pack-year smoking history. He has never used smokeless tobacco. He reports that he does not currently use drugs after having used the following drugs: Marijuana. He reports that he does not drink alcohol.   Family History: The patient's family history includes Cancer in his father; Diabetes in his mother; Heart disease in his mother; Prostate cancer in his father.   ROS:  Please see the history of present illness. Otherwise, complete review of systems is positive for none.  All other systems are reviewed and  negative.   Physical Exam: VS:  BP 132/88   Pulse 60   Ht 5\' 10"  (1.778 m)   Wt 235 lb 9.6 oz (106.9 kg)   SpO2 99%   BMI 33.81 kg/m , BMI Body mass index is 33.81 kg/m.  Wt Readings from Last 3 Encounters:  12/09/23 235 lb 9.6 oz (106.9 kg)  08/06/23 230 lb (104.3 kg)  04/23/23 233 lb (105.7 kg)    General: Patient appears comfortable at rest. HEENT: Conjunctiva and lids normal, oropharynx clear with moist mucosa. Neck: Supple, no elevated JVP or carotid bruits, no thyromegaly. Lungs: Clear to auscultation, nonlabored breathing at rest. Cardiac: Regular rate and rhythm, no S3 or significant systolic murmur, no pericardial  rub. Abdomen: Soft, nontender, no hepatomegaly, bowel sounds present, no guarding or rebound. Extremities: No pitting edema, distal pulses 2+. Skin: Warm and dry. Musculoskeletal: No kyphosis. Neuropsychiatric: Alert and oriented x3, affect grossly appropriate.  Recent Labwork: No results found for requested labs within last 365 days.     Component Value Date/Time   CHOL 137 01/20/2022 0147   TRIG 176 (H) 01/20/2022 0147   HDL 31 (L) 01/20/2022 0147   CHOLHDL 4.4 01/20/2022 0147   VLDL 35 01/20/2022 0147   LDLCALC 71 01/20/2022 0147    Assessment and Plan:  Patient is a 63 year old M known to have CAD s/p CABG (LIMA to LAD, radial to RPDA, SVG to OM1 and SVG to OM 2) in 2014, s/p LCx PCI in 2016, DM 2, HLD, stage II CKD, former tobacco abuse is here for follow-up visit.  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  Will increase Imdur from 120 mg in the a.m. to 120 mg in the a.m. and 60 mg in the p.m. as chest pains have been waking him up from the sleep intermittently.  Will continue the remaining antianginal therapy with amlodipine 10  mg once daily, metoprolol tartrate 100 mg twice daily, ranolazine 1000 mg twice daily.  Continue cardioprotective medications, aspirin 81 mg once daily and atorvastatin 40 mg nightly, fenofibrate 160 mg once daily, Lovaza 2 g twice daily.  He does not want to take opioids for pain control (as it took him a long time to come off of it in the past). -LHC from 2023 showed patent LIMA to LAD, patent radial to RPDA, known occluded SVG grafts to OM1, OM 2, ostial LCx lesion was not favorable for PCI.  Medical management was recommended at that time.   # HTN, controlled: Continue above medications.  # HLD # Hypertriglyceridemia -Continue atorvastatin 40 mg nightly, fenofibrate 160 mg once daily, Lovaza 2 g twice daily.  Lipid panel from December 2024 reviewed, TG 451, elevated and LDL 78.  Aggressive DM2 control.     Disposition:  Follow up 4 months  Signed, George Cybulski Verne Spurr, MD, 12/09/2023 4:58 PM    [facility] at [facility] [address] [place], [place] [address]

## 2024-01-02 ENCOUNTER — Telehealth: Payer: Self-pay | Admitting: Internal Medicine

## 2024-01-02 MED ORDER — NITROGLYCERIN 0.4 MG SL SUBL: 0.4000 mg | SUBLINGUAL_TABLET | SUBLINGUAL | 2 refills | Status: DC | PRN

## 2024-01-02 NOTE — Telephone Encounter (Signed)
*  STAT* If patient is at the pharmacy, call can be transferred to refill team.   1. Which medications need to be refilled? (please list name of each medication and dose if known) nitroGLYCERIN (NITROSTAT) 0.4 MG SL tablet   2. Which pharmacy/location (including street and city if local pharmacy) is medication to be sent to? [id number] - [place], [place] - [address] [phone]   3. Do they need a 30 day or 90 day supply? 30

## 2024-01-02 NOTE — Telephone Encounter (Signed)
 Refilled

## 2024-01-16 ENCOUNTER — Telehealth: Payer: Self-pay | Admitting: Internal Medicine

## 2024-01-16 ENCOUNTER — Other Ambulatory Visit: Payer: Self-pay | Admitting: Internal Medicine

## 2024-01-16 MED ORDER — ISOSORBIDE MONONITRATE ER 120 MG PO TB24: 120.0000 mg | ORAL_TABLET | Freq: Every day | ORAL | 3 refills | Status: DC

## 2024-01-16 NOTE — Telephone Encounter (Signed)
 Already refilled on 12/09/23  Sent again to [facility], patient aware

## 2024-01-16 NOTE — Telephone Encounter (Signed)
*  STAT* If patient is at the pharmacy, call can be transferred to refill team.   1. Which medications need to be refilled? (please list name of each medication and dose if known) isosorbide mononitrate (IMDUR) 120 MG 24 hr tablet      2. Would you like to learn more about the convenience, safety, & potential cost savings by using the [facility]?      3. Are you open to using the [facility] (Type [facility]. ).   4. Which pharmacy/location (including street and city if local pharmacy) is medication to be sent to? [id number] - [place],  - [address]    5. Do they need a 30 day or 90 day supply? 90

## 2024-02-20 ENCOUNTER — Other Ambulatory Visit: Payer: Self-pay | Admitting: Internal Medicine

## 2024-03-27 ENCOUNTER — Other Ambulatory Visit: Payer: Self-pay | Admitting: Internal Medicine

## 2024-04-13 ENCOUNTER — Other Ambulatory Visit: Payer: Self-pay | Admitting: Internal Medicine

## 2024-04-15 ENCOUNTER — Encounter: Payer: Self-pay | Admitting: Internal Medicine

## 2024-04-15 ENCOUNTER — Ambulatory Visit: Payer: MEDICAID | Attending: Internal Medicine | Admitting: Internal Medicine

## 2024-04-15 ENCOUNTER — Encounter: Payer: Self-pay | Admitting: Adult Health

## 2024-04-15 VITALS — BP 128/78 | HR 63 | Ht 70.0 in | Wt 231.6 lb

## 2024-04-15 DIAGNOSIS — I10 Essential (primary) hypertension: Secondary | ICD-10-CM | POA: Diagnosis present

## 2024-04-15 DIAGNOSIS — Z951 Presence of aortocoronary bypass graft: Secondary | ICD-10-CM | POA: Diagnosis not present

## 2024-04-15 DIAGNOSIS — I25112 Atherosclerotic heart disease of native coronary artery with refractory angina pectoris: Secondary | ICD-10-CM | POA: Insufficient documentation

## 2024-04-15 DIAGNOSIS — E782 Mixed hyperlipidemia: Secondary | ICD-10-CM | POA: Insufficient documentation

## 2024-04-15 DIAGNOSIS — E781 Pure hyperglyceridemia: Secondary | ICD-10-CM | POA: Diagnosis present

## 2024-04-15 DIAGNOSIS — Z7902 Long term (current) use of antithrombotics/antiplatelets: Secondary | ICD-10-CM | POA: Insufficient documentation

## 2024-04-15 MED ORDER — ISOSORBIDE MONONITRATE ER 120 MG PO TB24: 120.0000 mg | ORAL_TABLET | Freq: Two times a day (BID) | ORAL | 3 refills | Status: AC

## 2024-04-15 NOTE — Progress Notes (Signed)
 Cardiology Office Note  Date: 04/15/2024   ID: George [name] 1960-12-27, MRN [id number]  PCP:  Bryna Car [facility]  Cardiologist:  Lasalle Pointer, MD Electrophysiologist:  None    History of Present Illness: George [name] is a 63 y.o. male known to have CAD s/p CABG (LIMA to LAD, radial to RPDA, SVG to OM1 and SVG to OM 2) in 2014, s/p LCx PCI in 2016, DM 2, HLD, stage II CKD, former tobacco abuse is here for follow-up visit.  LHC from 2023 showed patent LIMA to LAD and patent radial to RPDA and known occluded SVG grafts to OM1 and OM 2. He also had ostial LCx lesion not favorable for PCI. Medical management including antianginal therapy uptitration was recommended. He presents today for follow-up visit.    He continues to have daily chest pains despite adding 60 mg Imdur  dose in the evening.  It did wake him up from sleep few times per week and this has been an ongoing issue.  He thinks this could be from stress related.  But did not have severe chest pains requiring him to go to the ER.  Does not have any other symptoms of dizziness, palpitations, syncope or leg swelling.  Compliant with medications.   Past Medical History:  Diagnosis Date   Coronary artery disease    a. MI 2005 s/p prior RCA stenting;  b. s/p CABG x 4 (LIMA->LAD, Radial->RPDA, VG->OM1->OM2);  c. 03/2015 Cath/PCI: LAD 50p/m, LCX 99p (3.0x20 Promus Premier DES), OM2 100, RCA 163m, Radial->RPDA nl, LIMA->LAD nl, VG->OM1->OM2 100, EF 55-65%.   Dyslipidemia    History of kidney stones    Hyperlipidemia    Hypertension    Hypertriglyceridemia    Marijuana abuse    Tobacco abuse    Type II diabetes mellitus (HCC)     Past Surgical History:  Procedure Laterality Date   ANGIOPLASTY     CARDIAC CATHETERIZATION N/A 04/07/2015   Procedure: Right/Left Heart Cath and Coronary/Graft Angiography;  Surgeon: Arnoldo Lapping, MD;  Location: [facility] INVASIVE CV LAB;  Service: Cardiovascular;  Laterality: N/A;   CARDIAC  CATHETERIZATION N/A 04/07/2015   Procedure: Coronary Stent Intervention;  Surgeon: Arnoldo Lapping, MD;  Location: [facility] INVASIVE CV LAB;  Service: Cardiovascular;  Laterality: N/A;   COLONOSCOPY N/A 06/29/2013   Procedure: COLONOSCOPY;  Surgeon: Alyce Jubilee, MD;  [facility];  Service: Endoscopy;  Laterality: N/A;  10:45 AM   CORONARY ANGIOPLASTY     CORONARY ARTERY BYPASS GRAFT N/A 12/31/2012   Procedure: CORONARY ARTERY BYPASS GRAFTING (CABG);  Surgeon: Zelphia Higashi, MD;  Location: [facility] OR;  Service: Open Heart Surgery;  Laterality: N/A;  times four using left internal mammary artery, left radial artery, endoscopically harvested right saphenous vein   CORONARY STENT PLACEMENT  2005   LAD50/70, CFX OK, PL1 90, PL2 40, RCA 95->0 with 2.5 x 60 mm Taxus stent, EF 60   LEFT HEART CATH AND CORS/GRAFTS ANGIOGRAPHY N/A 01/19/2022   Procedure: LEFT HEART CATH AND CORS/GRAFTS ANGIOGRAPHY;  Surgeon: Odie Benne, MD;  Location: [facility] INVASIVE CV LAB;  Service: Cardiovascular;  Laterality: N/A;   LEFT HEART CATHETERIZATION WITH CORONARY ANGIOGRAM N/A 12/30/2012   Procedure: LEFT HEART CATHETERIZATION WITH CORONARY ANGIOGRAM;  Surgeon: Loyde Rule, MD;  Location: [facility] CATH LAB;  Service: Cardiovascular;  Laterality: N/A;   LUMBAR SPINE SURGERY  2005   PERCUTANEOUS CORONARY STENT INTERVENTION (PCI-S)  04/07/2015   native left circumfles with DES  RADIAL ARTERY HARVEST Left 12/31/2012   Procedure: RADIAL ARTERY HARVEST;  Surgeon: Zelphia Higashi, MD;  Location: [facility] OR;  Service: Open Heart Surgery;  Laterality: Left;    Current Outpatient Medications  Medication Sig Dispense Refill   amLODipine  (NORVASC ) 10 MG tablet Take 1 tablet (10 mg total) by mouth daily. 90 tablet 3   aspirin  EC 81 MG tablet Take 1 tablet (81 mg total) by mouth daily.     atorvastatin  (LIPITOR) 80 MG tablet Take 40 mg by mouth daily.     fenofibrate  160 MG tablet Take 160 mg by mouth daily.     glimepiride  (AMARYL) 4 MG tablet Take 4 mg by mouth daily.     ibuprofen (ADVIL) 200 MG tablet Take 400 mg by mouth every 6 (six) hours as needed for mild pain or headache.     isosorbide  mononitrate (IMDUR ) 120 MG 24 hr tablet Take 1 tablet (120 mg total) by mouth daily. Take 1 tablet ( 120 mg) in the morning and 1/2 tablet ( 60 mg) in the evening 135 tablet 3   JARDIANCE 25 MG TABS tablet Take 25 mg by mouth daily.     LANTUS  SOLOSTAR 100 UNIT/ML Solostar Pen Inject 12 Units into the skin daily.     metFORMIN  (GLUCOPHAGE ) 1000 MG tablet Take 1 tablet (1,000 mg total) by mouth 2 (two) times daily with a meal.     metoprolol  tartrate (LOPRESSOR ) 100 MG tablet Take 100 mg by mouth 2 (two) times daily.     nitroGLYCERIN  (NITROSTAT ) 0.4 MG SL tablet PLACE 1 TABLET UNDER THE TONGUE EVERY 5 MINUTES AS NEEDED FOR CHEST PAIN FOR 3 DOSES. CALL 911 IF SYMPTOMS NOT RELIEVED AFTER 2 TABLETS 25 tablet 7   ranolazine  (RANEXA ) 1000 MG SR tablet TAKE 1 TABLET(1000 MG) BY MOUTH TWICE DAILY 180 tablet 1   sacubitril-valsartan (ENTRESTO ) 49-51 MG TAKE 1 TABLET BY MOUTH TWICE DAILY 180 tablet 2   No current facility-administered medications for this visit.   Allergies:  Semaglutide   Social History: The patient  reports that he quit smoking about 19 years ago. His smoking use included cigars and cigarettes. He started smoking about 29 years ago. He has a 20 pack-year smoking history. He has never used smokeless tobacco. He reports that he does not currently use drugs after having used the following drugs: Marijuana. He reports that he does not drink alcohol.   Family History: The patient's family history includes Cancer in his father; Diabetes in his mother; Heart disease in his mother; Prostate cancer in his father.   ROS:  Please see the history of present illness. Otherwise, complete review of systems is positive for none.  All other systems are reviewed and negative.   Physical Exam: VS:  BP 128/78 (BP Location: Left  Arm, Patient Position: Sitting, Cuff Size: Normal)   Pulse 63   Ht 5\' 10"  (1.778 m)   Wt 231 lb 9.6 oz (105.1 kg)   SpO2 99%   BMI 33.23 kg/m , BMI Body mass index is 33.23 kg/m.  Wt Readings from Last 3 Encounters:  04/15/24 231 lb 9.6 oz (105.1 kg)  12/09/23 235 lb 9.6 oz (106.9 kg)  08/06/23 230 lb (104.3 kg)    General: Patient appears comfortable at rest. HEENT: Conjunctiva and lids normal, oropharynx clear with moist mucosa. Neck: Supple, no elevated JVP or carotid bruits, no thyromegaly. Lungs: Clear to auscultation, nonlabored breathing at rest. Cardiac: Regular rate and rhythm, no S3 or  significant systolic murmur, no pericardial rub. Abdomen: Soft, nontender, no hepatomegaly, bowel sounds present, no guarding or rebound. Extremities: No pitting edema, distal pulses 2+. Skin: Warm and dry. Musculoskeletal: No kyphosis. Neuropsychiatric: Alert and oriented x3, affect grossly appropriate.  Recent Labwork: No results found for requested labs within last 365 days.     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  Increase Imdur  from 120 mg in a.m. and 60 mg p.m. to 120 mg twice daily.  Continue remaining antianginal therapy, amlodipine  10 mg once daily, metoprolol  tartrate 100 mg twice daily, ranolazine  1000 mg twice daily.  His chest pains could likely be secondary to severe coronary microvascular dysfunction due to IDDM2 and severe CAD. Continue cardioprotective medications, aspirin  81 mg once daily, atorvastatin  40 mg nightly,  fenofibrate  160 mg once daily.  Not taking Lovaza  anymore, will obtain the updated lipid panel report from the PCP.  Does not want to take opioids for chest pain. -LHC from 2023 showed patent LIMA to LAD, patent radial to RPDA, known occluded SVG grafts to OM1, OM 2, ostial LCx lesion was not favorable for PCI.  Medical management was recommended at that time.   # HTN, controlled: Continue above medications.  Continue Entresto  49-51 mg twice daily.  # HLD # Hypertriglyceridemia - Continue atorvastatin  40 mg nightly, fenofibrate  160 mg once daily.  Not taking Lovaza  anymore, will obtain updated lipid panel from the PCP. Lipid panel from December 2024 reviewed, TG 451, elevated and LDL 78.  Aggressive DM2 control.     Disposition:  Follow up 3 months  Signed, Delayni Streed Beauford Bounds, MD, 04/15/2024 10:17 AM    [facility] at [facility] [address] [place], [place] [address]

## 2024-04-15 NOTE — Patient Instructions (Signed)
 Medication Instructions:  Your physician has recommended you make the following change in your medication:   -Start Imdur  (Isosorbide  Mononitrate) 120 mg twice daily   *If you need a refill on your cardiac medications before your next appointment, please call your pharmacy*  Lab Work: None If you have labs (blood work) drawn today and your tests are completely normal, you will receive your results only by: MyChart Message (if you have MyChart) OR A paper copy in the mail If you have any lab test that is abnormal or we need to change your treatment, we will call you to review the results.  Testing/Procedures: None  Follow-Up: At [facility], you and your health needs are our priority.  As part of our continuing mission to provide you with exceptional heart care, our providers are all part of one team.  This team includes your primary Cardiologist (physician) and Advanced Practice Providers or APPs (Physician Assistants and Nurse Practitioners) who all work together to provide you with the care you need, when you need it.  Your next appointment:   3 month(s)  Provider:   You may see Vishnu P Mallipeddi, MD or one of the following Advanced Practice Providers on your designated Care Team:   [place], PA-C  Scotesia [place], [place] Theotis Flake, [place]     We recommend signing up for the patient portal called "MyChart".  Sign up information is provided on this After Visit Summary.  MyChart is used to connect with patients for Virtual Visits (Telemedicine).  Patients are able to view lab/test results, encounter notes, upcoming appointments, etc.  Non-urgent messages can be sent to your provider as well.   To learn more about what you can do with MyChart, go to [email or web address].   Other Instructions

## 2024-04-17 ENCOUNTER — Ambulatory Visit: Payer: Self-pay | Admitting: Internal Medicine

## 2024-04-27 NOTE — Telephone Encounter (Signed)
 Pt returning call

## 2024-06-19 ENCOUNTER — Other Ambulatory Visit: Payer: Self-pay | Admitting: Internal Medicine

## 2024-07-16 NOTE — Progress Notes (Unsigned)
 Cardiology Office Note    Date:  07/17/2024  ID:  George [name], George [name] 1961/02/05, MRN [id number] Cardiologist: Diannah SHAUNNA Maywood, MD Cardiology APP:  Johnson Laymon HERO, PA-C { : History of Present Illness:    ALVAR MALINOSKI is a 63 y.o. male  with past medical history of CAD (s/p CABG in 2014 with LIMA-LAD, Radial-RPDA, SVG-OM1 and SVG-OM2, stent to LCx in 2016, cath in 01/2022 showing 2/4 patent bypass grafts with patent LIMA-LAD and patent radial-PDA with SVG-OM1-OM2 known to be occluded and filling by left to left collaterals with chronic appearing occlusion of LCx and medical management recommended), Stage 2-3 CKD, HTN, HLD, Type 2 DM and prior tobacco use who presents to the office today for 8-month follow-up.  He was last examined by Dr. Mallipeddi in 04/2024 and reported still having frequent chest pain which would wake him up from sleep at times but also occur with activity. Imdur  was further increased to 120 mg twice daily and he was continued on ASA 81 mg daily, Amlodipine  10 mg daily, Jardiance 25 mg daily, Fenofibrate  160 mg daily, Lopressor  100 mg twice daily, Ranexa  1000 mg twice daily and Entresto  49-51 mg twice daily.  Follow-up labs did show his LDL was elevated at 91 and Dr. Malllipeddi recommended starting Repatha. He did not wish to start any medications at that time and wanted to review at his upcoming visit.  In talking with the patient and his wife today, he reports still having frequent episodes of chest pain which occur on a daily basis. Reports this has been occurring for the past 2.5 years since his last heart catheterization.  Symptoms typically occur with activity and resolve with 1-2 SL NTG. No change in frequency or severity of symptoms. He strongly wishes to avoid a repeat cardiac catheterization given that medical management was recommended at the time of his most recent catheterization. He also wishes to avoid additional medications for cholesterol as he feels that low  cholesterol would cause more harm and risk of dementia. We reviewed the importance of reaching goal LDL given his significant CAD. He is scheduled for follow-up labs with his PCP next week. Breathing has overall been stable and no specific orthopnea, PND or pitting edema.  Studies Reviewed:   EKG: EKG is not ordered today.  Echocardiogram: 01/2022   1. Left ventricular ejection fraction, by estimation, is 55 to 60%. The  left ventricle has normal function. The left ventricle has no regional  wall motion abnormalities. There is mild left ventricular hypertrophy.  Left ventricular diastolic parameters  were normal.   2. Right ventricular systolic function is normal. The right ventricular  size is normal. Tricuspid regurgitation signal is inadequate for assessing  PA pressure.   3. The mitral valve is normal in structure. No evidence of mitral valve  regurgitation. No evidence of mitral stenosis.   4. The aortic valve is tricuspid. There is mild calcification of the  aortic valve. There is mild thickening of the aortic valve. Aortic valve  regurgitation is not visualized. Aortic valve sclerosis is present, with  no evidence of aortic valve stenosis.   Cardiac Catheterization: 01/2022   Mid LAD lesion is 50% stenosed.   Mid RCA lesion is 100% stenosed.   Prox Graft lesion before 1st Mrg  is 100% stenosed.   Ost Cx to Mid Cx lesion is 100% stenosed.   Ost LM to Mid LM lesion is 60% stenosed.   Dist LAD-1 lesion is 50% stenosed.  Dist LAD-2 lesion is 90% stenosed.   Prox LAD lesion is 80% stenosed.   Left radial artery graft was visualized by angiography and is normal in caliber.   SVG graft was not injected.   LIMA graft was visualized by angiography and is normal in caliber.   The graft exhibits no disease.   The graft exhibits no disease.   Severe three vessel CAD s/p 4V CABG with 2/4 patent bypass grafts Moderate distal left main stenosis Severe mid LAD stenosis. The mid and  distal LAD fills from the patent LIMA graft to the mid LAD. The mid and distal LAD is diffusely diseased.  The Circumflex is now occluded at the ostium. The sequential vein graft to the first and second obtuse marginal branches is known to be occluded. Several obtuse marginal branches are seen to fill from left to left collaterals. The flush ostial occlusion of the Circumflex is chronic and not favorable for PCI.  The RCA is a large dominant vessel with known 100% mid occlusion. The distal branches fill from the patent free radial artery graft to the PDA.  Elevated LV filling pressure.    Recommendation: No good options for PCI. His Circumflex is now occluded. This appears to be chronic. His troponin level is not elevated and he reports chest pain for over a year. Will maximize anti-anginal therapy. Will resume Imdur  and will add Ranexa . IV lasix  today post cath with elevated LVEDP. Monitor on telemetry overnight.   Physical Exam:   VS:  BP 138/74   Pulse 70   Ht 5' 10 (1.778 m)   Wt 235 lb 3.2 oz (106.7 kg)   SpO2 97%   BMI 33.75 kg/m    Wt Readings from Last 3 Encounters:  07/17/24 235 lb 3.2 oz (106.7 kg)  04/15/24 231 lb 9.6 oz (105.1 kg)  12/09/23 235 lb 9.6 oz (106.9 kg)     GEN: Well nourished, well developed male appearing in no acute distress NECK: No JVD; No carotid bruits CARDIAC: RRR, no murmurs, rubs, gallops RESPIRATORY:  Clear to auscultation without rales, wheezing or rhonchi  ABDOMEN: Appears non-distended. No obvious abdominal masses. EXTREMITIES: No clubbing or cyanosis. No pitting edema.  Distal pedal pulses are 2+ bilaterally.   Assessment and Plan:   1. Atherosclerosis of native coronary artery of native heart with stable angina pectoris (HCC) - Most recent cardiac catheterization in 01/2022 as outlined above showed 2 out of 4 patent grafts with occluded LCx and the sequential vein graft to the OM1-OM2 was noted to be occluded as well. There were no favorable  options for PCI and medical management was recommended. - He has stable angina with no acute changes in this. He does utilize sublingual nitroglycerin  on a daily basis. We reviewed the option of possible repeat cardiac catheterization but he wishes to hold off on this unless symptoms progress. Reviewed warning signs which would warrant ED evaluation. Unfortunately, antianginal therapy cannot be further titrated as he is already on Amlodipine  10 mg daily, Imdur  120 mg twice daily, Lopressor  100mg  BID and Ranexa  1000 mg twice daily. Will continue these along with Entresto  49-51 mg twice daily and ASA 81mg  daily.   2. Hyperlipidemia LDL goal <55 - FLP in 01/2024 showed total cholesterol 191, triglycerides 426, HDL 29 and LDL 91. Was recommended to start Repatha but he wanted to hold off at that time. He is due for follow-up labs in a few weeks and wishes to hold off on additional medication changes  until these are obtained. If LDL remains above goal, we reviewed the option of adding Zetia or potential PCSK9 inhibitor therapy. He seems very reluctant to add additional medications as he feels like lowering his cholesterol significantly will lead to risk of dementia. We reviewed that goal LDL is less than 55 given his significant CAD.   3. Essential hypertension, benign - BP is at 138/74 during today's visit and he reports BP has overall been well-controlled when checked at home. Continue current medical therapy with Amlodipine  10 mg daily, Entresto  49-51 mg twice daily, Imdur  120 mg twice daily and Lopressor  100 mg twice daily.  4. CKD (chronic kidney disease) stage 2, GFR 60-89 ml/min - His creatinine was at 1.66 when checked in 01/2024 which is close to his known baseline. Scheduled for follow-up labs with his PCP later this month.    Signed, Laymon CHRISTELLA Qua, PA-C

## 2024-07-17 ENCOUNTER — Telehealth: Payer: Self-pay | Admitting: Internal Medicine

## 2024-07-17 ENCOUNTER — Encounter: Payer: Self-pay | Admitting: Student

## 2024-07-17 ENCOUNTER — Ambulatory Visit: Payer: MEDICAID | Attending: Student | Admitting: Student

## 2024-07-17 VITALS — BP 138/74 | HR 70 | Ht 70.0 in | Wt 235.2 lb

## 2024-07-17 DIAGNOSIS — I10 Essential (primary) hypertension: Secondary | ICD-10-CM | POA: Diagnosis present

## 2024-07-17 DIAGNOSIS — N182 Chronic kidney disease, stage 2 (mild): Secondary | ICD-10-CM | POA: Insufficient documentation

## 2024-07-17 DIAGNOSIS — I25118 Atherosclerotic heart disease of native coronary artery with other forms of angina pectoris: Secondary | ICD-10-CM | POA: Insufficient documentation

## 2024-07-17 DIAGNOSIS — E785 Hyperlipidemia, unspecified: Secondary | ICD-10-CM | POA: Diagnosis present

## 2024-07-17 MED ORDER — NITROGLYCERIN 0.4 MG SL SUBL: 0.4000 mg | SUBLINGUAL_TABLET | SUBLINGUAL | 2 refills | Status: DC | PRN

## 2024-07-17 MED ORDER — NITROGLYCERIN 0.4 MG SL SUBL: 0.4000 mg | SUBLINGUAL_TABLET | SUBLINGUAL | 6 refills | Status: AC | PRN

## 2024-07-17 NOTE — Patient Instructions (Signed)
 Medication Instructions:  Your physician recommends that you continue on your current medications as directed. Please refer to the Current Medication list given to you today.  *If you need a refill on your cardiac medications before your next appointment, please call your pharmacy*  Lab Work: NONE   If you have labs (blood work) drawn today and your tests are completely normal, you will receive your results only by: MyChart Message (if you have MyChart) OR A paper copy in the mail If you have any lab test that is abnormal or we need to change your treatment, we will call you to review the results.  Testing/Procedures: NONE   Follow-Up: At [facility], you and your health needs are our priority.  As part of our continuing mission to provide you with exceptional heart care, our providers are all part of one team.  This team includes your primary Cardiologist (physician) and Advanced Practice Providers or APPs (Physician Assistants and Nurse Practitioners) who all work together to provide you with the care you need, when you need it.  Your next appointment:   3 month(s)  Provider:   You may see Vishnu P Mallipeddi, MD or one of the following Advanced Practice Providers on your designated Care Team:   [place], PA-C  Scotesia [place], [place] Olivia Pavy, [place]     We recommend signing up for the patient portal called MyChart.  Sign up information is provided on this After Visit Summary.  MyChart is used to connect with patients for Virtual Visits (Telemedicine).  Patients are able to view lab/test results, encounter notes, upcoming appointments, etc.  Non-urgent messages can be sent to your provider as well.   To learn more about what you can do with MyChart, go to [email or web address].   Other Instructions Thank you for choosing [facility]!

## 2024-07-17 NOTE — Telephone Encounter (Signed)
 Pt's medication was sent to pt's pharmacy as requested. Confirmation received.

## 2024-07-17 NOTE — Telephone Encounter (Signed)
*  STAT* If patient is at the pharmacy, call can be transferred to refill team.   1. Which medications need to be refilled? (please list name of each medication and dose if known)  nitroGLYCERIN  (NITROSTAT ) 0.4 MG SL tablet  2. Which pharmacy/location (including street and city if local pharmacy) is medication to be sent to? [id number] - [place], [place] - [address]    3. Do they need a 30 day or 90 day supply?  90 day supply  Patient says the pharmacy informed her they need the doctor's approval prior to filling prescription.

## 2024-10-08 ENCOUNTER — Other Ambulatory Visit: Payer: Self-pay | Admitting: Internal Medicine

## 2024-10-20 ENCOUNTER — Ambulatory Visit: Payer: MEDICAID | Attending: Internal Medicine | Admitting: Internal Medicine

## 2024-10-20 ENCOUNTER — Encounter: Payer: Self-pay | Admitting: Internal Medicine

## 2024-10-20 VITALS — BP 165/79 | HR 55 | Ht 70.0 in | Wt 235.0 lb

## 2024-10-20 DIAGNOSIS — R011 Cardiac murmur, unspecified: Secondary | ICD-10-CM | POA: Insufficient documentation

## 2024-10-20 NOTE — Patient Instructions (Signed)
 Medication Instructions:  Your physician recommends that you continue on your current medications as directed. Please refer to the Current Medication list given to you today.  *If you need a refill on your cardiac medications before your next appointment, please call your pharmacy*  Lab Work: None If you have labs (blood work) drawn today and your tests are completely normal, you will receive your results only by: MyChart Message (if you have MyChart) OR A paper copy in the mail If you have any lab test that is abnormal or we need to change your treatment, we will call you to review the results.  Testing/Procedures: Your physician has requested that you have an echocardiogram. Echocardiography is a painless test that uses sound waves to create images of your heart. It provides your doctor with information about the size and shape of your heart and how well your heart's chambers and valves are working. This procedure takes approximately one hour. There are no restrictions for this procedure. Please do NOT wear cologne, perfume, aftershave, or lotions (deodorant is allowed). Please arrive 15 minutes prior to your appointment time.  Please note: We ask at that you not bring children with you during ultrasound (echo/ vascular) testing. Due to room size and safety concerns, children are not allowed in the ultrasound rooms during exams. Our front office staff cannot provide observation of children in our lobby area while testing is being conducted. An adult accompanying a patient to their appointment will only be allowed in the ultrasound room at the discretion of the ultrasound technician under special circumstances. We apologize for any inconvenience.   Follow-Up: At [facility], you and your health needs are our priority.  As part of our continuing mission to provide you with exceptional heart care, our providers are all part of one team.  This team includes your primary Cardiologist  (physician) and Advanced Practice Providers or APPs (Physician Assistants and Nurse Practitioners) who all work together to provide you with the care you need, when you need it.  Your next appointment:   6 month(s)  Provider:   You may see Vishnu P Mallipeddi, MD or one of the following Advanced Practice Providers on your designated Care Team:   Brittany Strad[place], PA-C  Scotesia [place], [place] Olivia Pavy, [place]     We recommend signing up for the patient portal called MyChart.  Sign up information is provided on this After Visit Summary.  MyChart is used to connect with patients for Virtual Visits (Telemedicine).  Patients are able to view lab/test results, encounter notes, upcoming appointments, etc.  Non-urgent messages can be sent to your provider as well.   To learn more about what you can do with MyChart, go to [email or web address].   Other Instructions Coronary Sinus Reducer- Research

## 2024-10-20 NOTE — Progress Notes (Signed)
 Cardiology Office Note  Date: 10/20/2024   ID: Lionardo, Haze 1961-10-31, MRN [id number]  PCP:  [facility]  Cardiologist:  Diannah SHAUNNA Maywood, MD Electrophysiologist:  None    History of Present Illness: George [name] is a 63 y.o. male known to have CAD s/p CABG (LIMA to LAD, radial to RPDA, SVG to OM1 and SVG to OM 2) in 2014, s/p LCx PCI in 2016, DM 2, HLD, stage II CKD, former tobacco abuse is here for follow-up visit.  LHC from 2023 showed patent LIMA to LAD and patent radial to RPDA and known occluded SVG grafts to OM1 and OM 2. He also had ostial LCx lesion not favorable for PCI. Medical management including antianginal therapy uptitration was recommended. He presents today for follow-up visit.    He continues to have daily chest pains despite maximal antianginal therapy.  He reports that his chest pains did not get worse since the last cath in 2023.  Does not have other symptoms of DOE, dizziness, syncope, leg swelling.   Past Medical History:  Diagnosis Date   Coronary artery disease    a. MI 2005 s/p prior RCA stenting;  b. s/p CABG x 4 (LIMA->LAD, Radial->RPDA, VG->OM1->OM2);  c. 03/2015 Cath/PCI: LAD 50p/m, LCX 99p (3.0x20 Promus Premier DES), OM2 100, RCA 165m, Radial->RPDA nl, LIMA->LAD nl, VG->OM1->OM2 100, EF 55-65%.   Dyslipidemia    History of kidney stones    Hyperlipidemia    Hypertension    Hypertriglyceridemia    Marijuana abuse    Tobacco abuse    Type II diabetes mellitus (HCC)     Past Surgical History:  Procedure Laterality Date   ANGIOPLASTY     CARDIAC CATHETERIZATION N/A 04/07/2015   Procedure: Right/Left Heart Cath and Coronary/Graft Angiography;  Surgeon: Ozell Fell, MD;  Location: [facility] INVASIVE CV LAB;  Service: Cardiovascular;  Laterality: N/A;   CARDIAC CATHETERIZATION N/A 04/07/2015   Procedure: Coronary Stent Intervention;  Surgeon: Ozell Fell, MD;  Location: [facility] INVASIVE CV LAB;  Service: Cardiovascular;  Laterality: N/A;    COLONOSCOPY N/A 06/29/2013   Procedure: COLONOSCOPY;  Surgeon: Margo LITTIE Haddock, MD;  Location: [facility] ENDO SUITE;  Service: Endoscopy;  Laterality: N/A;  10:45 AM   CORONARY ANGIOPLASTY     CORONARY ARTERY BYPASS GRAFT N/A 12/31/2012   Procedure: CORONARY ARTERY BYPASS GRAFTING (CABG);  Surgeon: Elspeth JAYSON Millers, MD;  Location: [facility] OR;  Service: Open Heart Surgery;  Laterality: N/A;  times four using left internal mammary artery, left radial artery, endoscopically harvested right saphenous vein   CORONARY STENT PLACEMENT  2005   LAD50/70, CFX OK, PL1 90, PL2 40, RCA 95->0 with 2.5 x 60 mm Taxus stent, EF 60   LEFT HEART CATH AND CORS/GRAFTS ANGIOGRAPHY N/A 01/19/2022   Procedure: LEFT HEART CATH AND CORS/GRAFTS ANGIOGRAPHY;  Surgeon: Verlin Lonni BIRCH, MD;  Location: [facility] INVASIVE CV LAB;  Service: Cardiovascular;  Laterality: N/A;   LEFT HEART CATHETERIZATION WITH CORONARY ANGIOGRAM N/A 12/30/2012   Procedure: LEFT HEART CATHETERIZATION WITH CORONARY ANGIOGRAM;  Surgeon: Maude JAYSON Emmer, MD;  Location: [facility] CATH LAB;  Service: Cardiovascular;  Laterality: N/A;   LUMBAR SPINE SURGERY  2005   PERCUTANEOUS CORONARY STENT INTERVENTION (PCI-S)  04/07/2015   native left circumfles with DES   RADIAL ARTERY HARVEST Left 12/31/2012   Procedure: RADIAL ARTERY HARVEST;  Surgeon: Elspeth JAYSON Millers, MD;  Location: [facility] OR;  Service: Open Heart Surgery;  Laterality: Left;    Current Outpatient Medications  Medication Sig Dispense Refill   amLODipine  (NORVASC ) 10 MG tablet Take 1 tablet (10 mg total) by mouth daily. 90 tablet 3   aspirin  EC 81 MG tablet Take 1 tablet (81 mg total) by mouth daily.     atorvastatin  (LIPITOR) 80 MG tablet Take 40 mg by mouth daily.     ENTRESTO  49-51 MG TAKE 1 TABLET BY MOUTH TWICE DAILY 180 tablet 2   fenofibrate  160 MG tablet Take 160 mg by mouth daily.     glimepiride (AMARYL) 4 MG tablet Take 4 mg by mouth daily.     isosorbide  mononitrate (IMDUR ) 120 MG 24 hr tablet Take  1 tablet (120 mg total) by mouth in the morning and at bedtime. 180 tablet 3   JARDIANCE 25 MG TABS tablet Take 25 mg by mouth daily.     LANTUS  SOLOSTAR 100 UNIT/ML Solostar Pen Inject 12 Units into the skin daily.     metFORMIN  (GLUCOPHAGE ) 1000 MG tablet Take 1 tablet (1,000 mg total) by mouth 2 (two) times daily with a meal.     metoprolol  tartrate (LOPRESSOR ) 100 MG tablet Take 100 mg by mouth 2 (two) times daily.     nitroGLYCERIN  (NITROSTAT ) 0.4 MG SL tablet Place 1 tablet (0.4 mg total) under the tongue every 5 (five) minutes as needed for chest pain. 50 tablet 6   ranolazine  (RANEXA ) 1000 MG SR tablet Take 1 tablet (1,000 mg total) by mouth 2 (two) times daily. 180 tablet 3   No current facility-administered medications for this visit.   Allergies:  Semaglutide   Social History: The patient  reports that he quit smoking about 20 years ago. His smoking use included cigars and cigarettes. He started smoking about 30 years ago. He has a 20 pack-year smoking history. He has never used smokeless tobacco. He reports that he does not currently use drugs after having used the following drugs: Marijuana. He reports that he does not drink alcohol.   Family History: The patient's family history includes Cancer in his father; Diabetes in his mother; Heart disease in his mother; Prostate cancer in his father.   ROS:  Please see the history of present illness. Otherwise, complete review of systems is positive for none.  All other systems are reviewed and negative.   Physical Exam: VS:  There were no vitals taken for this visit., BMI There is no height or weight on file to calculate BMI.  Wt Readings from Last 3 Encounters:  07/17/24 235 lb 3.2 oz (106.7 kg)  04/15/24 231 lb 9.6 oz (105.1 kg)  12/09/23 235 lb 9.6 oz (106.9 kg)    General: Patient appears comfortable at rest. HEENT: Conjunctiva and lids normal, oropharynx clear with moist mucosa. Neck: Supple, no elevated JVP or carotid bruits,  no thyromegaly. Lungs: Clear to auscultation, nonlabored breathing at rest. Cardiac: Regular rate and rhythm, no S3 or significant systolic murmur, no pericardial rub. Abdomen: Soft, nontender, no hepatomegaly, bowel sounds present, no guarding or rebound. Extremities: No pitting edema, distal pulses 2+. Skin: Warm and dry. Musculoskeletal: No kyphosis. Neuropsychiatric: Alert and oriented x3, affect grossly appropriate.  Recent Labwork: No results found for requested labs within last 365 days.     Component Value Date/Time   CHOL 137 01/20/2022 0147   TRIG 176 (H) 01/20/2022 0147   HDL 31 (L) 01/20/2022 0147   CHOLHDL 4.4 01/20/2022 0147   VLDL 35 01/20/2022 0147   LDLCALC 71 01/20/2022 0147    Assessment and Plan:  #  CAD s/p CABG (LIMA to LAD, radial to RPDA, SVG to OM1 and SVG to OM 2) in 2014, s/p LCx PCI in 2016 with normal LVEF, currently has refractory angina - Currently has daily chest pains and multiple times throughout the day.  Sometimes wakes him up from sleep, resolves with SL NTG.  Chest pains did not get worse since LHC in 2023.  Discussed upper initiating opiates but he is not interested at this time.  ER precautions for chest pain provided.  Breathing exercises recommended. - LHC from 2023 showed patent LIMA to LAD, patent radial to RPDA, known occluded SVG grafts to OM1, OM 2, ostial LCx lesion was not favorable for PCI.  Medical management was recommended at that time.  - Continue cardioprotective medications, aspirin  81 mg once daily, atorvastatin  40 mg nightly, fenofibrate  160 mg once daily.  Not taking Lovaza  anymore due to cost.  # Cardiac murmur - Obtain echocardiogram.  # HTN, controlled: Continue above medications. Continue Entresto  49-51 mg twice daily.  # HLD, not at goal # Hypertriglyceridemia, not at goal - Continue atorvastatin  40 mg nightly, fenofibrate  160 mg once daily.  Not taking Lovaza  anymore due to cost.  Not interested to take any  injectables.  Lipid panel from June 2025 reviewed, TG 426 and LDL 191.     Disposition:  Follow up 6 months  Signed, Fount Bahe Arleta Maywood, MD, 10/20/2024 9:30 AM    [facility] [address] [place], [place] [address]

## 2024-11-23 ENCOUNTER — Ambulatory Visit: Payer: Self-pay | Admitting: Internal Medicine

## 2024-11-23 ENCOUNTER — Ambulatory Visit (HOSPITAL_COMMUNITY)
Admission: RE | Admit: 2024-11-23 | Discharge: 2024-11-23 | Disposition: A | Discharge status: Home / Self Care | Payer: MEDICAID | Source: Ambulatory Visit | Attending: Internal Medicine | Admitting: Internal Medicine

## 2024-11-23 DIAGNOSIS — R011 Cardiac murmur, unspecified: Secondary | ICD-10-CM | POA: Diagnosis present

## 2024-11-23 LAB — ECHOCARDIOGRAM COMPLETE
AR max vel: 1.92 cm2
AV Area VTI: 1.78 cm2
AV Area mean vel: 1.71 cm2
AV Mean grad: 11 mmHg
AV Peak grad: 21 mmHg
Ao pk vel: 2.29 m/s
Area-P 1/2: 3.72 cm2
Calc EF: 63.2 %
S' Lateral: 3.1 cm
Single Plane A2C EF: 59.6 %
Single Plane A4C EF: 64.1 %

## 2024-11-23 NOTE — Progress Notes (Signed)
*  PRELIMINARY RESULTS* Echocardiogram 2D Echocardiogram has been performed.  George [name] 11/23/2024, 10:31 AM

## 2024-12-16 ENCOUNTER — Other Ambulatory Visit: Payer: Self-pay | Admitting: Internal Medicine
